# Patient Record
Sex: Female | Born: 1935 | Race: White | Hispanic: No | Smoking: Never smoker
Health system: Southern US, Community
[De-identification: ages and names within clinical notes are randomized; demographics above are authoritative.]

## PROBLEM LIST (undated history)

## (undated) DIAGNOSIS — E7800 Pure hypercholesterolemia, unspecified: Secondary | ICD-10-CM

## (undated) DIAGNOSIS — M1990 Unspecified osteoarthritis, unspecified site: Secondary | ICD-10-CM

## (undated) DIAGNOSIS — I10 Essential (primary) hypertension: Secondary | ICD-10-CM

## (undated) HISTORY — PX: EYE SURGERY: SHX253

## (undated) HISTORY — PX: ABDOMINAL HYSTERECTOMY: SHX81

## (undated) HISTORY — PX: CARDIAC CATHETERIZATION: SHX172

---

## 2000-07-12 ENCOUNTER — Encounter: Payer: Self-pay | Admitting: Emergency Medicine

## 2000-07-12 ENCOUNTER — Ambulatory Visit (HOSPITAL_COMMUNITY): Admission: AD | Admit: 2000-07-12 | Discharge: 2000-07-12 | Payer: Self-pay | Admitting: *Deleted

## 2002-10-05 ENCOUNTER — Other Ambulatory Visit: Admission: RE | Admit: 2002-10-05 | Discharge: 2002-10-05 | Payer: Self-pay | Admitting: Family Medicine

## 2003-10-26 ENCOUNTER — Other Ambulatory Visit: Admission: RE | Admit: 2003-10-26 | Discharge: 2003-10-26 | Payer: Self-pay | Admitting: *Deleted

## 2005-11-19 ENCOUNTER — Other Ambulatory Visit: Admission: RE | Admit: 2005-11-19 | Discharge: 2005-11-19 | Payer: Self-pay | Admitting: *Deleted

## 2011-12-27 ENCOUNTER — Encounter (HOSPITAL_COMMUNITY): Payer: Self-pay | Admitting: *Deleted

## 2011-12-27 ENCOUNTER — Emergency Department (HOSPITAL_COMMUNITY)
Admission: EM | Admit: 2011-12-27 | Discharge: 2011-12-27 | Disposition: A | Discharge status: Home / Self Care | Payer: Medicare Other | Attending: Emergency Medicine | Admitting: Emergency Medicine

## 2011-12-27 DIAGNOSIS — I10 Essential (primary) hypertension: Secondary | ICD-10-CM | POA: Insufficient documentation

## 2011-12-27 DIAGNOSIS — R002 Palpitations: Secondary | ICD-10-CM | POA: Insufficient documentation

## 2011-12-27 DIAGNOSIS — E7800 Pure hypercholesterolemia, unspecified: Secondary | ICD-10-CM | POA: Insufficient documentation

## 2011-12-27 HISTORY — DX: Pure hypercholesterolemia, unspecified: E78.00

## 2011-12-27 HISTORY — DX: Essential (primary) hypertension: I10

## 2011-12-27 LAB — CBC WITH DIFFERENTIAL/PLATELET
Basophils Absolute: 0.1 K/uL (ref 0.0–0.1)
Basophils Relative: 1 % (ref 0–1)
Eosinophils Absolute: 0.1 K/uL (ref 0.0–0.7)
Eosinophils Relative: 1 % (ref 0–5)
HCT: 37.7 % (ref 36.0–46.0)
Hemoglobin: 12.6 g/dL (ref 12.0–15.0)
Lymphocytes Relative: 21 % (ref 12–46)
Lymphs Abs: 1.7 K/uL (ref 0.7–4.0)
MCH: 29.3 pg (ref 26.0–34.0)
MCHC: 33.4 g/dL (ref 30.0–36.0)
MCV: 87.7 fL (ref 78.0–100.0)
Monocytes Absolute: 0.5 K/uL (ref 0.1–1.0)
Monocytes Relative: 6 % (ref 3–12)
Neutro Abs: 5.6 K/uL (ref 1.7–7.7)
Neutrophils Relative %: 71 % (ref 43–77)
Platelets: 333 K/uL (ref 150–400)
RBC: 4.3 MIL/uL (ref 3.87–5.11)
RDW: 13.3 % (ref 11.5–15.5)
WBC: 7.8 K/uL (ref 4.0–10.5)

## 2011-12-27 LAB — URINALYSIS, ROUTINE W REFLEX MICROSCOPIC
Bilirubin Urine: NEGATIVE
Glucose, UA: NEGATIVE mg/dL
Ketones, ur: NEGATIVE mg/dL
Nitrite: NEGATIVE
Protein, ur: NEGATIVE mg/dL
pH: 7 (ref 5.0–8.0)

## 2011-12-27 LAB — BASIC METABOLIC PANEL
Chloride: 105 mEq/L (ref 96–112)
GFR calc Af Amer: 90 mL/min — ABNORMAL LOW (ref >90)
GFR calc non Af Amer: 77 mL/min — ABNORMAL LOW (ref >90)
Potassium: 3.5 mEq/L (ref 3.5–5.1)
Sodium: 141 mEq/L (ref 135–145)

## 2011-12-27 LAB — URINE MICROSCOPIC-ADD ON

## 2011-12-27 LAB — TROPONIN I
Troponin I: <0.3 ng/mL (ref <0.30)
Troponin I: <0.3 ng/mL

## 2011-12-27 LAB — MAGNESIUM: Magnesium: 2.2 mg/dL (ref 1.5–2.5)

## 2011-12-27 MED ORDER — ASPIRIN 81 MG PO CHEW
162.0000 mg | CHEWABLE_TABLET | Freq: Once | ORAL | Status: AC
  Administered 2011-12-27: 162 mg via ORAL
  Filled 2011-12-27: qty 2

## 2011-12-27 MED ORDER — GI COCKTAIL ~~LOC~~
30.0000 mL | Freq: Once | ORAL | Status: AC
  Administered 2011-12-27: 30 mL via ORAL
  Filled 2011-12-27: qty 30

## 2011-12-27 MED ORDER — SODIUM CHLORIDE 0.9 % IV SOLN
Freq: Once | INTRAVENOUS | Status: AC
  Administered 2011-12-27: 12:00:00 via INTRAVENOUS

## 2011-12-27 NOTE — ED Provider Notes (Signed)
History     CSN: [id number]  Arrival date & time 12/27/11  1035   First MD Initiated Contact with Patient 12/27/11 1101      Chief Complaint  Patient presents with  . Palpitations  . Gastrophageal Reflux    (Consider location/radiation/quality/duration/timing/severity/associated sxs/prior treatment) HPI Comments: Pt comes in with cc of palpitations. Pt has no known CAD hx, hx of HTN. PT reports that this am since this am she has been having some occasional palpitations. There is no associated dizziness, sob, n/v/f/c/chest pain/sob. Pt states that in the past when she had simiar sx, she was admitted, had a CATH - which was negative, and then sent home with dx of GERD. She has no heart burn, GERD like sx at this time.   Patient is a 78 y.o. female presenting with palpitations and GERD. The history is provided by the patient.  Palpitations  Pertinent negatives include no chest pain, no abdominal pain, no nausea, no vomiting, no headaches and no shortness of breath.  Gastrophageal Reflux Pertinent negatives include no chest pain, no abdominal pain, no headaches and no shortness of breath.    Past Medical History  Diagnosis Date  . Hypertension   . High cholesterol     Past Surgical History  Procedure Date  . Eye surgery   . Abdominal hysterectomy   . Cardiac catheterization     History reviewed. No pertinent family history.  History  Substance Use Topics  . Smoking status: Never Smoker   . Smokeless tobacco: Never Used  . Alcohol Use: No    OB History    Grav Para Term Preterm Abortions TAB SAB Ect Mult Living                  Review of Systems  Constitutional: Positive for activity change.  HENT: Negative for neck pain.   Respiratory: Negative for shortness of breath.   Cardiovascular: Positive for palpitations. Negative for chest pain.  Gastrointestinal: Negative for nausea, vomiting and abdominal pain.  Genitourinary: Negative for dysuria.    Neurological: Negative for headaches.    Allergies  Review of patient's allergies indicates no known allergies.  Home Medications   Current Outpatient Rx  Name Route Sig Dispense Refill  . ALENDRONATE SODIUM 70 MG PO TABS Oral Take 70 mg by mouth every 7 (seven) days. Take with a full glass of water on an empty stomach. Saturday    . ASPIRIN 81 MG PO TABS Oral Take 81 mg by mouth daily.    Marland Kitchen CALCIUM + D3 PO Oral Take 1 tablet by mouth daily.    Marland Kitchen LISINOPRIL 10 MG PO TABS Oral Take 10 mg by mouth daily.    Marland Kitchen LOVASTATIN 20 MG PO TABS Oral Take 20 mg by mouth at bedtime.    Marland Kitchen METOPROLOL TARTRATE 50 MG PO TABS Oral Take 50 mg by mouth 2 (two) times daily.    . ADULT MULTIVITAMIN W/MINERALS CH Oral Take 1 tablet by mouth daily.    Marland Kitchen VITAMIN C 500 MG PO TABS Oral Take 500 mg by mouth daily.    Marland Kitchen VITAMIN E 400 UNITS PO CAPS Oral Take 400 Units by mouth daily.      BP 192/77  Pulse 70  Temp 98 F (36.7 C) (Oral)  Resp 18  Ht 5\' 3"  (1.6 m)  Wt 150 lb (68.04 kg)  BMI 26.57 kg/m2  SpO2 98%  Physical Exam  Nursing note and vitals reviewed. Constitutional: She is oriented to  person, place, and time. She appears well-developed and well-nourished.  HENT:  Head: Normocephalic and atraumatic.  Eyes: EOM are normal. Pupils are equal, round, and reactive to light.  Neck: Neck supple.  Cardiovascular: Normal rate and normal heart sounds.   No murmur heard.      Occasional dropped beat.  Pulmonary/Chest: Effort normal. No respiratory distress.  Abdominal: Soft. She exhibits no distension. There is no tenderness. There is no rebound and no guarding.  Neurological: She is alert and oriented to person, place, and time.  Skin: Skin is warm and dry.    ED Course  Procedures (including critical care time)  Labs Reviewed  URINALYSIS, ROUTINE W REFLEX MICROSCOPIC - Abnormal; Notable for the following:    Leukocytes, UA SMALL (*)     All other components within normal limits  BASIC  METABOLIC PANEL - Abnormal; Notable for the following:    Glucose, Bld 103 (*)     GFR calc non Af Amer 77 (*)     GFR calc Af Amer 90 (*)     All other components within normal limits  CBC WITH DIFFERENTIAL  TROPONIN I  URINE MICROSCOPIC-ADD ON  MAGNESIUM  TROPONIN I   No results found.   No diagnosis found.    MDM   Date: 12/27/2011  Rate: 71  Rhythm: normal sinus rhythm  QRS Axis: normal  Intervals: normal  ST/T Wave abnormalities: normal  Conduction Disutrbances:none  Narrative Interpretation:   Old EKG Reviewed: unchanged  Pt comes in with cc of palpitations. Asymptomatic. Hx not suggestive of any sympathomimetics on board, no new meds, and she has no associated sx. We will get basic cardiac labs, trops x 2, ekg x 2. Suggested to patient that she needs to see her pcp if labs are normal and she has persistent sx for possible holter monitoring.  While in the patient will be on cardiac monitoring, and we will see if anything abnormal is picked up.             Derwood Kaplan, MD 12/27/11 1552

## 2011-12-27 NOTE — Progress Notes (Signed)
Pt states pcp is candace [employer]

## 2011-12-27 NOTE — ED Notes (Signed)
Okay to draw 2nd Troponin at 1430 per Dr. Ileana Ladd with phlebotomy also aware.

## 2011-12-27 NOTE — ED Notes (Signed)
MD at bedside. 

## 2011-12-27 NOTE — ED Notes (Signed)
1st BMP, Magnesium and Troponin sample clotted, phlebotomy at bedside now to redraw sample, will monitor.

## 2011-12-27 NOTE — ED Notes (Signed)
Pt from home with reports of feeling like heart was fluttering with an indigestion type feeling that started around 0800 this morning that subsided about 30 minutes ago. Pt denies N/V, dizziness or shortness of breath. Pt also reports similar episode in 2002 with a negative heart cath performed at [facility]. Upon placing pt on cardiac monitor, NSR was noted, rate in the 60s and 70s.

## 2013-12-01 ENCOUNTER — Ambulatory Visit (INDEPENDENT_AMBULATORY_CARE_PROVIDER_SITE_OTHER): Payer: Medicare Other | Admitting: Family Medicine

## 2013-12-01 ENCOUNTER — Ambulatory Visit (INDEPENDENT_AMBULATORY_CARE_PROVIDER_SITE_OTHER): Payer: Medicare Other

## 2013-12-01 VITALS — BP 128/80 | HR 74 | Temp 98.4°F | Resp 16 | Ht 64.0 in | Wt 153.6 lb

## 2013-12-01 DIAGNOSIS — M79609 Pain in unspecified limb: Secondary | ICD-10-CM

## 2013-12-01 DIAGNOSIS — B351 Tinea unguium: Secondary | ICD-10-CM

## 2013-12-01 DIAGNOSIS — M79671 Pain in right foot: Secondary | ICD-10-CM

## 2013-12-01 DIAGNOSIS — M659 Synovitis and tenosynovitis, unspecified: Secondary | ICD-10-CM

## 2013-12-01 NOTE — Progress Notes (Signed)
Subjective:    Patient ID: Sylvia Petersen, female    DOB: 11-14-1929, 80 y.o.   MRN: [id number]  HPI Sylvia Petersen is a 80 y.o. female  R foot pain, past few days - top of foot. NKI, no known new activities, but did wear new shoes last week. Hurts to push foot down or lift up.   Had great toenail removed last year, ingrown and thickened.  Nail came back thickened again. Occasional soreness and trouble with keeping nail cut.   Tx: none.    There are no active problems to display for this patient.  Past Medical History  Diagnosis Date  . Hypertension   . High cholesterol    Past Surgical History  Procedure Laterality Date  . Eye surgery    . Abdominal hysterectomy    . Cardiac catheterization     No Known Allergies Prior to Admission medications   Medication Sig Start Date End Date Taking? Authorizing Provider  alendronate (FOSAMAX) 70 MG tablet Take 70 mg by mouth every 7 (seven) days. Take with a full glass of water on an empty stomach. Saturday   Yes Historical Provider, MD  amLODipine (NORVASC) 2.5 MG tablet Take 2.5 mg by mouth daily.   Yes Historical Provider, MD  aspirin 81 MG tablet Take 81 mg by mouth daily.   Yes Historical Provider, MD  Calcium Carb-Cholecalciferol (CALCIUM + D3 PO) Take 1 tablet by mouth daily.   Yes Historical Provider, MD  lisinopril (PRINIVIL,ZESTRIL) 10 MG tablet Take 20 mg by mouth daily.    Yes Historical Provider, MD  lovastatin (MEVACOR) 20 MG tablet Take 20 mg by mouth at bedtime.   Yes Historical Provider, MD  metoprolol (LOPRESSOR) 50 MG tablet Take 50 mg by mouth 2 (two) times daily.   Yes Historical Provider, MD  Multiple Vitamin (MULTIVITAMIN WITH MINERALS) TABS Take 1 tablet by mouth daily.   Yes Historical Provider, MD  vitamin C (ASCORBIC ACID) 500 MG tablet Take 500 mg by mouth daily.   Yes Historical Provider, MD  vitamin E (VITAMIN E) 400 UNIT capsule Take 400 Units by mouth daily.   Yes Historical Provider, MD   History    Social History  . Marital Status: Widowed    Spouse Name: N/A    Number of Children: N/A  . Years of Education: N/A   Occupational History  . Not on file.   Social History Main Topics  . Smoking status: Never Smoker   . Smokeless tobacco: Never Used  . Alcohol Use: No  . Drug Use: No  . Sexual Activity: Not on file   Other Topics Concern  . Not on file   Social History Narrative  . No narrative on file     Review of Systems  Constitutional: Negative for fever and chills.  Respiratory: Negative for chest tightness.   Musculoskeletal: Positive for myalgias (foot only.  no new calf pain, or calf swelling. ).  Skin: Negative for rash and wound.       Objective:   Physical Exam  Vitals reviewed. Constitutional: She is oriented to person, place, and time. She appears well-developed and well-nourished. No distress.  HENT:  Head: Normocephalic and atraumatic.  Pulmonary/Chest: Effort normal.  Musculoskeletal:       Right foot: She exhibits tenderness.       Feet:  Neurological: She is alert and oriented to person, place, and time.  Skin: Skin is warm and dry. No rash noted.  Psychiatric: She has  a normal mood and affect. Her behavior is normal.   Filed Vitals:   12/01/13 1143  BP: 128/80  Pulse: 74  Temp: 98.4 F (36.9 C)  TempSrc: Oral  Resp: 16  Height: 5\' 4"  (1.626 m)  Weight: 153 lb 9.6 oz (69.673 kg)  SpO2: 98%   [facility] reading (PRIMARY) by  Dr. Carlota Raspberry: R foot. Degenerative changes, no acute fracture.     Assessment & Plan:   Sylvia Petersen is a 80 y.o. female Foot pain, right - Plan: DG Foot Complete Right, Tibialis anterior tenosynovitis  -appears to be most ttp over tib anterior tendon, no rash, no apparent superficial thrombophlebitis, but in DDX. New shoes last week may be contributory.  -heat/ice topically as needed, rom/stretches, limit overuse, and tylenol otc prn.  Short term low dose advil for antiinflammatory effect if needed.  -recheck in  next 2 weeks if not improving.   Onychomycosis of right great toe   -keep appt with foot specialist tomorrow - may be removing nail +/- matrix destruction to prevent recurrence.   Meds ordered this encounter  Medications  . amLODipine (NORVASC) 2.5 MG tablet    Sig: Take 2.5 mg by mouth daily.   Patient Instructions  Keep appointment with foot specialist tomorrow for toenail evaluation.   Try heat to top of foot in morning, then ice over washcloth to area at night if needed. Tylenol is safer for pain, but occasional advil over the counter - lowest effective dose - can be used short term for inflammation.  Stretches and range of motion, then recheck in next 2 weeks if not improving.  Sooner if worse.   Tendinitis Tendinitis is swelling and inflammation of the tendons. Tendons are band-like tissues that connect muscle to bone. Tendinitis commonly occurs in the:   Shoulders (rotator cuff).  Heels (Achilles tendon).  Elbows (triceps tendon). CAUSES Tendinitis is usually caused by overusing the tendon, muscles, and joints involved. When the tissue surrounding a tendon (synovium) becomes inflamed, it is called tenosynovitis. Tendinitis commonly develops in people whose jobs require repetitive motions. SYMPTOMS  Pain.  Tenderness.  Mild swelling. DIAGNOSIS Tendinitis is usually diagnosed by physical exam. Your health care provider may also order X-rays or other imaging tests. TREATMENT Your health care provider may recommend certain medicines or exercises for your treatment. HOME CARE INSTRUCTIONS   Use a sling or splint for as long as directed by your health care provider until the pain decreases.  Put ice on the injured area.  Put ice in a plastic bag.  Place a towel between your skin and the bag.  Leave the ice on for 15-20 minutes, 3-4 times a day, or as directed by your health care provider.  Avoid using the limb while the tendon is painful. Perform gentle range of motion  exercises only as directed by your health care provider. Stop exercises if pain or discomfort increase, unless directed otherwise by your health care provider.  Only take over-the-counter or prescription medicines for pain, discomfort, or fever as directed by your health care provider. SEEK MEDICAL CARE IF:   Your pain and swelling increase.  You develop new, unexplained symptoms, especially increased numbness in the hands. MAKE SURE YOU:   Understand these instructions.  Will watch your condition.  Will get help right away if you are not doing well or get worse. Document Released: 02/24/2000 Document Revised: 07/13/2013 Document Reviewed: 05/15/2010 [facility] Patient Information 2015 [place], [place]. This information is not intended to replace advice given  to you by your health care provider. Make sure you discuss any questions you have with your health care provider.

## 2013-12-01 NOTE — Patient Instructions (Addendum)
Keep appointment with foot specialist tomorrow for toenail evaluation.   Try heat to top of foot in morning, then ice over washcloth to area at night if needed. Tylenol is safer for pain, but occasional advil over the counter - lowest effective dose - can be used short term for inflammation.  Stretches and range of motion, then recheck in next 2 weeks if not improving.  Sooner if worse.   Tendinitis Tendinitis is swelling and inflammation of the tendons. Tendons are band-like tissues that connect muscle to bone. Tendinitis commonly occurs in the:   Shoulders (rotator cuff).  Heels (Achilles tendon).  Elbows (triceps tendon). CAUSES Tendinitis is usually caused by overusing the tendon, muscles, and joints involved. When the tissue surrounding a tendon (synovium) becomes inflamed, it is called tenosynovitis. Tendinitis commonly develops in people whose jobs require repetitive motions. SYMPTOMS  Pain.  Tenderness.  Mild swelling. DIAGNOSIS Tendinitis is usually diagnosed by physical exam. Your health care provider may also order X-rays or other imaging tests. TREATMENT Your health care provider may recommend certain medicines or exercises for your treatment. HOME CARE INSTRUCTIONS   Use a sling or splint for as long as directed by your health care provider until the pain decreases.  Put ice on the injured area.  Put ice in a plastic bag.  Place a towel between your skin and the bag.  Leave the ice on for 15-20 minutes, 3-4 times a day, or as directed by your health care provider.  Avoid using the limb while the tendon is painful. Perform gentle range of motion exercises only as directed by your health care provider. Stop exercises if pain or discomfort increase, unless directed otherwise by your health care provider.  Only take over-the-counter or prescription medicines for pain, discomfort, or fever as directed by your health care provider. SEEK MEDICAL CARE IF:   Your pain and  swelling increase.  You develop new, unexplained symptoms, especially increased numbness in the hands. MAKE SURE YOU:   Understand these instructions.  Will watch your condition.  Will get help right away if you are not doing well or get worse. Document Released: 02/24/2000 Document Revised: 07/13/2013 Document Reviewed: 05/15/2010 [facility] Patient Information 2015 [place], [place]. This information is not intended to replace advice given to you by your health care provider. Make sure you discuss any questions you have with your health care provider.

## 2013-12-02 ENCOUNTER — Ambulatory Visit (INDEPENDENT_AMBULATORY_CARE_PROVIDER_SITE_OTHER): Payer: Medicare Other

## 2013-12-02 VITALS — BP 161/78 | HR 73 | Resp 16

## 2013-12-02 DIAGNOSIS — L600 Ingrowing nail: Secondary | ICD-10-CM

## 2013-12-02 DIAGNOSIS — B351 Tinea unguium: Secondary | ICD-10-CM

## 2013-12-02 DIAGNOSIS — L03039 Cellulitis of unspecified toe: Secondary | ICD-10-CM

## 2013-12-02 MED ORDER — CEPHALEXIN 500 MG PO CAPS: 500.0000 mg | ORAL_CAPSULE | Freq: Three times a day (TID) | ORAL | Status: DC

## 2013-12-02 NOTE — Progress Notes (Signed)
   Subjective:    Patient ID: Sylvia Petersen, female    DOB: 12/04/1929, 80 y.o.   MRN: [id number]  HPI Comments: "I have a bad toe"  Patient c/o aching 1st toe right, both borders for about a week. The lateral nail corner is thickened. She states that she has had this ingrown worked on before. She has been trimming at home. No relief.  Toe Pain       Review of Systems  HENT: Positive for sneezing.   Eyes: Positive for itching and visual disturbance.  All other systems reviewed and are negative.      Objective:   Physical Exam 80 year old female presents at this time with a complaint of ingrown nail the lateral border right great toe previously had partial nail procedure done temporarily. Well-developed well-nourished oriented x3 vital signs stable pedal pulses palpable DP postal for PT one over 4 mild varicosities noted neurologically epicritic and proprioceptive sensations intact and symmetric bilateral normal plantar response DTRs not elicited. Neurologically skin color pigment normal no open wounds ulcerations or prescription dose incurvation and erythema medial lateral nail folds right great toe. Left hallux unremarkable nails with thick brittle yellow discolored consistent with onychomycosis nails painful criptotic incurvated and tender remainder of exam unremarkable mild digital contractures HAV deformity noted bilateral       Assessment & Plan:  Assessment this time is ingrowing nail paronychia right hallux medial lateral border with onychomycosis at this time recommendation is permanent nail excision and phenol matricectomy local anesthetic block is administered Betadine prep was performed the medial lateral borders were excised phenol matricectomy followed on-call wash Betadine ointment and dry sterile dressing applied to the right great toe per prescription for cephalexin is given recommended Tylenol as needed for pain recheck in 2 weeks for nail check as instructed.  Next  Harriet Masson DPM

## 2013-12-02 NOTE — Patient Instructions (Signed)

## 2013-12-15 ENCOUNTER — Ambulatory Visit: Payer: Medicare Other

## 2013-12-29 ENCOUNTER — Ambulatory Visit (INDEPENDENT_AMBULATORY_CARE_PROVIDER_SITE_OTHER): Payer: Medicare Other

## 2013-12-29 VITALS — BP 156/76 | HR 76 | Resp 15

## 2013-12-29 DIAGNOSIS — Z09 Encounter for follow-up examination after completed treatment for conditions other than malignant neoplasm: Secondary | ICD-10-CM

## 2013-12-29 DIAGNOSIS — L600 Ingrowing nail: Secondary | ICD-10-CM

## 2013-12-29 NOTE — Patient Instructions (Signed)

## 2013-12-29 NOTE — Progress Notes (Signed)
   Subjective:    Patient ID: Sylvia Petersen, female    DOB: [birth year], 80 y.o.   MRN: [id number]  HPI Comments: Pt presents for follow up of right 1st toenail matrixectomy.  Pt states the toe is still tender.     Review of Systems no new findings or systemic changes no    Objective:   Physical Exam Patient is 3 weeks status post AP nail procedure mediolateral borders right great toe minimal or no pain no discomfort slight serous drainage still present slight edema and erythema consistent with postop course no ascending cellulitis no signs of infection no discharge drainage otherwise noted       Assessment & Plan:  Assessment good postop progress following AP nail procedure medial lateral border of left great of right great toe. Patient will continue with cleansing daily Neosporin Band-Aid where dry at night discharge to an as-needed basis for any future followup contact us visiting changes or exacerbations  Harriet Masson DPM

## 2015-05-26 DIAGNOSIS — L820 Inflamed seborrheic keratosis: Secondary | ICD-10-CM | POA: Diagnosis not present

## 2015-05-26 DIAGNOSIS — D692 Other nonthrombocytopenic purpura: Secondary | ICD-10-CM | POA: Diagnosis not present

## 2015-06-29 DIAGNOSIS — N760 Acute vaginitis: Secondary | ICD-10-CM | POA: Diagnosis not present

## 2015-07-04 DIAGNOSIS — E7800 Pure hypercholesterolemia, unspecified: Secondary | ICD-10-CM | POA: Diagnosis not present

## 2015-07-04 DIAGNOSIS — R634 Abnormal weight loss: Secondary | ICD-10-CM | POA: Diagnosis not present

## 2015-07-04 DIAGNOSIS — I10 Essential (primary) hypertension: Secondary | ICD-10-CM | POA: Diagnosis not present

## 2015-07-26 DIAGNOSIS — H1045 Other chronic allergic conjunctivitis: Secondary | ICD-10-CM | POA: Diagnosis not present

## 2015-08-24 DIAGNOSIS — M85852 Other specified disorders of bone density and structure, left thigh: Secondary | ICD-10-CM | POA: Diagnosis not present

## 2015-08-24 DIAGNOSIS — Z1231 Encounter for screening mammogram for malignant neoplasm of breast: Secondary | ICD-10-CM | POA: Diagnosis not present

## 2015-08-24 DIAGNOSIS — M85851 Other specified disorders of bone density and structure, right thigh: Secondary | ICD-10-CM | POA: Diagnosis not present

## 2015-09-09 DIAGNOSIS — H524 Presbyopia: Secondary | ICD-10-CM | POA: Diagnosis not present

## 2015-12-28 DIAGNOSIS — N819 Female genital prolapse, unspecified: Secondary | ICD-10-CM | POA: Diagnosis not present

## 2016-01-04 DIAGNOSIS — Z1389 Encounter for screening for other disorder: Secondary | ICD-10-CM | POA: Diagnosis not present

## 2016-01-04 DIAGNOSIS — Z0001 Encounter for general adult medical examination with abnormal findings: Secondary | ICD-10-CM | POA: Diagnosis not present

## 2016-01-04 DIAGNOSIS — I10 Essential (primary) hypertension: Secondary | ICD-10-CM | POA: Diagnosis not present

## 2016-01-04 DIAGNOSIS — K4190 Unilateral femoral hernia, without obstruction or gangrene, not specified as recurrent: Secondary | ICD-10-CM | POA: Diagnosis not present

## 2016-01-04 DIAGNOSIS — N8110 Cystocele, unspecified: Secondary | ICD-10-CM | POA: Diagnosis not present

## 2016-01-04 DIAGNOSIS — E7800 Pure hypercholesterolemia, unspecified: Secondary | ICD-10-CM | POA: Diagnosis not present

## 2016-02-08 DIAGNOSIS — K439 Ventral hernia without obstruction or gangrene: Secondary | ICD-10-CM | POA: Diagnosis not present

## 2016-03-21 DIAGNOSIS — R300 Dysuria: Secondary | ICD-10-CM | POA: Diagnosis not present

## 2016-04-02 DIAGNOSIS — R944 Abnormal results of kidney function studies: Secondary | ICD-10-CM | POA: Diagnosis not present

## 2016-04-02 DIAGNOSIS — E7800 Pure hypercholesterolemia, unspecified: Secondary | ICD-10-CM | POA: Diagnosis not present

## 2016-04-02 DIAGNOSIS — Z01810 Encounter for preprocedural cardiovascular examination: Secondary | ICD-10-CM | POA: Diagnosis not present

## 2016-04-02 DIAGNOSIS — I10 Essential (primary) hypertension: Secondary | ICD-10-CM | POA: Diagnosis not present

## 2016-04-13 ENCOUNTER — Other Ambulatory Visit: Payer: Self-pay | Admitting: Obstetrics and Gynecology

## 2016-04-17 NOTE — Patient Instructions (Addendum)
Your procedure is scheduled on:  Wednesday, Feb. 14, 2018  Enter through the [employer] [facility] at:  7:00 AM  Pick up the phone at the desk and dial [phone].  Call this number if you have problems the morning of surgery: [phone].  Remember: Do NOT eat food or drink after:  Midnight Tuesday  Take these medicines the morning of surgery with a SIP OF WATER:  Amlodipine, Metoprolol  Stop ALL herbal medications, Vitamin C and Vitamin E at this time  Do NOT smoke the day of surgery.  Do NOT wear jewelry (body piercing), metal hair clips/bobby pins, make-up, or nail polish. Do NOT wear lotions, powders, or perfumes.  You may wear deodorant. Do NOT shave for 48 hours prior to surgery. Do NOT bring valuables to the hospital. Contacts, dentures, or bridgework may not be worn into surgery.  Leave suitcase in car.  After surgery it may be brought to your room.  For patients admitted to the hospital, checkout time is 11:00 AM the day of discharge.  Have a responsible adult drive you home and stay with you for 24 hours after your procedure  Bring a copy of your healthcare power of attorney and living will documents.  **Effective Friday, Jan. 12, 2018, [facility] will implement no hospital visitations from children age 33 and younger due to a steady increase in flu activity in our community and hospitals. **

## 2016-04-18 ENCOUNTER — Encounter (HOSPITAL_COMMUNITY): Payer: Self-pay

## 2016-04-18 ENCOUNTER — Encounter (HOSPITAL_COMMUNITY)
Admission: RE | Admit: 2016-04-18 | Discharge: 2016-04-18 | Disposition: A | Discharge status: Home / Self Care | Payer: PPO | Source: Ambulatory Visit | Attending: Obstetrics and Gynecology | Admitting: Obstetrics and Gynecology

## 2016-04-18 DIAGNOSIS — Z9889 Other specified postprocedural states: Secondary | ICD-10-CM | POA: Diagnosis not present

## 2016-04-18 DIAGNOSIS — Z01812 Encounter for preprocedural laboratory examination: Secondary | ICD-10-CM | POA: Insufficient documentation

## 2016-04-18 HISTORY — DX: Unspecified osteoarthritis, unspecified site: M19.90

## 2016-04-18 LAB — CBC
HCT: 33.6 % — ABNORMAL LOW (ref 36.0–46.0)
Hemoglobin: 11 g/dL — ABNORMAL LOW (ref 12.0–15.0)
MCH: 28.7 pg (ref 26.0–34.0)
MCHC: 32.7 g/dL (ref 30.0–36.0)
MCV: 87.7 fL (ref 78.0–100.0)
PLATELETS: 265 10*3/uL (ref 150–400)
RBC: 3.83 MIL/uL — ABNORMAL LOW (ref 3.87–5.11)
RDW: 13.4 % (ref 11.5–15.5)
WBC: 6.5 10*3/uL (ref 4.0–10.5)

## 2016-04-24 NOTE — Anesthesia Preprocedure Evaluation (Addendum)
Anesthesia Evaluation  Patient identified by MRN, date of birth, ID band Patient awake    Reviewed: Allergy & Precautions, H&P , Patient's Chart, lab work & pertinent test results, reviewed documented beta blocker date and time   Airway Mallampati: II  TM Distance: >3 FB Neck ROM: full    Dental no notable dental hx.    Pulmonary neg pulmonary ROS,    Pulmonary exam normal breath sounds clear to auscultation       Cardiovascular hypertension, Pt. on medications and Pt. on home beta blockers negative cardio ROS   Rhythm:regular Rate:Normal     Neuro/Psych negative neurological ROS     GI/Hepatic negative GI ROS, Neg liver ROS,   Endo/Other  negative endocrine ROS  Renal/GU negative Renal ROS     Musculoskeletal negative musculoskeletal ROS (+)   Abdominal   Peds  Hematology negative hematology ROS (+)   Anesthesia Other Findings Day of surgery medications reviewed with the patient. Limited neck motion and decreased ROM of neck Plan SAB  Reproductive/Obstetrics                            Anesthesia Physical Anesthesia Plan  ASA: III  Anesthesia Plan: Spinal   Post-op Pain Management:    Induction:   Airway Management Planned: Simple Face Mask and Natural Airway  Additional Equipment:   Intra-op Plan:   Post-operative Plan: Extubation in OR  Informed Consent: I have reviewed the patients History and Physical, chart, labs and discussed the procedure including the risks, benefits and alternatives for the proposed anesthesia with the patient or authorized representative who has indicated his/her understanding and acceptance.   Dental Advisory Given  Plan Discussed with: CRNA  Anesthesia Plan Comments: (Lab work and procedure  confirmed with CRNA in room; platelets okay. Discussed spinal anesthetic, and patient consents to the procedure:  included risk of possible  headache,backache, failed block, allergic reaction, and nerve injury. This patient was asked if she had any questions or concerns before the procedure started.  )       Anesthesia Quick Evaluation

## 2016-04-24 NOTE — H&P (Signed)
83 y.o. yo complains of debilitating cystocele and vaginal prolapse.  For 10 years pt has worn pessary but in last 2 years was having significant problems with cleaning process- the donut pessary was very painful to remove and reinsert.  We then tried multiple other ones and unfortunately the did not do well.  Finally we had used the inflatable ball for the last 6 months.  Although it was holding well, the ball was unable to be changed but once a month and began to cause vaginal excoriation, foul d/c and bleeding.  She is very uncomfortable without the pessary but has left it out for about 4 weeks to allow the vagina to heal.  She has used premarin cream during this time to help strengthen the tissues.  She desires definitive treatment.  Past Medical History:  Diagnosis Date  . Arthritis   . High cholesterol   . Hypertension    Past Surgical History:  Procedure Laterality Date  . ABDOMINAL HYSTERECTOMY    . CARDIAC CATHETERIZATION    . EYE SURGERY      Social History   Social History  . Marital status: Widowed    Spouse name: N/A  . Number of children: N/A  . Years of education: N/A   Occupational History  . Not on file.   Social History Main Topics  . Smoking status: Never Smoker  . Smokeless tobacco: Never Used  . Alcohol use No  . Drug use: No  . Sexual activity: Not on file   Other Topics Concern  . Not on file   Social History Narrative  . No narrative on file    No current facility-administered medications on file prior to encounter.    Current Outpatient Prescriptions on File Prior to Encounter  Medication Sig Dispense Refill  . amLODipine (NORVASC) 2.5 MG tablet Take 2.5 mg by mouth daily.    . Calcium Carb-Cholecalciferol (CALCIUM + D3 PO) Take 1 tablet by mouth daily.    Marland Kitchen lovastatin (MEVACOR) 20 MG tablet Take 20 mg by mouth daily at 6 PM.     . metoprolol (LOPRESSOR) 50 MG tablet Take 50 mg by mouth 2 (two) times daily.    . Multiple Vitamin (MULTIVITAMIN  WITH MINERALS) TABS Take 1 tablet by mouth daily.    . vitamin C (ASCORBIC ACID) 500 MG tablet Take 500 mg by mouth daily.    . vitamin E (VITAMIN E) 400 UNIT capsule Take 400 Units by mouth daily.    Marland Kitchen alendronate (FOSAMAX) 70 MG tablet Take 70 mg by mouth every 7 (seven) days. Take with a full glass of water on an empty stomach. Saturday    . aspirin 81 MG tablet Take 81 mg by mouth daily. Has stopped prior to procedure    . cephALEXin (KEFLEX) 500 MG capsule Take 1 capsule (500 mg total) by mouth 3 (three) times daily. (Patient not taking: Reported on 04/13/2016) 30 capsule 0    No Known Allergies  @VITALS2 @  Lungs: clear to ascultation Cor:  RRR Abdomen:  soft, nontender, nondistended. Ex:  no cords, erythema Pelvic:  Absent uterus.  Cuff intact and some decensus. Cystocele comes out of body with standing.  Urethra hybpermobile.  No masses.  A:  Long standing and severe cystocele which causes her to have to splint to urinate when pessary is out.  Unable to find a pessary which can be removed and cleaned without pain or excoriation of vagina.  Will do A repair after TVT for  expected incontinence when A repair is done.    P: All risks, benefits and alternatives d/w patient and she desires to proceed.  Patient will receive SCDs during the operation.     Clarece Drzewiecki A

## 2016-04-25 ENCOUNTER — Ambulatory Visit (HOSPITAL_COMMUNITY)
Admission: RE | Admit: 2016-04-25 | Discharge: 2016-04-26 | Disposition: A | Discharge status: Home / Self Care | Payer: PPO | Source: Ambulatory Visit | Attending: Obstetrics and Gynecology | Admitting: Obstetrics and Gynecology

## 2016-04-25 ENCOUNTER — Ambulatory Visit (HOSPITAL_COMMUNITY): Payer: PPO | Admitting: Anesthesiology

## 2016-04-25 ENCOUNTER — Encounter (HOSPITAL_COMMUNITY)
Admission: RE | Disposition: A | Discharge status: Home / Self Care | Payer: Self-pay | Source: Ambulatory Visit | Attending: Obstetrics and Gynecology

## 2016-04-25 ENCOUNTER — Encounter (HOSPITAL_COMMUNITY): Payer: Self-pay | Admitting: Certified Registered Nurse Anesthetist

## 2016-04-25 DIAGNOSIS — Z90710 Acquired absence of both cervix and uterus: Secondary | ICD-10-CM | POA: Diagnosis not present

## 2016-04-25 DIAGNOSIS — Z9889 Other specified postprocedural states: Secondary | ICD-10-CM

## 2016-04-25 DIAGNOSIS — Z7982 Long term (current) use of aspirin: Secondary | ICD-10-CM | POA: Insufficient documentation

## 2016-04-25 DIAGNOSIS — I10 Essential (primary) hypertension: Secondary | ICD-10-CM | POA: Diagnosis not present

## 2016-04-25 DIAGNOSIS — N819 Female genital prolapse, unspecified: Secondary | ICD-10-CM | POA: Diagnosis not present

## 2016-04-25 DIAGNOSIS — N8111 Cystocele, midline: Secondary | ICD-10-CM | POA: Insufficient documentation

## 2016-04-25 DIAGNOSIS — M1990 Unspecified osteoarthritis, unspecified site: Secondary | ICD-10-CM | POA: Insufficient documentation

## 2016-04-25 DIAGNOSIS — E7800 Pure hypercholesterolemia, unspecified: Secondary | ICD-10-CM | POA: Insufficient documentation

## 2016-04-25 DIAGNOSIS — N8110 Cystocele, unspecified: Secondary | ICD-10-CM | POA: Diagnosis not present

## 2016-04-25 DIAGNOSIS — Z79899 Other long term (current) drug therapy: Secondary | ICD-10-CM | POA: Diagnosis not present

## 2016-04-25 DIAGNOSIS — R32 Unspecified urinary incontinence: Secondary | ICD-10-CM | POA: Insufficient documentation

## 2016-04-25 HISTORY — PX: BLADDER SUSPENSION: SHX72

## 2016-04-25 HISTORY — PX: CYSTOSCOPY: SHX5120

## 2016-04-25 HISTORY — PX: CYSTOCELE REPAIR: SHX163

## 2016-04-25 SURGERY — URETHROPEXY, USING TRANSVAGINAL TAPE
Anesthesia: General | Site: Vagina

## 2016-04-25 MED ORDER — OXYCODONE-ACETAMINOPHEN 5-325 MG PO TABS: 1.0000 | ORAL_TABLET | ORAL | Status: DC | PRN

## 2016-04-25 MED ORDER — METOPROLOL TARTRATE 50 MG PO TABS
50.0000 mg | ORAL_TABLET | Freq: Two times a day (BID) | ORAL | Status: DC
  Filled 2016-04-25 (×2): qty 1

## 2016-04-25 MED ORDER — KETOROLAC TROMETHAMINE 30 MG/ML IJ SOLN
30.0000 mg | Freq: Four times a day (QID) | INTRAMUSCULAR | Status: DC
  Administered 2016-04-25: 30 mg via INTRAVENOUS

## 2016-04-25 MED ORDER — ONDANSETRON HCL 4 MG PO TABS: 4.0000 mg | ORAL_TABLET | Freq: Four times a day (QID) | ORAL | Status: DC | PRN

## 2016-04-25 MED ORDER — DEXAMETHASONE SODIUM PHOSPHATE 4 MG/ML IJ SOLN
INTRAMUSCULAR | Status: AC
  Filled 2016-04-25: qty 1

## 2016-04-25 MED ORDER — HYDROMORPHONE HCL 1 MG/ML IJ SOLN
INTRAMUSCULAR | Status: AC
  Administered 2016-04-25: 0.25 mg via INTRAVENOUS
  Filled 2016-04-25: qty 1

## 2016-04-25 MED ORDER — HYDROMORPHONE HCL 1 MG/ML IJ SOLN
0.2500 mg | INTRAMUSCULAR | Status: DC | PRN
  Administered 2016-04-25: 0.25 mg via INTRAVENOUS

## 2016-04-25 MED ORDER — AMLODIPINE BESYLATE 5 MG PO TABS
2.5000 mg | ORAL_TABLET | Freq: Every day | ORAL | Status: DC
  Administered 2016-04-26: 2.5 mg via ORAL

## 2016-04-25 MED ORDER — STERILE WATER FOR IRRIGATION IR SOLN
Status: DC | PRN
  Administered 2016-04-25: 500 mL

## 2016-04-25 MED ORDER — ONDANSETRON HCL 4 MG/2ML IJ SOLN: 4.0000 mg | Freq: Once | INTRAMUSCULAR | Status: DC | PRN

## 2016-04-25 MED ORDER — MIDAZOLAM HCL 2 MG/2ML IJ SOLN
INTRAMUSCULAR | Status: DC | PRN
  Administered 2016-04-25 (×2): 0.5 mg via INTRAVENOUS

## 2016-04-25 MED ORDER — LIDOCAINE HCL (CARDIAC) 20 MG/ML IV SOLN
INTRAVENOUS | Status: DC | PRN
  Administered 2016-04-25: 30 mg via INTRAVENOUS

## 2016-04-25 MED ORDER — LIDOCAINE-EPINEPHRINE 0.5 %-1:200000 IJ SOLN
INTRAMUSCULAR | Status: DC | PRN
  Administered 2016-04-25: 7 mL

## 2016-04-25 MED ORDER — FENTANYL CITRATE (PF) 100 MCG/2ML IJ SOLN
INTRAMUSCULAR | Status: AC
  Filled 2016-04-25: qty 2

## 2016-04-25 MED ORDER — MENTHOL 3 MG MT LOZG: 1.0000 | LOZENGE | OROMUCOSAL | Status: DC | PRN

## 2016-04-25 MED ORDER — ONDANSETRON HCL 4 MG/2ML IJ SOLN
INTRAMUSCULAR | Status: DC | PRN
Start: 2016-04-25 — End: 2016-04-25
  Administered 2016-04-25: 4 mg via INTRAVENOUS

## 2016-04-25 MED ORDER — ESTRADIOL 0.1 MG/GM VA CREA
TOPICAL_CREAM | VAGINAL | Status: AC
  Filled 2016-04-25: qty 42.5

## 2016-04-25 MED ORDER — IBUPROFEN 800 MG PO TABS
800.0000 mg | ORAL_TABLET | Freq: Three times a day (TID) | ORAL | Status: DC | PRN
  Administered 2016-04-26: 800 mg via ORAL
  Filled 2016-04-25: qty 1

## 2016-04-25 MED ORDER — LIDOCAINE-EPINEPHRINE 0.5 %-1:200000 IJ SOLN
INTRAMUSCULAR | Status: AC
  Filled 2016-04-25: qty 1

## 2016-04-25 MED ORDER — ONDANSETRON HCL 4 MG/2ML IJ SOLN
INTRAMUSCULAR | Status: AC
  Filled 2016-04-25: qty 2

## 2016-04-25 MED ORDER — METOPROLOL TARTRATE 50 MG PO TABS
50.0000 mg | ORAL_TABLET | Freq: Two times a day (BID) | ORAL | Status: DC
  Administered 2016-04-25 – 2016-04-26 (×2): 50 mg via ORAL
  Filled 2016-04-25 (×4): qty 1

## 2016-04-25 MED ORDER — PROMETHAZINE HCL 25 MG/ML IJ SOLN: 6.2500 mg | INTRAMUSCULAR | Status: DC | PRN

## 2016-04-25 MED ORDER — ASPIRIN EC 81 MG PO TBEC
81.0000 mg | DELAYED_RELEASE_TABLET | Freq: Every day | ORAL | Status: DC
  Administered 2016-04-26: 81 mg via ORAL
  Filled 2016-04-25 (×2): qty 1

## 2016-04-25 MED ORDER — LACTATED RINGERS IV SOLN
INTRAVENOUS | Status: DC
  Administered 2016-04-25: 125 mL/h via INTRAVENOUS

## 2016-04-25 MED ORDER — MEPERIDINE HCL 25 MG/ML IJ SOLN: 6.2500 mg | INTRAMUSCULAR | Status: DC | PRN

## 2016-04-25 MED ORDER — MIDAZOLAM HCL 2 MG/2ML IJ SOLN
INTRAMUSCULAR | Status: AC
  Filled 2016-04-25: qty 2

## 2016-04-25 MED ORDER — FENTANYL CITRATE (PF) 100 MCG/2ML IJ SOLN: 25.0000 ug | INTRAMUSCULAR | Status: DC | PRN

## 2016-04-25 MED ORDER — ONDANSETRON HCL 4 MG/2ML IJ SOLN: 4.0000 mg | Freq: Four times a day (QID) | INTRAMUSCULAR | Status: DC | PRN

## 2016-04-25 MED ORDER — LISINOPRIL 20 MG PO TABS
20.0000 mg | ORAL_TABLET | Freq: Every day | ORAL | Status: DC
  Administered 2016-04-25 – 2016-04-26 (×2): 20 mg via ORAL
  Filled 2016-04-25 (×3): qty 1

## 2016-04-25 MED ORDER — ESTRADIOL 0.1 MG/GM VA CREA
TOPICAL_CREAM | VAGINAL | Status: DC | PRN
  Administered 2016-04-25: 1 via VAGINAL

## 2016-04-25 MED ORDER — KETOROLAC TROMETHAMINE 30 MG/ML IJ SOLN
INTRAMUSCULAR | Status: AC
  Administered 2016-04-25: 30 mg via INTRAVENOUS
  Filled 2016-04-25: qty 1

## 2016-04-25 MED ORDER — PROPOFOL 10 MG/ML IV BOLUS
INTRAVENOUS | Status: AC
  Filled 2016-04-25: qty 20

## 2016-04-25 MED ORDER — PROPOFOL 500 MG/50ML IV EMUL
INTRAVENOUS | Status: DC | PRN
  Administered 2016-04-25: 50 ug/kg/min via INTRAVENOUS

## 2016-04-25 MED ORDER — METHYLENE BLUE 0.5 % INJ SOLN
INTRAVENOUS | Status: AC
  Filled 2016-04-25: qty 10

## 2016-04-25 SURGICAL SUPPLY — 33 items
ADH SKN CLS APL DERMABOND .7 (GAUZE/BANDAGES/DRESSINGS) ×2
BLADE SURG 15 STRL LF C SS BP (BLADE) ×2 IMPLANT
BLADE SURG 15 STRL SS (BLADE) ×4
CANISTER SUCT 3000ML (MISCELLANEOUS) ×4 IMPLANT
CLOTH BEACON ORANGE TIMEOUT ST (SAFETY) ×4 IMPLANT
CONT PATH 16OZ SNAP LID 3702 (MISCELLANEOUS) IMPLANT
DECANTER SPIKE VIAL GLASS SM (MISCELLANEOUS) ×4 IMPLANT
DERMABOND ADVANCED (GAUZE/BANDAGES/DRESSINGS) ×2
DERMABOND ADVANCED .7 DNX12 (GAUZE/BANDAGES/DRESSINGS) ×2 IMPLANT
GAUZE PACKING 1 X5 YD ST (GAUZE/BANDAGES/DRESSINGS) ×2 IMPLANT
GAUZE PACKING 2X5 YD STRL (GAUZE/BANDAGES/DRESSINGS) ×4 IMPLANT
GLOVE BIO SURGEON STRL SZ7 (GLOVE) ×4 IMPLANT
GLOVE BIOGEL PI IND STRL 7.0 (GLOVE) ×2 IMPLANT
GLOVE BIOGEL PI INDICATOR 7.0 (GLOVE) ×2
GOWN STRL REUS W/TWL LRG LVL3 (GOWN DISPOSABLE) ×8 IMPLANT
LEGGING LITHOTOMY PAIR STRL (DRAPES) ×2 IMPLANT
MARKER SKIN DUAL TIP RULER LAB (MISCELLANEOUS) IMPLANT
NEEDLE HYPO 22GX1.5 SAFETY (NEEDLE) ×4 IMPLANT
NS IRRIG 1000ML POUR BTL (IV SOLUTION) ×4 IMPLANT
PACK VAGINAL WOMENS (CUSTOM PROCEDURE TRAY) ×4 IMPLANT
SET CYSTO W/LG BORE CLAMP LF (SET/KITS/TRAYS/PACK) ×4 IMPLANT
SLING TRANS VAGINAL TAPE (Sling) ×2 IMPLANT
SLING UTERINE/ABD GYNECARE TVT (Sling) ×2 IMPLANT
SURGIFLO W/THROMBIN 8M KIT (HEMOSTASIS) IMPLANT
SUT VIC AB 0 CT1 18XCR BRD8 (SUTURE) ×2 IMPLANT
SUT VIC AB 0 CT1 8-18 (SUTURE) ×4
SUT VIC AB 2-0 CT1 27 (SUTURE) ×4
SUT VIC AB 2-0 CT1 TAPERPNT 27 (SUTURE) ×2 IMPLANT
SUT VIC AB 2-0 UR6 27 (SUTURE) IMPLANT
SYR 50ML LL SCALE MARK (SYRINGE) IMPLANT
TOWEL OR 17X24 6PK STRL BLUE (TOWEL DISPOSABLE) ×8 IMPLANT
TRAY FOLEY CATH SILVER 14FR (SET/KITS/TRAYS/PACK) ×4 IMPLANT
WATER STERILE IRR 1000ML POUR (IV SOLUTION) ×4 IMPLANT

## 2016-04-25 NOTE — Progress Notes (Signed)
There has been no change in the patients history, status or exam since the history and physical.  Vitals:   04/25/16 0715 04/25/16 0753  BP: (!) 182/74 (!) 165/71  Pulse: (!) 59   Resp: (!) 6   Temp: 97.5 F (36.4 C)   TempSrc: Oral   SpO2: 100%     Lab Results  Component Value Date   WBC 6.5 04/18/2016   HGB 11.0 (L) 04/18/2016   HCT 33.6 (L) 04/18/2016   MCV 87.7 04/18/2016   PLT 265 04/18/2016    Grey Rakestraw A

## 2016-04-25 NOTE — Brief Op Note (Signed)
04/25/2016  9:47 AM  PATIENT:  Sylvia Petersen  83 y.o. female  PRE-OPERATIVE DIAGNOSIS:  PROLAPSE, severe cystocele  POST-OPERATIVE DIAGNOSIS:  PROLAPSE, severe cystocele  PROCEDURE:  Procedure(s): TRANSVAGINAL TAPE (TVT) PROCEDURE (N/A) CYSTOSCOPY (N/A) ANTERIOR REPAIR (CYSTOCELE) (N/A)  SURGEON:  Surgeon(s) and Role:    * Bobbye Charleston, MD - Primary  ASSISTANTS: Jerelyn Charles, MD   ANESTHESIA:   spinal  EBL:  Total I/O In: 600 [I.V.:600] Out: 250 [Urine:200; Blood:50]   LOCAL MEDICATIONS USED:  LIDOCAINE, surgiflo, estrace cream  SPECIMEN:  No Specimen  DISPOSITION OF SPECIMEN:  N/A  COUNTS:  YES  DICTATION: .Note written in EPIC  PLAN OF CARE: Admit for overnight observation  PATIENT DISPOSITION:  PACU - hemodynamically stable.   Delay start of Pharmacological VTE agent (>24hrs) due to surgical blood loss or risk of bleeding: not applicable  After spinal anesthesia was administered, the patient was prepped and draped in the usual sterile fashion.   A foley was placed in the urethra and the apex of the cystocele grasped with two allises.  Allises were used to grasp the vaginal mucosa in the midline superior to inferior just below the urethral opening.  The mucosa was injected with 1% lidocaine with epi in the midline and a scalpel used to incise the vaginal mucosa vertically.  Allises were used to retract the vaginal mucosa as the vesico-vaginal fascia was removed from the mucosa with blunt and sharp dissection and adequate room midurethral to pelvic floor bilaterally was developed.  Two small puncture incisions were then made two cm from midline just above the pubic symphysis.  The abdominal needles from the Gynecare TVT set were introduced behind the pubic bone, through the pelvic floor and out the vagina carefully on each respective side, being careful to hug the posterior of the pubic bone.  The foley was removed and indigo carmine given.  Cystoscopy revealed  excellent bilateral spill from each ureteral orifice and NO NEEDLES IN THE BLADDER.   The urethra was clear as well.  The foley was replaced and the vaginal needles attached to the abdominal needles.  The tape was pulled into place through the abdominal incisions, the sheaths removed while a kelly was in place under the mesh sling and the tape cut at the level of just below the skin.  The tape was checked and found to be tight enough and laying flat.   Surgiflo  was placed in the corners up by the pelvic floor to achieve hemostasis of some small bleeders out of reach and too close to the sling for stitches.  Once hemostasis was achieved, five mattress stitches of 0-vicryl were placed to close the vesico-vaginal fascia under the bladder in a double layer closure.  The vaginal mucosa was trimmed and then closed with a running lock stitch of 2-0 vicryl.  A vaginal packing with estrace was placed and the patient returned to the recovery room in stable condition.

## 2016-04-25 NOTE — Op Note (Signed)
04/25/2016  9:47 AM  PATIENT:  Sylvia Petersen  83 y.o. female  PRE-OPERATIVE DIAGNOSIS:  PROLAPSE, severe cystocele  POST-OPERATIVE DIAGNOSIS:  PROLAPSE, severe cystocele  PROCEDURE:  Procedure(s): TRANSVAGINAL TAPE (TVT) PROCEDURE (N/A) CYSTOSCOPY (N/A) ANTERIOR REPAIR (CYSTOCELE) (N/A)  SURGEON:  Surgeon(s) and Role:    * Bobbye Charleston, MD - Primary  ASSISTANTS: Jerelyn Charles, MD   ANESTHESIA:   spinal  EBL:  Total I/O In: 600 [I.V.:600] Out: 250 [Urine:200; Blood:50]   LOCAL MEDICATIONS USED:  LIDOCAINE, surgiflo, estrace cream  SPECIMEN:  No Specimen  DISPOSITION OF SPECIMEN:  N/A  COUNTS:  YES  DICTATION: .Note written in EPIC  PLAN OF CARE: Admit for overnight observation  PATIENT DISPOSITION:  PACU - hemodynamically stable.  Complications: none.  Findings: large midline cystocele, healing vaginal mucosa at apex of vagina, well suspended vaginal cuff, no rectocele.   Delay start of Pharmacological VTE agent (>24hrs) due to surgical blood loss or risk of bleeding: not applicable  After spinal anesthesia was administered, the patient was prepped and draped in the usual sterile fashion.   A foley was placed in the urethra and the apex of the cystocele grasped with two allises.  Allises were used to grasp the vaginal mucosa in the midline superior to inferior just below the urethral opening.  The mucosa was injected with 1% lidocaine with epi in the midline and a scalpel used to incise the vaginal mucosa vertically.  Allises were used to retract the vaginal mucosa as the vesico-vaginal fascia was removed from the mucosa with blunt and sharp dissection and adequate room midurethral to pelvic floor bilaterally was developed.  Two small puncture incisions were then made two cm from midline just above the pubic symphysis.  The abdominal needles from the Gynecare TVT set were introduced behind the pubic bone, through the pelvic floor and out the vagina carefully on each  respective side, being careful to hug the posterior of the pubic bone.  The foley was removed and indigo carmine given.  Cystoscopy revealed excellent bilateral spill from each ureteral orifice and NO NEEDLES IN THE BLADDER.   The urethra was clear as well.  The foley was replaced and the vaginal needles attached to the abdominal needles.  The tape was pulled into place through the abdominal incisions, the sheaths removed while a kelly was in place under the mesh sling and the tape cut at the level of just below the skin.  The tape was checked and found to be tight enough and laying flat.   Surgiflo  was placed in the corners up by the pelvic floor to achieve hemostasis of some small bleeders out of reach and too close to the sling for stitches.  Once hemostasis was achieved, five mattress stitches of 0-vicryl were placed to close the vesico-vaginal fascia under the bladder in a double layer closure.  The vaginal mucosa was trimmed and then closed with a running lock stitch of 2-0 vicryl.  A vaginal packing with estrace was placed and the patient returned to the recovery room in stable condition.

## 2016-04-25 NOTE — Anesthesia Procedure Notes (Signed)
Spinal  Patient location during procedure: OR Staffing Anesthesiologist: Lyndle Herrlich Preanesthetic Checklist Completed: patient identified, site marked, surgical consent, pre-op evaluation, timeout performed, IV checked, risks and benefits discussed and monitors and equipment checked Spinal Block Patient position: sitting Prep: DuraPrep Patient monitoring: heart rate, cardiac monitor, continuous pulse ox and blood pressure Approach: midline Location: L3-4 Injection technique: single-shot Needle Needle type: Sprotte  Needle gauge: 24 G Needle length: 9 cm Assessment Sensory level: T6 Additional Notes Spinal Dosage in OR  Bupivicaine ml       1.2

## 2016-04-25 NOTE — Transfer of Care (Signed)
Immediate Anesthesia Transfer of Care Note  Patient: Sylvia Petersen  Procedure(s) Performed: Procedure(s): TRANSVAGINAL TAPE (TVT) PROCEDURE (N/A) CYSTOSCOPY (N/A) ANTERIOR REPAIR (CYSTOCELE) (N/A)  Patient Location: PACU  Anesthesia Type:Spinal  Level of Consciousness: awake, alert  and oriented  Airway & Oxygen Therapy: Patient Spontanous Breathing and Patient connected to nasal cannula oxygen  Post-op Assessment: Report given to RN and Post -op Vital signs reviewed and stable  Post vital signs: Reviewed and stable  Last Vitals:  Vitals:   04/25/16 0715 04/25/16 0753  BP: (!) 182/74 (!) 165/71  Pulse: (!) 59   Resp: (!) 6   Temp: 36.4 C     Last Pain:  Vitals:   04/25/16 0715  TempSrc: Oral      Patients Stated Pain Goal: 3 ([id number])  Complications: No apparent anesthesia complications

## 2016-04-25 NOTE — Anesthesia Postprocedure Evaluation (Signed)
Anesthesia Post Note  Patient: Sylvia Petersen  Procedure(s) Performed: Procedure(s) (LRB): TRANSVAGINAL TAPE (TVT) PROCEDURE (N/A) CYSTOSCOPY (N/A) ANTERIOR REPAIR (CYSTOCELE) (N/A)  Patient location during evaluation: [facility] Anesthesia Type: Spinal Level of consciousness: awake, awake and alert, oriented and patient cooperative Pain management: pain level controlled Vital Signs Assessment: post-procedure vital signs reviewed and stable Respiratory status: spontaneous breathing, nonlabored ventilation and respiratory function stable Cardiovascular status: stable Postop Assessment: no signs of nausea or vomiting, patient able to bend at knees, no headache and no backache Anesthetic complications: no        Last Vitals:  Vitals:   04/25/16 1137 04/25/16 1230  BP: (!) 160/71 (!) 153/58  Pulse: 76 69  Resp: 14 16  Temp: 36.9 C 36.4 C    Last Pain:  Vitals:   04/25/16 1230  TempSrc: Oral  PainSc: 2    Pain Goal: Patients Stated Pain Goal: 3 (04/25/16 1130)               Braven Wolk L

## 2016-04-26 ENCOUNTER — Encounter (HOSPITAL_COMMUNITY): Payer: Self-pay | Admitting: Obstetrics and Gynecology

## 2016-04-26 DIAGNOSIS — N8111 Cystocele, midline: Secondary | ICD-10-CM | POA: Diagnosis not present

## 2016-04-26 MED ORDER — TRAMADOL HCL 50 MG PO TABS: 50.0000 mg | ORAL_TABLET | Freq: Two times a day (BID) | ORAL | 0 refills | Status: DC | PRN

## 2016-04-26 NOTE — Progress Notes (Signed)
Sanitary pad saturated with urine, pad and bed pad changed.  350cc clear straw colored urine empty from foley bag.

## 2016-04-26 NOTE — Progress Notes (Signed)
Patient is eating, ambulating, voided once since foley out at 6 am- void 50 cc with PVR 22.  Pt had two episodes of urine leakage onto sanitary pad last night.  At this time pad is once again saturated with urine, no blood at all.  Scant blood on vag pack.  No leaking of urine seen with cough.  Pain control is good.  BP (!) 154/66 (BP Location: Right Arm)   Pulse 82   Temp 98.9 F (37.2 C) (Oral)   Resp 16   Ht 5\' 2"  (1.575 m)   Wt 64.6 kg (142 lb 6 oz)   SpO2 93%   BMI 26.04 kg/m   lungs:   clear to auscultation cor:    RRR Abdomen:  soft, appropriate tenderness, incisions intact and without erythema or exudate. ex:    no cords  Vagina; digital exam shows intact, no bleeding and no urine seen glove or leaking during exam  Lab Results  Component Value Date   WBC 6.5 04/18/2016   HGB 11.0 (L) 04/18/2016   HCT 33.6 (L) 04/18/2016   MCV 87.7 04/18/2016   PLT 265 04/18/2016    A/P  Vaginal pack removed.  Routine care.  Expect d/c per plan but will watch for continued leaking urine and will see if pt passes voiding trial to be able to go home without cath.

## 2016-04-26 NOTE — Progress Notes (Signed)
Pt is discharged in the care of Son with R.N,. Escort. Denies any pain or discomfort.Spirit are good. Understands all instructions well. Questions asked and answered. Voiding without difficulty. Stable. Instructions also given to Son. Denies pain

## 2016-04-26 NOTE — Discharge Summary (Signed)
Physician Discharge Summary  Patient ID: Sylvia Petersen MRN: [id number] DOB/AGE: 1929/09/09 83 y.o.  Admit date: 04/25/2016 Discharge date: 04/26/2016  Admission Diagnoses:  Discharge Diagnoses:  Active Problems:   Postoperative state   Discharged Condition: good  Hospital Course: Uncomplicated TVT, A repair and cystoscopy under spinal.  Pt doing well although has has a few episodes of incontinence.  D/ced without foley since passed voiding trial.   Consults: None  Significant Diagnostic Studies: none  Treatments: surgery: TVT. A repair, cystoscopy  Discharge Exam: Blood pressure (!) 154/66, pulse 82, temperature 98.9 F (37.2 C), temperature source Oral, resp. rate 16, height 5\' 2"  (1.575 m), weight 64.6 kg (142 lb 6 oz), SpO2 93 %.   Disposition: 01-Home or Self Care  Discharge Instructions    Call MD for:  temperature >100.4    Complete by:  As directed    Diet - low sodium heart healthy    Complete by:  As directed    Discharge instructions    Complete by:  As directed    No driving on narcotics, no sexual activity for 2 weeks.   Increase activity slowly    Complete by:  As directed    May shower / Bathe    Complete by:  As directed    Shower, no bath for 2 weeks.   Remove dressing in 24 hours    Complete by:  As directed    Sexual Activity Restrictions    Complete by:  As directed    No sexual activity for 2 weeks.     Allergies as of 04/26/2016   No Known Allergies     Medication List    TAKE these medications   alendronate 70 MG tablet Commonly known as:  FOSAMAX Take 70 mg by mouth every 7 (seven) days. Take with a full glass of water on an empty stomach. Saturday   amLODipine 2.5 MG tablet Commonly known as:  NORVASC Take 2.5 mg by mouth daily.   aspirin 81 MG tablet Take 81 mg by mouth daily. Has stopped prior to procedure   CALCIUM + D3 PO Take 1 tablet by mouth daily.   cephALEXin 500 MG capsule Commonly known as:  KEFLEX Take 1  capsule (500 mg total) by mouth 3 (three) times daily.   ibuprofen 200 MG tablet Commonly known as:  ADVIL,MOTRIN Take 400 mg by mouth every 6 (six) hours as needed for mild pain.   lisinopril 20 MG tablet Commonly known as:  PRINIVIL,ZESTRIL Take 20 mg by mouth daily.   lovastatin 20 MG tablet Commonly known as:  MEVACOR Take 20 mg by mouth daily at 6 PM.   metoprolol 50 MG tablet Commonly known as:  LOPRESSOR Take 50 mg by mouth 2 (two) times daily.   multivitamin with minerals Tabs tablet Take 1 tablet by mouth daily.   traMADol 50 MG tablet Commonly known as:  ULTRAM Take 1 tablet (50 mg total) by mouth every 12 (twelve) hours as needed.   vitamin C 500 MG tablet Commonly known as:  ASCORBIC ACID Take 500 mg by mouth daily.   vitamin E 400 UNIT capsule Generic drug:  vitamin E Take 400 Units by mouth daily.      Follow-up Information    Bryceton Hantz A, MD. Call in 2 week(s).   Specialty:  Obstetrics and Gynecology Contact information: [place] [address] [phone]           Signed: Bobbye Charleston A  04/26/2016, 8:07 AM

## 2016-04-26 NOTE — Progress Notes (Signed)
Foley cath discontinued as ordered at 0610, patient tolerated well.  Vaginal packing remains in place.  Sanitary pad saturated with urine again

## 2016-07-04 DIAGNOSIS — E7800 Pure hypercholesterolemia, unspecified: Secondary | ICD-10-CM | POA: Diagnosis not present

## 2016-07-04 DIAGNOSIS — I10 Essential (primary) hypertension: Secondary | ICD-10-CM | POA: Diagnosis not present

## 2016-07-10 DIAGNOSIS — Z87440 Personal history of urinary (tract) infections: Secondary | ICD-10-CM | POA: Diagnosis not present

## 2016-08-24 DIAGNOSIS — Z1231 Encounter for screening mammogram for malignant neoplasm of breast: Secondary | ICD-10-CM | POA: Diagnosis not present

## 2016-09-21 NOTE — Anesthesia Postprocedure Evaluation (Signed)
Anesthesia Post Note  Patient: Sylvia Petersen  Procedure(s) Performed: Procedure(s) (LRB): TRANSVAGINAL TAPE (TVT) PROCEDURE (N/A) CYSTOSCOPY (N/A) ANTERIOR REPAIR (CYSTOCELE) (N/A)     Anesthesia Post Evaluation  Last Vitals:  Vitals:   04/26/16 0351 04/26/16 0742  BP: (!) 154/66 (!) 162/64  Pulse: 82 80  Resp: 16   Temp: 37.2 C 37.2 C    Last Pain:  Vitals:   04/26/16 1106  TempSrc:   PainSc: 3                  [place] EDWARD

## 2016-09-21 NOTE — Addendum Note (Signed)
Addendum  created 09/21/16 1525 by Lyndle Herrlich, MD   Sign clinical note

## 2016-09-28 DIAGNOSIS — H524 Presbyopia: Secondary | ICD-10-CM | POA: Diagnosis not present

## 2016-10-08 ENCOUNTER — Encounter: Payer: Self-pay | Admitting: Podiatry

## 2016-10-08 ENCOUNTER — Ambulatory Visit (INDEPENDENT_AMBULATORY_CARE_PROVIDER_SITE_OTHER): Payer: PPO | Admitting: Podiatry

## 2016-10-08 VITALS — Temp 98.6°F

## 2016-10-08 DIAGNOSIS — L600 Ingrowing nail: Secondary | ICD-10-CM

## 2016-10-08 NOTE — Progress Notes (Signed)
   Subjective:    Patient ID: Sylvia Petersen, female    DOB: September 08, 1929, 83 y.o.   MRN: [id number]  HPI  Chief Complaint  Patient presents with  . Nail Problem    Rt foot great toenail is thick Jeoffrey Massed for about 6 months   Review of Systems  Eyes: Positive for redness.  Gastrointestinal: Positive for constipation.  Neurological: Positive for dizziness.       Objective:   Physical Exam        Assessment & Plan:

## 2016-10-08 NOTE — Patient Instructions (Addendum)

## 2016-10-09 NOTE — Progress Notes (Signed)
Subjective:    Patient ID: Sylvia Petersen, female   DOB: 83 y.o.   MRN: [id number]   HPI patient presents stating that her right big toenails really bothering her and she like to have it removed    ROS      Objective:  Physical Exam neurovascular status intact negative Homans sign was noted with patient's right first nail showing thickness and moderate movement upon palpation. It is sore when pressed and it's making it difficult for her to be ambulatory due to the pain     Assessment:     Chronic nail disease with pain of the right hallux nail that is thickened and dystrophic    Plan:   H&P condition reviewed and recommended nail removal. Explained procedure and risk and today I went ahead and I infiltrated the right hallux 60 Milligan's I can Marcaine mixture remove the hallux nail exposed matrix and applied phenol for applications 30 seconds followed by alcohol lavaged sterile dressing. Gave instructions on soaks and reappoint and is encouraged to call with any questions

## 2017-01-08 DIAGNOSIS — I10 Essential (primary) hypertension: Secondary | ICD-10-CM | POA: Diagnosis not present

## 2017-01-08 DIAGNOSIS — M8580 Other specified disorders of bone density and structure, unspecified site: Secondary | ICD-10-CM | POA: Diagnosis not present

## 2017-01-08 DIAGNOSIS — E7800 Pure hypercholesterolemia, unspecified: Secondary | ICD-10-CM | POA: Diagnosis not present

## 2017-01-08 DIAGNOSIS — Z1389 Encounter for screening for other disorder: Secondary | ICD-10-CM | POA: Diagnosis not present

## 2017-01-08 DIAGNOSIS — Z0000 Encounter for general adult medical examination without abnormal findings: Secondary | ICD-10-CM | POA: Diagnosis not present

## 2017-03-01 DIAGNOSIS — C4339 Malignant melanoma of other parts of face: Secondary | ICD-10-CM | POA: Diagnosis not present

## 2017-03-01 DIAGNOSIS — L570 Actinic keratosis: Secondary | ICD-10-CM | POA: Diagnosis not present

## 2017-03-01 DIAGNOSIS — L821 Other seborrheic keratosis: Secondary | ICD-10-CM | POA: Diagnosis not present

## 2017-03-18 DIAGNOSIS — C4339 Malignant melanoma of other parts of face: Secondary | ICD-10-CM | POA: Diagnosis not present

## 2017-03-18 DIAGNOSIS — D0339 Melanoma in situ of other parts of face: Secondary | ICD-10-CM | POA: Diagnosis not present

## 2017-03-18 DIAGNOSIS — Z85820 Personal history of malignant melanoma of skin: Secondary | ICD-10-CM | POA: Diagnosis not present

## 2017-03-19 DIAGNOSIS — D0339 Melanoma in situ of other parts of face: Secondary | ICD-10-CM | POA: Diagnosis not present

## 2017-03-20 DIAGNOSIS — L988 Other specified disorders of the skin and subcutaneous tissue: Secondary | ICD-10-CM | POA: Diagnosis not present

## 2017-03-20 DIAGNOSIS — C4339 Malignant melanoma of other parts of face: Secondary | ICD-10-CM | POA: Diagnosis not present

## 2017-05-02 DIAGNOSIS — L570 Actinic keratosis: Secondary | ICD-10-CM | POA: Diagnosis not present

## 2017-05-02 DIAGNOSIS — L578 Other skin changes due to chronic exposure to nonionizing radiation: Secondary | ICD-10-CM | POA: Diagnosis not present

## 2017-05-02 DIAGNOSIS — Z85820 Personal history of malignant melanoma of skin: Secondary | ICD-10-CM | POA: Diagnosis not present

## 2017-07-09 DIAGNOSIS — E7800 Pure hypercholesterolemia, unspecified: Secondary | ICD-10-CM | POA: Diagnosis not present

## 2017-07-09 DIAGNOSIS — I10 Essential (primary) hypertension: Secondary | ICD-10-CM | POA: Diagnosis not present

## 2017-07-31 DIAGNOSIS — L821 Other seborrheic keratosis: Secondary | ICD-10-CM | POA: Diagnosis not present

## 2017-07-31 DIAGNOSIS — L570 Actinic keratosis: Secondary | ICD-10-CM | POA: Diagnosis not present

## 2017-07-31 DIAGNOSIS — Z85820 Personal history of malignant melanoma of skin: Secondary | ICD-10-CM | POA: Diagnosis not present

## 2017-07-31 DIAGNOSIS — D1801 Hemangioma of skin and subcutaneous tissue: Secondary | ICD-10-CM | POA: Diagnosis not present

## 2017-07-31 DIAGNOSIS — C44719 Basal cell carcinoma of skin of left lower limb, including hip: Secondary | ICD-10-CM | POA: Diagnosis not present

## 2017-08-27 DIAGNOSIS — M8589 Other specified disorders of bone density and structure, multiple sites: Secondary | ICD-10-CM | POA: Diagnosis not present

## 2017-08-27 DIAGNOSIS — Z1231 Encounter for screening mammogram for malignant neoplasm of breast: Secondary | ICD-10-CM | POA: Diagnosis not present

## 2017-10-02 DIAGNOSIS — H353131 Nonexudative age-related macular degeneration, bilateral, early dry stage: Secondary | ICD-10-CM | POA: Diagnosis not present

## 2017-10-02 DIAGNOSIS — H04129 Dry eye syndrome of unspecified lacrimal gland: Secondary | ICD-10-CM | POA: Diagnosis not present

## 2017-10-02 DIAGNOSIS — H5213 Myopia, bilateral: Secondary | ICD-10-CM | POA: Diagnosis not present

## 2017-10-02 DIAGNOSIS — H47091 Other disorders of optic nerve, not elsewhere classified, right eye: Secondary | ICD-10-CM | POA: Diagnosis not present

## 2017-10-02 DIAGNOSIS — H47099 Other disorders of optic nerve, not elsewhere classified, unspecified eye: Secondary | ICD-10-CM | POA: Diagnosis not present

## 2017-10-02 DIAGNOSIS — H524 Presbyopia: Secondary | ICD-10-CM | POA: Diagnosis not present

## 2017-10-02 DIAGNOSIS — H52223 Regular astigmatism, bilateral: Secondary | ICD-10-CM | POA: Diagnosis not present

## 2017-10-02 DIAGNOSIS — Z961 Presence of intraocular lens: Secondary | ICD-10-CM | POA: Diagnosis not present

## 2017-10-15 ENCOUNTER — Encounter (INDEPENDENT_AMBULATORY_CARE_PROVIDER_SITE_OTHER): Payer: PPO | Admitting: Ophthalmology

## 2017-10-15 DIAGNOSIS — H353122 Nonexudative age-related macular degeneration, left eye, intermediate dry stage: Secondary | ICD-10-CM | POA: Diagnosis not present

## 2017-10-15 DIAGNOSIS — D3132 Benign neoplasm of left choroid: Secondary | ICD-10-CM | POA: Diagnosis not present

## 2017-10-15 DIAGNOSIS — H35033 Hypertensive retinopathy, bilateral: Secondary | ICD-10-CM | POA: Diagnosis not present

## 2017-10-15 DIAGNOSIS — H318 Other specified disorders of choroid: Secondary | ICD-10-CM

## 2017-10-15 DIAGNOSIS — H4421 Degenerative myopia, right eye: Secondary | ICD-10-CM

## 2017-10-15 DIAGNOSIS — H26492 Other secondary cataract, left eye: Secondary | ICD-10-CM

## 2017-10-15 DIAGNOSIS — I10 Essential (primary) hypertension: Secondary | ICD-10-CM | POA: Diagnosis not present

## 2017-10-15 DIAGNOSIS — H43813 Vitreous degeneration, bilateral: Secondary | ICD-10-CM

## 2017-10-17 ENCOUNTER — Encounter (INDEPENDENT_AMBULATORY_CARE_PROVIDER_SITE_OTHER): Payer: PPO | Admitting: Ophthalmology

## 2017-10-17 DIAGNOSIS — H2702 Aphakia, left eye: Secondary | ICD-10-CM

## 2017-10-17 DIAGNOSIS — H442A1 Degenerative myopia with choroidal neovascularization, right eye: Secondary | ICD-10-CM

## 2017-10-31 DIAGNOSIS — L570 Actinic keratosis: Secondary | ICD-10-CM | POA: Diagnosis not present

## 2017-10-31 DIAGNOSIS — Z85828 Personal history of other malignant neoplasm of skin: Secondary | ICD-10-CM | POA: Diagnosis not present

## 2017-10-31 DIAGNOSIS — Z85820 Personal history of malignant melanoma of skin: Secondary | ICD-10-CM | POA: Diagnosis not present

## 2017-10-31 DIAGNOSIS — L820 Inflamed seborrheic keratosis: Secondary | ICD-10-CM | POA: Diagnosis not present

## 2017-10-31 DIAGNOSIS — L814 Other melanin hyperpigmentation: Secondary | ICD-10-CM | POA: Diagnosis not present

## 2017-10-31 DIAGNOSIS — D225 Melanocytic nevi of trunk: Secondary | ICD-10-CM | POA: Diagnosis not present

## 2017-11-14 ENCOUNTER — Encounter (INDEPENDENT_AMBULATORY_CARE_PROVIDER_SITE_OTHER): Payer: PPO | Admitting: Ophthalmology

## 2017-11-14 DIAGNOSIS — H43813 Vitreous degeneration, bilateral: Secondary | ICD-10-CM

## 2017-11-14 DIAGNOSIS — H442A1 Degenerative myopia with choroidal neovascularization, right eye: Secondary | ICD-10-CM | POA: Diagnosis not present

## 2017-11-14 DIAGNOSIS — H35033 Hypertensive retinopathy, bilateral: Secondary | ICD-10-CM | POA: Diagnosis not present

## 2017-11-14 DIAGNOSIS — H353122 Nonexudative age-related macular degeneration, left eye, intermediate dry stage: Secondary | ICD-10-CM | POA: Diagnosis not present

## 2017-11-14 DIAGNOSIS — D3132 Benign neoplasm of left choroid: Secondary | ICD-10-CM | POA: Diagnosis not present

## 2017-11-14 DIAGNOSIS — I10 Essential (primary) hypertension: Secondary | ICD-10-CM

## 2017-12-12 ENCOUNTER — Encounter (INDEPENDENT_AMBULATORY_CARE_PROVIDER_SITE_OTHER): Payer: PPO | Admitting: Ophthalmology

## 2017-12-12 DIAGNOSIS — H318 Other specified disorders of choroid: Secondary | ICD-10-CM

## 2017-12-12 DIAGNOSIS — H43813 Vitreous degeneration, bilateral: Secondary | ICD-10-CM

## 2017-12-12 DIAGNOSIS — H35033 Hypertensive retinopathy, bilateral: Secondary | ICD-10-CM | POA: Diagnosis not present

## 2017-12-12 DIAGNOSIS — H4421 Degenerative myopia, right eye: Secondary | ICD-10-CM

## 2017-12-12 DIAGNOSIS — I10 Essential (primary) hypertension: Secondary | ICD-10-CM

## 2017-12-12 DIAGNOSIS — D3132 Benign neoplasm of left choroid: Secondary | ICD-10-CM

## 2017-12-12 DIAGNOSIS — H353122 Nonexudative age-related macular degeneration, left eye, intermediate dry stage: Secondary | ICD-10-CM

## 2018-01-09 ENCOUNTER — Encounter (INDEPENDENT_AMBULATORY_CARE_PROVIDER_SITE_OTHER): Payer: PPO | Admitting: Ophthalmology

## 2018-01-09 DIAGNOSIS — I10 Essential (primary) hypertension: Secondary | ICD-10-CM | POA: Diagnosis not present

## 2018-01-09 DIAGNOSIS — H43813 Vitreous degeneration, bilateral: Secondary | ICD-10-CM

## 2018-01-09 DIAGNOSIS — H353122 Nonexudative age-related macular degeneration, left eye, intermediate dry stage: Secondary | ICD-10-CM | POA: Diagnosis not present

## 2018-01-09 DIAGNOSIS — H353211 Exudative age-related macular degeneration, right eye, with active choroidal neovascularization: Secondary | ICD-10-CM

## 2018-01-09 DIAGNOSIS — D3132 Benign neoplasm of left choroid: Secondary | ICD-10-CM

## 2018-01-09 DIAGNOSIS — H35033 Hypertensive retinopathy, bilateral: Secondary | ICD-10-CM

## 2018-01-16 DIAGNOSIS — I10 Essential (primary) hypertension: Secondary | ICD-10-CM | POA: Diagnosis not present

## 2018-01-16 DIAGNOSIS — Z1389 Encounter for screening for other disorder: Secondary | ICD-10-CM | POA: Diagnosis not present

## 2018-01-16 DIAGNOSIS — Z0000 Encounter for general adult medical examination without abnormal findings: Secondary | ICD-10-CM | POA: Diagnosis not present

## 2018-01-16 DIAGNOSIS — E7800 Pure hypercholesterolemia, unspecified: Secondary | ICD-10-CM | POA: Diagnosis not present

## 2018-01-31 DIAGNOSIS — D1801 Hemangioma of skin and subcutaneous tissue: Secondary | ICD-10-CM | POA: Diagnosis not present

## 2018-01-31 DIAGNOSIS — L821 Other seborrheic keratosis: Secondary | ICD-10-CM | POA: Diagnosis not present

## 2018-01-31 DIAGNOSIS — L570 Actinic keratosis: Secondary | ICD-10-CM | POA: Diagnosis not present

## 2018-01-31 DIAGNOSIS — Z85820 Personal history of malignant melanoma of skin: Secondary | ICD-10-CM | POA: Diagnosis not present

## 2018-02-04 ENCOUNTER — Encounter (INDEPENDENT_AMBULATORY_CARE_PROVIDER_SITE_OTHER): Payer: PPO | Admitting: Ophthalmology

## 2018-02-04 DIAGNOSIS — H43813 Vitreous degeneration, bilateral: Secondary | ICD-10-CM

## 2018-02-04 DIAGNOSIS — D3132 Benign neoplasm of left choroid: Secondary | ICD-10-CM

## 2018-02-04 DIAGNOSIS — I10 Essential (primary) hypertension: Secondary | ICD-10-CM

## 2018-02-04 DIAGNOSIS — H353211 Exudative age-related macular degeneration, right eye, with active choroidal neovascularization: Secondary | ICD-10-CM | POA: Diagnosis not present

## 2018-02-04 DIAGNOSIS — H4423 Degenerative myopia, bilateral: Secondary | ICD-10-CM | POA: Diagnosis not present

## 2018-02-04 DIAGNOSIS — H353122 Nonexudative age-related macular degeneration, left eye, intermediate dry stage: Secondary | ICD-10-CM

## 2018-02-04 DIAGNOSIS — H35033 Hypertensive retinopathy, bilateral: Secondary | ICD-10-CM

## 2018-02-18 ENCOUNTER — Emergency Department (HOSPITAL_COMMUNITY)
Admission: EM | Admit: 2018-02-18 | Discharge: 2018-02-18 | Disposition: A | Discharge status: Home / Self Care | Payer: PPO | Attending: Emergency Medicine | Admitting: Emergency Medicine

## 2018-02-18 ENCOUNTER — Emergency Department (HOSPITAL_COMMUNITY): Payer: PPO

## 2018-02-18 ENCOUNTER — Encounter (HOSPITAL_COMMUNITY): Payer: Self-pay | Admitting: *Deleted

## 2018-02-18 DIAGNOSIS — R0781 Pleurodynia: Secondary | ICD-10-CM | POA: Diagnosis not present

## 2018-02-18 DIAGNOSIS — I10 Essential (primary) hypertension: Secondary | ICD-10-CM | POA: Insufficient documentation

## 2018-02-18 DIAGNOSIS — Z7982 Long term (current) use of aspirin: Secondary | ICD-10-CM | POA: Insufficient documentation

## 2018-02-18 DIAGNOSIS — W01198A Fall on same level from slipping, tripping and stumbling with subsequent striking against other object, initial encounter: Secondary | ICD-10-CM | POA: Diagnosis not present

## 2018-02-18 DIAGNOSIS — S20211A Contusion of right front wall of thorax, initial encounter: Secondary | ICD-10-CM | POA: Diagnosis not present

## 2018-02-18 DIAGNOSIS — Z79899 Other long term (current) drug therapy: Secondary | ICD-10-CM | POA: Diagnosis not present

## 2018-02-18 DIAGNOSIS — Y999 Unspecified external cause status: Secondary | ICD-10-CM | POA: Diagnosis not present

## 2018-02-18 DIAGNOSIS — S299XXA Unspecified injury of thorax, initial encounter: Secondary | ICD-10-CM | POA: Diagnosis not present

## 2018-02-18 DIAGNOSIS — Y93E8 Activity, other personal hygiene: Secondary | ICD-10-CM | POA: Diagnosis not present

## 2018-02-18 DIAGNOSIS — R0789 Other chest pain: Secondary | ICD-10-CM | POA: Diagnosis not present

## 2018-02-18 DIAGNOSIS — Y92002 Bathroom of unspecified non-institutional (private) residence single-family (private) house as the place of occurrence of the external cause: Secondary | ICD-10-CM | POA: Insufficient documentation

## 2018-02-18 MED ORDER — TRAMADOL HCL 50 MG PO TABS: 50.0000 mg | ORAL_TABLET | Freq: Two times a day (BID) | ORAL | 0 refills | Status: DC | PRN

## 2018-02-18 NOTE — ED Provider Notes (Signed)
[facility] DEPT Provider Note   CSN: [id number] Arrival date & time: 02/18/18  0810     History   Chief Complaint Chief Complaint  Patient presents with  . Fall  . right rib cage pain    HPI Sylvia Petersen is a 84 y.o. female.  Pt presents to the ED today with right sided chest wall pain.  The pt slipped and fell around 0400 while using the restroom.  She denies hitting her head or having a loc.  She is not on blood thinners.  She denies n/v.  It hurts with movement and with deep breaths.  No abdominal pain.     Past Medical History:  Diagnosis Date  . Arthritis   . High cholesterol   . Hypertension     Patient Active Problem List   Diagnosis Date Noted  . Postoperative state 04/25/2016    Past Surgical History:  Procedure Laterality Date  . ABDOMINAL HYSTERECTOMY    . BLADDER SUSPENSION N/A 04/25/2016   Procedure: TRANSVAGINAL TAPE (TVT) PROCEDURE;  Surgeon: Bobbye Charleston, MD;  Location: [facility];  Service: Gynecology;  Laterality: N/A;  . CARDIAC CATHETERIZATION    . CYSTOCELE REPAIR N/A 04/25/2016   Procedure: ANTERIOR REPAIR (CYSTOCELE);  Surgeon: Bobbye Charleston, MD;  Location: [facility];  Service: Gynecology;  Laterality: N/A;  . CYSTOSCOPY N/A 04/25/2016   Procedure: CYSTOSCOPY;  Surgeon: Bobbye Charleston, MD;  Location: [facility];  Service: Gynecology;  Laterality: N/A;  . EYE SURGERY       OB History   None      Home Medications    Prior to Admission medications   Medication Sig Start Date End Date Taking? Authorizing Provider  amLODipine (NORVASC) 2.5 MG tablet Take 2.5 mg by mouth daily.   Yes [provider]  aspirin 81 MG tablet Take 81 mg by mouth daily.    Yes [provider]  Calcium Carb-Cholecalciferol (CALCIUM + D3 PO) Take 1 tablet by mouth daily.   Yes [provider]  ibuprofen (ADVIL,MOTRIN) 200 MG tablet Take 400 mg by mouth every 6 (six) hours as needed for mild pain.   Yes  [provider]  lisinopril (PRINIVIL,ZESTRIL) 20 MG tablet Take 20 mg by mouth at bedtime.    Yes [provider]  lovastatin (MEVACOR) 20 MG tablet Take 20 mg by mouth daily at 6 PM.    Yes [provider]  metoprolol (LOPRESSOR) 50 MG tablet Take 50 mg by mouth 2 (two) times daily.   Yes [provider]  Multiple Vitamin (MULTIVITAMIN WITH MINERALS) TABS Take 1 tablet by mouth daily.   Yes [provider]  vitamin C (ASCORBIC ACID) 500 MG tablet Take 500 mg by mouth daily.   Yes [provider]  vitamin E (VITAMIN E) 400 UNIT capsule Take 400 Units by mouth daily.   Yes [provider]  cephALEXin (KEFLEX) 500 MG capsule Take 1 capsule (500 mg total) by mouth 3 (three) times daily. Patient not taking: Reported on 02/18/2018 12/02/13   Harriet Masson, DPM  traMADol (ULTRAM) 50 MG tablet Take 1 tablet (50 mg total) by mouth every 12 (twelve) hours as needed. 02/18/18   Isla Pence, MD    Family History Family History  Problem Relation Age of Onset  . Cancer Sister     Social History Social History   Tobacco Use  . Smoking status: Never Smoker  . Smokeless tobacco: Never Used  Substance Use Topics  .  Alcohol use: No  . Drug use: No     Allergies   Patient has no known allergies.   Review of Systems Review of Systems  Musculoskeletal:       Right chest wall pain  All other systems reviewed and are negative.    Physical Exam Updated Vital Signs BP (!) 155/77 (BP Location: Left Arm)   Pulse 85   Temp 97.9 F (36.6 C) (Oral)   Resp 18   SpO2 100%   Physical Exam  Constitutional: She is oriented to person, place, and time. She appears well-developed and well-nourished.  HENT:  Head: Normocephalic and atraumatic.  Right Ear: External ear normal.  Left Ear: External ear normal.  Nose: Nose normal.  Mouth/Throat: Oropharynx is clear and moist.  Eyes: Pupils are equal, round, and reactive to light.  Conjunctivae and EOM are normal.  Neck: Normal range of motion. Neck supple.  Cardiovascular: Normal rate, regular rhythm, normal heart sounds and intact distal pulses.  Pulmonary/Chest: Effort normal and breath sounds normal.    Abdominal: Soft. Bowel sounds are normal.  Neurological: She is alert and oriented to person, place, and time.  Skin: Skin is warm and dry. Capillary refill takes less than 2 seconds.  Psychiatric: She has a normal mood and affect. Her behavior is normal. Judgment and thought content normal.  Nursing note and vitals reviewed.    ED Treatments / Results  Labs (all labs ordered are listed, but only abnormal results are displayed) Labs Reviewed - No data to display  EKG None  Radiology Ct Chest Wo Contrast  Result Date: 02/18/2018 CLINICAL DATA:  [place] this morning at 0400 hours, lost balance and fell while going to the bathroom, fell between commode and tub, RIGHT-side rib pain, suspected fractures EXAM: CT CHEST WITHOUT CONTRAST TECHNIQUE: Multidetector CT imaging of the chest was performed following the standard protocol without IV contrast. Sagittal and coronal MPR images reconstructed from axial data set. COMPARISON:  None FINDINGS: Cardiovascular: Minimal pericardial effusion. Scattered atherosclerotic calcifications aorta, proximal great vessels and coronary arteries. Aorta normal caliber. Mediastinum/Nodes: Small hiatal hernia. Esophagus unremarkable. Base of cervical region normal appearance. No thoracic adenopathy. Lungs/Pleura: Minimal biapical scarring. Calcified granulomata RIGHT upper lobe. No acute infiltrate, pleural effusion, or pneumothorax. Upper Abdomen: Visualized upper abdomen unremarkable. Bones demineralized. Musculoskeletal: Small bone island at tip of RIGHT scapula. No fractures or bone destruction. IMPRESSION: Old granulomatous disease. Small hiatal hernia. No acute intrathoracic injuries. Minimal pericardial effusion. Scattered  atherosclerotic disease changes including coronary arteries. Aortic Atherosclerosis (ICD10-I70.0). Electronically Signed   By: Lavonia Dana M.D.   On: 02/18/2018 09:14    Procedures Procedures (including critical care time)  Medications Ordered in ED Medications - No data to display   Initial Impression / Assessment and Plan / ED Course  I have reviewed the triage vital signs and the nursing notes.  Pertinent labs & imaging results that were available during my care of the patient were reviewed by me and considered in my medical decision making (see chart for details).    Thankfully, pt does not have a rib fractures.  She will be given an incentive spirometer prior to d/c.  She did not want anything for pain.  She will be d/c home with tramadol in case she changes her mind.  She knows to return if worse.  Final Clinical Impressions(s) / ED Diagnoses   Final diagnoses:  Contusion of rib on right side, initial encounter    ED Discharge Orders  Ordered    traMADol (ULTRAM) 50 MG tablet  Every 12 hours PRN     02/18/18 9326           Isla Pence, MD 02/18/18 [phone]

## 2018-02-18 NOTE — ED Triage Notes (Signed)
Pt complains of right sided rib pain since falling at 4AM this morning. Pt lost balance and fell while going to the bathroom, falling between commode and tub. Pt is not on blood thinners, denies head or neck pain/injury, denies loss of consciousness.

## 2018-02-20 DIAGNOSIS — S20211A Contusion of right front wall of thorax, initial encounter: Secondary | ICD-10-CM | POA: Diagnosis not present

## 2018-02-20 DIAGNOSIS — Z9181 History of falling: Secondary | ICD-10-CM | POA: Diagnosis not present

## 2018-02-21 DIAGNOSIS — R710 Precipitous drop in hematocrit: Secondary | ICD-10-CM | POA: Diagnosis not present

## 2018-03-03 ENCOUNTER — Encounter (INDEPENDENT_AMBULATORY_CARE_PROVIDER_SITE_OTHER): Payer: PPO | Admitting: Ophthalmology

## 2018-03-03 DIAGNOSIS — D3132 Benign neoplasm of left choroid: Secondary | ICD-10-CM | POA: Diagnosis not present

## 2018-03-03 DIAGNOSIS — H353211 Exudative age-related macular degeneration, right eye, with active choroidal neovascularization: Secondary | ICD-10-CM

## 2018-03-03 DIAGNOSIS — H4423 Degenerative myopia, bilateral: Secondary | ICD-10-CM | POA: Diagnosis not present

## 2018-03-03 DIAGNOSIS — H43813 Vitreous degeneration, bilateral: Secondary | ICD-10-CM | POA: Diagnosis not present

## 2018-03-03 DIAGNOSIS — I10 Essential (primary) hypertension: Secondary | ICD-10-CM | POA: Diagnosis not present

## 2018-03-03 DIAGNOSIS — H35033 Hypertensive retinopathy, bilateral: Secondary | ICD-10-CM | POA: Diagnosis not present

## 2018-03-03 DIAGNOSIS — H353122 Nonexudative age-related macular degeneration, left eye, intermediate dry stage: Secondary | ICD-10-CM | POA: Diagnosis not present

## 2018-03-26 DIAGNOSIS — R710 Precipitous drop in hematocrit: Secondary | ICD-10-CM | POA: Diagnosis not present

## 2018-04-07 ENCOUNTER — Encounter (INDEPENDENT_AMBULATORY_CARE_PROVIDER_SITE_OTHER): Payer: PPO | Admitting: Ophthalmology

## 2018-04-07 DIAGNOSIS — I10 Essential (primary) hypertension: Secondary | ICD-10-CM | POA: Diagnosis not present

## 2018-04-07 DIAGNOSIS — H35033 Hypertensive retinopathy, bilateral: Secondary | ICD-10-CM | POA: Diagnosis not present

## 2018-04-07 DIAGNOSIS — H353211 Exudative age-related macular degeneration, right eye, with active choroidal neovascularization: Secondary | ICD-10-CM

## 2018-04-07 DIAGNOSIS — H43813 Vitreous degeneration, bilateral: Secondary | ICD-10-CM

## 2018-04-07 DIAGNOSIS — H353112 Nonexudative age-related macular degeneration, right eye, intermediate dry stage: Secondary | ICD-10-CM

## 2018-04-07 DIAGNOSIS — D3132 Benign neoplasm of left choroid: Secondary | ICD-10-CM | POA: Diagnosis not present

## 2018-05-12 ENCOUNTER — Encounter (INDEPENDENT_AMBULATORY_CARE_PROVIDER_SITE_OTHER): Payer: PPO | Admitting: Ophthalmology

## 2018-05-12 DIAGNOSIS — H43813 Vitreous degeneration, bilateral: Secondary | ICD-10-CM | POA: Diagnosis not present

## 2018-05-12 DIAGNOSIS — I10 Essential (primary) hypertension: Secondary | ICD-10-CM | POA: Diagnosis not present

## 2018-05-12 DIAGNOSIS — H353122 Nonexudative age-related macular degeneration, left eye, intermediate dry stage: Secondary | ICD-10-CM

## 2018-05-12 DIAGNOSIS — H35033 Hypertensive retinopathy, bilateral: Secondary | ICD-10-CM | POA: Diagnosis not present

## 2018-05-12 DIAGNOSIS — H353211 Exudative age-related macular degeneration, right eye, with active choroidal neovascularization: Secondary | ICD-10-CM | POA: Diagnosis not present

## 2018-05-12 DIAGNOSIS — H318 Other specified disorders of choroid: Secondary | ICD-10-CM

## 2018-05-12 DIAGNOSIS — D3132 Benign neoplasm of left choroid: Secondary | ICD-10-CM | POA: Diagnosis not present

## 2018-06-30 ENCOUNTER — Encounter (INDEPENDENT_AMBULATORY_CARE_PROVIDER_SITE_OTHER): Payer: PPO | Admitting: Ophthalmology

## 2018-07-17 DIAGNOSIS — E7800 Pure hypercholesterolemia, unspecified: Secondary | ICD-10-CM | POA: Diagnosis not present

## 2018-07-17 DIAGNOSIS — R634 Abnormal weight loss: Secondary | ICD-10-CM | POA: Diagnosis not present

## 2018-07-17 DIAGNOSIS — R710 Precipitous drop in hematocrit: Secondary | ICD-10-CM | POA: Diagnosis not present

## 2018-07-17 DIAGNOSIS — N3281 Overactive bladder: Secondary | ICD-10-CM | POA: Diagnosis not present

## 2018-07-17 DIAGNOSIS — I10 Essential (primary) hypertension: Secondary | ICD-10-CM | POA: Diagnosis not present

## 2018-08-19 DIAGNOSIS — R710 Precipitous drop in hematocrit: Secondary | ICD-10-CM | POA: Diagnosis not present

## 2018-08-19 DIAGNOSIS — I10 Essential (primary) hypertension: Secondary | ICD-10-CM | POA: Diagnosis not present

## 2018-08-28 ENCOUNTER — Encounter (INDEPENDENT_AMBULATORY_CARE_PROVIDER_SITE_OTHER): Payer: PPO | Admitting: Ophthalmology

## 2018-08-28 ENCOUNTER — Other Ambulatory Visit: Payer: Self-pay

## 2018-08-28 DIAGNOSIS — H35033 Hypertensive retinopathy, bilateral: Secondary | ICD-10-CM

## 2018-08-28 DIAGNOSIS — H353211 Exudative age-related macular degeneration, right eye, with active choroidal neovascularization: Secondary | ICD-10-CM | POA: Diagnosis not present

## 2018-08-28 DIAGNOSIS — I10 Essential (primary) hypertension: Secondary | ICD-10-CM | POA: Diagnosis not present

## 2018-08-28 DIAGNOSIS — H43813 Vitreous degeneration, bilateral: Secondary | ICD-10-CM

## 2018-08-28 DIAGNOSIS — H353122 Nonexudative age-related macular degeneration, left eye, intermediate dry stage: Secondary | ICD-10-CM | POA: Diagnosis not present

## 2018-08-28 DIAGNOSIS — D3132 Benign neoplasm of left choroid: Secondary | ICD-10-CM

## 2018-09-25 ENCOUNTER — Other Ambulatory Visit: Payer: Self-pay

## 2018-09-25 ENCOUNTER — Encounter (INDEPENDENT_AMBULATORY_CARE_PROVIDER_SITE_OTHER): Payer: PPO | Admitting: Ophthalmology

## 2018-09-25 DIAGNOSIS — H353211 Exudative age-related macular degeneration, right eye, with active choroidal neovascularization: Secondary | ICD-10-CM | POA: Diagnosis not present

## 2018-09-25 DIAGNOSIS — H35033 Hypertensive retinopathy, bilateral: Secondary | ICD-10-CM

## 2018-09-25 DIAGNOSIS — D3132 Benign neoplasm of left choroid: Secondary | ICD-10-CM

## 2018-09-25 DIAGNOSIS — H353122 Nonexudative age-related macular degeneration, left eye, intermediate dry stage: Secondary | ICD-10-CM

## 2018-09-25 DIAGNOSIS — H43813 Vitreous degeneration, bilateral: Secondary | ICD-10-CM

## 2018-09-25 DIAGNOSIS — I10 Essential (primary) hypertension: Secondary | ICD-10-CM

## 2018-10-20 DIAGNOSIS — H353131 Nonexudative age-related macular degeneration, bilateral, early dry stage: Secondary | ICD-10-CM | POA: Diagnosis not present

## 2018-10-20 DIAGNOSIS — H524 Presbyopia: Secondary | ICD-10-CM | POA: Diagnosis not present

## 2018-10-20 DIAGNOSIS — H47091 Other disorders of optic nerve, not elsewhere classified, right eye: Secondary | ICD-10-CM | POA: Diagnosis not present

## 2018-10-20 DIAGNOSIS — H52223 Regular astigmatism, bilateral: Secondary | ICD-10-CM | POA: Diagnosis not present

## 2018-10-20 DIAGNOSIS — Z961 Presence of intraocular lens: Secondary | ICD-10-CM | POA: Diagnosis not present

## 2018-10-20 DIAGNOSIS — H04129 Dry eye syndrome of unspecified lacrimal gland: Secondary | ICD-10-CM | POA: Diagnosis not present

## 2018-10-20 DIAGNOSIS — H47099 Other disorders of optic nerve, not elsewhere classified, unspecified eye: Secondary | ICD-10-CM | POA: Diagnosis not present

## 2018-10-20 DIAGNOSIS — H5213 Myopia, bilateral: Secondary | ICD-10-CM | POA: Diagnosis not present

## 2018-10-23 ENCOUNTER — Other Ambulatory Visit: Payer: Self-pay

## 2018-10-23 ENCOUNTER — Encounter (INDEPENDENT_AMBULATORY_CARE_PROVIDER_SITE_OTHER): Payer: PPO | Admitting: Ophthalmology

## 2018-10-23 DIAGNOSIS — H353122 Nonexudative age-related macular degeneration, left eye, intermediate dry stage: Secondary | ICD-10-CM | POA: Diagnosis not present

## 2018-10-23 DIAGNOSIS — D3132 Benign neoplasm of left choroid: Secondary | ICD-10-CM

## 2018-10-23 DIAGNOSIS — H353211 Exudative age-related macular degeneration, right eye, with active choroidal neovascularization: Secondary | ICD-10-CM | POA: Diagnosis not present

## 2018-10-23 DIAGNOSIS — H35033 Hypertensive retinopathy, bilateral: Secondary | ICD-10-CM

## 2018-10-23 DIAGNOSIS — H43813 Vitreous degeneration, bilateral: Secondary | ICD-10-CM

## 2018-10-23 DIAGNOSIS — I10 Essential (primary) hypertension: Secondary | ICD-10-CM

## 2018-11-27 ENCOUNTER — Encounter (INDEPENDENT_AMBULATORY_CARE_PROVIDER_SITE_OTHER): Payer: PPO | Admitting: Ophthalmology

## 2018-11-27 ENCOUNTER — Other Ambulatory Visit: Payer: Self-pay

## 2018-11-27 DIAGNOSIS — H353122 Nonexudative age-related macular degeneration, left eye, intermediate dry stage: Secondary | ICD-10-CM | POA: Diagnosis not present

## 2018-11-27 DIAGNOSIS — H43813 Vitreous degeneration, bilateral: Secondary | ICD-10-CM | POA: Diagnosis not present

## 2018-11-27 DIAGNOSIS — D3132 Benign neoplasm of left choroid: Secondary | ICD-10-CM | POA: Diagnosis not present

## 2018-11-27 DIAGNOSIS — H35033 Hypertensive retinopathy, bilateral: Secondary | ICD-10-CM

## 2018-11-27 DIAGNOSIS — H353211 Exudative age-related macular degeneration, right eye, with active choroidal neovascularization: Secondary | ICD-10-CM | POA: Diagnosis not present

## 2018-11-27 DIAGNOSIS — I10 Essential (primary) hypertension: Secondary | ICD-10-CM

## 2018-12-25 ENCOUNTER — Encounter (INDEPENDENT_AMBULATORY_CARE_PROVIDER_SITE_OTHER): Payer: PPO | Admitting: Ophthalmology

## 2018-12-25 DIAGNOSIS — H43813 Vitreous degeneration, bilateral: Secondary | ICD-10-CM

## 2018-12-25 DIAGNOSIS — H35033 Hypertensive retinopathy, bilateral: Secondary | ICD-10-CM | POA: Diagnosis not present

## 2018-12-25 DIAGNOSIS — H353231 Exudative age-related macular degeneration, bilateral, with active choroidal neovascularization: Secondary | ICD-10-CM

## 2018-12-25 DIAGNOSIS — I10 Essential (primary) hypertension: Secondary | ICD-10-CM

## 2018-12-25 DIAGNOSIS — D3132 Benign neoplasm of left choroid: Secondary | ICD-10-CM

## 2019-01-20 DIAGNOSIS — N3281 Overactive bladder: Secondary | ICD-10-CM | POA: Diagnosis not present

## 2019-01-20 DIAGNOSIS — R634 Abnormal weight loss: Secondary | ICD-10-CM | POA: Diagnosis not present

## 2019-01-20 DIAGNOSIS — Z1389 Encounter for screening for other disorder: Secondary | ICD-10-CM | POA: Diagnosis not present

## 2019-01-20 DIAGNOSIS — I10 Essential (primary) hypertension: Secondary | ICD-10-CM | POA: Diagnosis not present

## 2019-01-20 DIAGNOSIS — E7800 Pure hypercholesterolemia, unspecified: Secondary | ICD-10-CM | POA: Diagnosis not present

## 2019-01-20 DIAGNOSIS — Z0000 Encounter for general adult medical examination without abnormal findings: Secondary | ICD-10-CM | POA: Diagnosis not present

## 2019-01-22 ENCOUNTER — Encounter (INDEPENDENT_AMBULATORY_CARE_PROVIDER_SITE_OTHER): Payer: PPO | Admitting: Ophthalmology

## 2019-01-22 ENCOUNTER — Other Ambulatory Visit: Payer: Self-pay

## 2019-01-22 DIAGNOSIS — D3132 Benign neoplasm of left choroid: Secondary | ICD-10-CM | POA: Diagnosis not present

## 2019-01-22 DIAGNOSIS — H35033 Hypertensive retinopathy, bilateral: Secondary | ICD-10-CM | POA: Diagnosis not present

## 2019-01-22 DIAGNOSIS — H43813 Vitreous degeneration, bilateral: Secondary | ICD-10-CM

## 2019-01-22 DIAGNOSIS — H353232 Exudative age-related macular degeneration, bilateral, with inactive choroidal neovascularization: Secondary | ICD-10-CM | POA: Diagnosis not present

## 2019-01-22 DIAGNOSIS — I10 Essential (primary) hypertension: Secondary | ICD-10-CM | POA: Diagnosis not present

## 2019-02-13 DIAGNOSIS — L570 Actinic keratosis: Secondary | ICD-10-CM | POA: Diagnosis not present

## 2019-02-13 DIAGNOSIS — C44629 Squamous cell carcinoma of skin of left upper limb, including shoulder: Secondary | ICD-10-CM | POA: Diagnosis not present

## 2019-02-13 DIAGNOSIS — L821 Other seborrheic keratosis: Secondary | ICD-10-CM | POA: Diagnosis not present

## 2019-02-13 DIAGNOSIS — D044 Carcinoma in situ of skin of scalp and neck: Secondary | ICD-10-CM | POA: Diagnosis not present

## 2019-02-13 DIAGNOSIS — Z85828 Personal history of other malignant neoplasm of skin: Secondary | ICD-10-CM | POA: Diagnosis not present

## 2019-02-13 DIAGNOSIS — D0439 Carcinoma in situ of skin of other parts of face: Secondary | ICD-10-CM | POA: Diagnosis not present

## 2019-02-13 DIAGNOSIS — Z85820 Personal history of malignant melanoma of skin: Secondary | ICD-10-CM | POA: Diagnosis not present

## 2019-02-19 ENCOUNTER — Encounter (INDEPENDENT_AMBULATORY_CARE_PROVIDER_SITE_OTHER): Payer: PPO | Admitting: Ophthalmology

## 2019-02-19 ENCOUNTER — Other Ambulatory Visit: Payer: Self-pay

## 2019-02-19 DIAGNOSIS — H353231 Exudative age-related macular degeneration, bilateral, with active choroidal neovascularization: Secondary | ICD-10-CM

## 2019-02-19 DIAGNOSIS — H35033 Hypertensive retinopathy, bilateral: Secondary | ICD-10-CM | POA: Diagnosis not present

## 2019-02-19 DIAGNOSIS — H43813 Vitreous degeneration, bilateral: Secondary | ICD-10-CM | POA: Diagnosis not present

## 2019-02-19 DIAGNOSIS — I10 Essential (primary) hypertension: Secondary | ICD-10-CM

## 2019-02-19 DIAGNOSIS — D3132 Benign neoplasm of left choroid: Secondary | ICD-10-CM

## 2019-03-19 ENCOUNTER — Encounter (INDEPENDENT_AMBULATORY_CARE_PROVIDER_SITE_OTHER): Payer: PPO | Admitting: Ophthalmology

## 2019-03-19 ENCOUNTER — Other Ambulatory Visit: Payer: Self-pay

## 2019-03-19 DIAGNOSIS — H35033 Hypertensive retinopathy, bilateral: Secondary | ICD-10-CM

## 2019-03-19 DIAGNOSIS — I10 Essential (primary) hypertension: Secondary | ICD-10-CM

## 2019-03-19 DIAGNOSIS — H353231 Exudative age-related macular degeneration, bilateral, with active choroidal neovascularization: Secondary | ICD-10-CM

## 2019-03-19 DIAGNOSIS — H43813 Vitreous degeneration, bilateral: Secondary | ICD-10-CM

## 2019-03-19 DIAGNOSIS — D3132 Benign neoplasm of left choroid: Secondary | ICD-10-CM | POA: Diagnosis not present

## 2019-04-23 ENCOUNTER — Encounter (INDEPENDENT_AMBULATORY_CARE_PROVIDER_SITE_OTHER): Payer: PPO | Admitting: Ophthalmology

## 2019-04-23 DIAGNOSIS — H43813 Vitreous degeneration, bilateral: Secondary | ICD-10-CM

## 2019-04-23 DIAGNOSIS — H353231 Exudative age-related macular degeneration, bilateral, with active choroidal neovascularization: Secondary | ICD-10-CM | POA: Diagnosis not present

## 2019-04-23 DIAGNOSIS — I10 Essential (primary) hypertension: Secondary | ICD-10-CM | POA: Diagnosis not present

## 2019-04-23 DIAGNOSIS — H35033 Hypertensive retinopathy, bilateral: Secondary | ICD-10-CM | POA: Diagnosis not present

## 2019-05-28 ENCOUNTER — Encounter (INDEPENDENT_AMBULATORY_CARE_PROVIDER_SITE_OTHER): Payer: PPO | Admitting: Ophthalmology

## 2019-05-28 DIAGNOSIS — H35033 Hypertensive retinopathy, bilateral: Secondary | ICD-10-CM

## 2019-05-28 DIAGNOSIS — H353231 Exudative age-related macular degeneration, bilateral, with active choroidal neovascularization: Secondary | ICD-10-CM | POA: Diagnosis not present

## 2019-05-28 DIAGNOSIS — D3132 Benign neoplasm of left choroid: Secondary | ICD-10-CM | POA: Diagnosis not present

## 2019-05-28 DIAGNOSIS — I10 Essential (primary) hypertension: Secondary | ICD-10-CM

## 2019-05-28 DIAGNOSIS — H43813 Vitreous degeneration, bilateral: Secondary | ICD-10-CM

## 2019-06-04 DIAGNOSIS — E7800 Pure hypercholesterolemia, unspecified: Secondary | ICD-10-CM | POA: Diagnosis not present

## 2019-06-04 DIAGNOSIS — M8580 Other specified disorders of bone density and structure, unspecified site: Secondary | ICD-10-CM | POA: Diagnosis not present

## 2019-06-04 DIAGNOSIS — I10 Essential (primary) hypertension: Secondary | ICD-10-CM | POA: Diagnosis not present

## 2019-07-16 ENCOUNTER — Other Ambulatory Visit: Payer: Self-pay

## 2019-07-16 ENCOUNTER — Encounter (INDEPENDENT_AMBULATORY_CARE_PROVIDER_SITE_OTHER): Payer: PPO | Admitting: Ophthalmology

## 2019-07-16 DIAGNOSIS — H43813 Vitreous degeneration, bilateral: Secondary | ICD-10-CM

## 2019-07-16 DIAGNOSIS — H35033 Hypertensive retinopathy, bilateral: Secondary | ICD-10-CM | POA: Diagnosis not present

## 2019-07-16 DIAGNOSIS — H353231 Exudative age-related macular degeneration, bilateral, with active choroidal neovascularization: Secondary | ICD-10-CM

## 2019-07-16 DIAGNOSIS — D3132 Benign neoplasm of left choroid: Secondary | ICD-10-CM

## 2019-07-16 DIAGNOSIS — I10 Essential (primary) hypertension: Secondary | ICD-10-CM | POA: Diagnosis not present

## 2019-08-14 DIAGNOSIS — Z85828 Personal history of other malignant neoplasm of skin: Secondary | ICD-10-CM | POA: Diagnosis not present

## 2019-08-14 DIAGNOSIS — L820 Inflamed seborrheic keratosis: Secondary | ICD-10-CM | POA: Diagnosis not present

## 2019-08-14 DIAGNOSIS — L570 Actinic keratosis: Secondary | ICD-10-CM | POA: Diagnosis not present

## 2019-08-14 DIAGNOSIS — Z85820 Personal history of malignant melanoma of skin: Secondary | ICD-10-CM | POA: Diagnosis not present

## 2019-08-14 DIAGNOSIS — D692 Other nonthrombocytopenic purpura: Secondary | ICD-10-CM | POA: Diagnosis not present

## 2019-08-14 DIAGNOSIS — L821 Other seborrheic keratosis: Secondary | ICD-10-CM | POA: Diagnosis not present

## 2019-08-17 DIAGNOSIS — E7800 Pure hypercholesterolemia, unspecified: Secondary | ICD-10-CM | POA: Diagnosis not present

## 2019-08-17 DIAGNOSIS — M8580 Other specified disorders of bone density and structure, unspecified site: Secondary | ICD-10-CM | POA: Diagnosis not present

## 2019-08-17 DIAGNOSIS — I10 Essential (primary) hypertension: Secondary | ICD-10-CM | POA: Diagnosis not present

## 2019-09-02 ENCOUNTER — Emergency Department (HOSPITAL_COMMUNITY)
Admission: EM | Admit: 2019-09-02 | Discharge: 2019-09-03 | Disposition: A | Discharge status: Home / Self Care | Payer: PPO | Attending: Emergency Medicine | Admitting: Emergency Medicine

## 2019-09-02 ENCOUNTER — Other Ambulatory Visit: Payer: Self-pay

## 2019-09-02 DIAGNOSIS — I10 Essential (primary) hypertension: Secondary | ICD-10-CM | POA: Diagnosis not present

## 2019-09-02 DIAGNOSIS — I6782 Cerebral ischemia: Secondary | ICD-10-CM | POA: Diagnosis not present

## 2019-09-02 DIAGNOSIS — S0990XA Unspecified injury of head, initial encounter: Secondary | ICD-10-CM | POA: Diagnosis not present

## 2019-09-02 DIAGNOSIS — I6789 Other cerebrovascular disease: Secondary | ICD-10-CM | POA: Diagnosis not present

## 2019-09-02 DIAGNOSIS — R531 Weakness: Secondary | ICD-10-CM | POA: Diagnosis not present

## 2019-09-02 DIAGNOSIS — W19XXXA Unspecified fall, initial encounter: Secondary | ICD-10-CM | POA: Insufficient documentation

## 2019-09-02 DIAGNOSIS — R42 Dizziness and giddiness: Secondary | ICD-10-CM | POA: Diagnosis not present

## 2019-09-02 DIAGNOSIS — S50311A Abrasion of right elbow, initial encounter: Secondary | ICD-10-CM | POA: Diagnosis not present

## 2019-09-02 DIAGNOSIS — Z79899 Other long term (current) drug therapy: Secondary | ICD-10-CM | POA: Insufficient documentation

## 2019-09-02 DIAGNOSIS — R0902 Hypoxemia: Secondary | ICD-10-CM | POA: Diagnosis not present

## 2019-09-02 DIAGNOSIS — S79911A Unspecified injury of right hip, initial encounter: Secondary | ICD-10-CM | POA: Diagnosis not present

## 2019-09-02 DIAGNOSIS — M25561 Pain in right knee: Secondary | ICD-10-CM | POA: Diagnosis not present

## 2019-09-02 DIAGNOSIS — Z20822 Contact with and (suspected) exposure to covid-19: Secondary | ICD-10-CM | POA: Insufficient documentation

## 2019-09-02 DIAGNOSIS — G9389 Other specified disorders of brain: Secondary | ICD-10-CM | POA: Diagnosis not present

## 2019-09-02 DIAGNOSIS — S8991XA Unspecified injury of right lower leg, initial encounter: Secondary | ICD-10-CM | POA: Diagnosis not present

## 2019-09-02 DIAGNOSIS — M25461 Effusion, right knee: Secondary | ICD-10-CM | POA: Diagnosis not present

## 2019-09-02 DIAGNOSIS — R52 Pain, unspecified: Secondary | ICD-10-CM | POA: Diagnosis not present

## 2019-09-02 DIAGNOSIS — M25551 Pain in right hip: Secondary | ICD-10-CM | POA: Diagnosis not present

## 2019-09-02 DIAGNOSIS — R9082 White matter disease, unspecified: Secondary | ICD-10-CM | POA: Diagnosis not present

## 2019-09-02 DIAGNOSIS — J3489 Other specified disorders of nose and nasal sinuses: Secondary | ICD-10-CM | POA: Diagnosis not present

## 2019-09-02 NOTE — ED Triage Notes (Signed)
EMS was called to pt residence where she lives alone for an unwitnessed fall resulting in pain to the right lateral hip with palpation. Also noted from family that pt has been leaning over to the right and was having some weakness with her RUE made evident at dinner when the pt repeated missed putting her food into her mouth and instead was sticky her cheek with her fork. Pt apparently gets dizzy when she takes her BP meds and has become increasingly weak from a medication that she has been taking for her macular degeneration for the past year.

## 2019-09-03 ENCOUNTER — Emergency Department (HOSPITAL_COMMUNITY): Payer: PPO

## 2019-09-03 ENCOUNTER — Emergency Department (HOSPITAL_COMMUNITY)
Admission: EM | Admit: 2019-09-03 | Discharge: 2019-09-05 | Disposition: A | Discharge status: Skilled Nursing Facility | Payer: PPO | Source: Home / Self Care | Attending: Emergency Medicine | Admitting: Emergency Medicine

## 2019-09-03 ENCOUNTER — Encounter (INDEPENDENT_AMBULATORY_CARE_PROVIDER_SITE_OTHER): Payer: PPO | Admitting: Ophthalmology

## 2019-09-03 ENCOUNTER — Encounter (HOSPITAL_COMMUNITY): Payer: Self-pay | Admitting: *Deleted

## 2019-09-03 DIAGNOSIS — M25461 Effusion, right knee: Secondary | ICD-10-CM | POA: Diagnosis not present

## 2019-09-03 DIAGNOSIS — R9082 White matter disease, unspecified: Secondary | ICD-10-CM | POA: Diagnosis not present

## 2019-09-03 DIAGNOSIS — Z7401 Bed confinement status: Secondary | ICD-10-CM | POA: Diagnosis not present

## 2019-09-03 DIAGNOSIS — W19XXXA Unspecified fall, initial encounter: Secondary | ICD-10-CM | POA: Diagnosis not present

## 2019-09-03 DIAGNOSIS — R531 Weakness: Secondary | ICD-10-CM

## 2019-09-03 DIAGNOSIS — M2550 Pain in unspecified joint: Secondary | ICD-10-CM | POA: Diagnosis not present

## 2019-09-03 DIAGNOSIS — I6782 Cerebral ischemia: Secondary | ICD-10-CM | POA: Diagnosis not present

## 2019-09-03 DIAGNOSIS — J3489 Other specified disorders of nose and nasal sinuses: Secondary | ICD-10-CM | POA: Diagnosis not present

## 2019-09-03 DIAGNOSIS — S8991XA Unspecified injury of right lower leg, initial encounter: Secondary | ICD-10-CM | POA: Diagnosis not present

## 2019-09-03 DIAGNOSIS — G9389 Other specified disorders of brain: Secondary | ICD-10-CM | POA: Diagnosis not present

## 2019-09-03 DIAGNOSIS — I10 Essential (primary) hypertension: Secondary | ICD-10-CM | POA: Diagnosis not present

## 2019-09-03 DIAGNOSIS — Z20822 Contact with and (suspected) exposure to covid-19: Secondary | ICD-10-CM | POA: Diagnosis not present

## 2019-09-03 DIAGNOSIS — S79911A Unspecified injury of right hip, initial encounter: Secondary | ICD-10-CM | POA: Diagnosis not present

## 2019-09-03 DIAGNOSIS — S0990XA Unspecified injury of head, initial encounter: Secondary | ICD-10-CM | POA: Diagnosis not present

## 2019-09-03 DIAGNOSIS — S50311A Abrasion of right elbow, initial encounter: Secondary | ICD-10-CM | POA: Diagnosis not present

## 2019-09-03 DIAGNOSIS — I959 Hypotension, unspecified: Secondary | ICD-10-CM | POA: Diagnosis not present

## 2019-09-03 LAB — URINALYSIS, ROUTINE W REFLEX MICROSCOPIC
Bilirubin Urine: NEGATIVE
Bilirubin Urine: NEGATIVE
Glucose, UA: NEGATIVE mg/dL
Glucose, UA: NEGATIVE mg/dL
Hgb urine dipstick: NEGATIVE
Hgb urine dipstick: NEGATIVE
Ketones, ur: NEGATIVE mg/dL
Ketones, ur: NEGATIVE mg/dL
Leukocytes,Ua: NEGATIVE
Leukocytes,Ua: NEGATIVE
Nitrite: NEGATIVE
Nitrite: NEGATIVE
Protein, ur: NEGATIVE mg/dL
Protein, ur: NEGATIVE mg/dL
Specific Gravity, Urine: 1.01 (ref 1.005–1.030)
Specific Gravity, Urine: 1.008 (ref 1.005–1.030)
pH: 7 (ref 5.0–8.0)
pH: 7 (ref 5.0–8.0)

## 2019-09-03 LAB — RAPID URINE DRUG SCREEN, HOSP PERFORMED
Amphetamines: NOT DETECTED
Amphetamines: NOT DETECTED
Barbiturates: NOT DETECTED
Barbiturates: NOT DETECTED
Benzodiazepines: NOT DETECTED
Benzodiazepines: NOT DETECTED
Cocaine: NOT DETECTED
Cocaine: NOT DETECTED
Opiates: NOT DETECTED
Opiates: NOT DETECTED
Tetrahydrocannabinol: NOT DETECTED
Tetrahydrocannabinol: NOT DETECTED

## 2019-09-03 LAB — CBC
HCT: 35.2 % — ABNORMAL LOW (ref 36.0–46.0)
HCT: 32.8 % — ABNORMAL LOW (ref 36.0–46.0)
Hemoglobin: 11.2 g/dL — ABNORMAL LOW (ref 12.0–15.0)
Hemoglobin: 10.3 g/dL — ABNORMAL LOW (ref 12.0–15.0)
MCH: 29.2 pg (ref 26.0–34.0)
MCH: 29.3 pg (ref 26.0–34.0)
MCHC: 31.8 g/dL (ref 30.0–36.0)
MCHC: 31.4 g/dL (ref 30.0–36.0)
MCV: 91.7 fL (ref 80.0–100.0)
MCV: 93.2 fL (ref 80.0–100.0)
Platelets: 289 10*3/uL (ref 150–400)
Platelets: 285 10*3/uL (ref 150–400)
RBC: 3.84 MIL/uL — ABNORMAL LOW (ref 3.87–5.11)
RBC: 3.52 MIL/uL — ABNORMAL LOW (ref 3.87–5.11)
RDW: 12.9 % (ref 11.5–15.5)
RDW: 12.8 % (ref 11.5–15.5)
WBC: 8.6 10*3/uL (ref 4.0–10.5)
WBC: 7.5 10*3/uL (ref 4.0–10.5)
nRBC: 0 % (ref 0.0–0.2)
nRBC: 0 % (ref 0.0–0.2)

## 2019-09-03 LAB — COMPREHENSIVE METABOLIC PANEL
ALT: 28 U/L (ref 0–44)
ALT: 23 U/L (ref 0–44)
AST: 31 U/L (ref 15–41)
AST: 29 U/L (ref 15–41)
Albumin: 3.8 g/dL (ref 3.5–5.0)
Albumin: 3.1 g/dL — ABNORMAL LOW (ref 3.5–5.0)
Alkaline Phosphatase: 54 U/L (ref 38–126)
Alkaline Phosphatase: 47 U/L (ref 38–126)
Anion gap: 6 (ref 5–15)
Anion gap: 8 (ref 5–15)
BUN: 34 mg/dL — ABNORMAL HIGH (ref 8–23)
BUN: 23 mg/dL (ref 8–23)
CO2: 27 mmol/L (ref 22–32)
CO2: 25 mmol/L (ref 22–32)
Calcium: 9.7 mg/dL (ref 8.9–10.3)
Calcium: 9.1 mg/dL (ref 8.9–10.3)
Chloride: 105 mmol/L (ref 98–111)
Chloride: 108 mmol/L (ref 98–111)
Creatinine, Ser: 0.8 mg/dL (ref 0.44–1.00)
Creatinine, Ser: 0.84 mg/dL (ref 0.44–1.00)
GFR calc Af Amer: >60 mL/min (ref >60)
GFR calc Af Amer: >60 mL/min (ref >60)
GFR calc non Af Amer: >60 mL/min (ref >60)
GFR calc non Af Amer: >60 mL/min (ref >60)
Glucose, Bld: 106 mg/dL — ABNORMAL HIGH (ref 70–99)
Glucose, Bld: 90 mg/dL (ref 70–99)
Potassium: 3.7 mmol/L (ref 3.5–5.1)
Potassium: 3.9 mmol/L (ref 3.5–5.1)
Sodium: 138 mmol/L (ref 135–145)
Sodium: 141 mmol/L (ref 135–145)
Total Bilirubin: 0.7 mg/dL (ref 0.3–1.2)
Total Bilirubin: 0.4 mg/dL (ref 0.3–1.2)
Total Protein: 6.5 g/dL (ref 6.5–8.1)
Total Protein: 5.4 g/dL — ABNORMAL LOW (ref 6.5–8.1)

## 2019-09-03 LAB — I-STAT CHEM 8, ED
BUN: 25 mg/dL — ABNORMAL HIGH (ref 8–23)
Calcium, Ion: 1.29 mmol/L (ref 1.15–1.40)
Chloride: 105 mmol/L (ref 98–111)
Creatinine, Ser: 0.8 mg/dL (ref 0.44–1.00)
Glucose, Bld: 86 mg/dL (ref 70–99)
HCT: 29 % — ABNORMAL LOW (ref 36.0–46.0)
Hemoglobin: 9.9 g/dL — ABNORMAL LOW (ref 12.0–15.0)
Potassium: 3.9 mmol/L (ref 3.5–5.1)
Sodium: 143 mmol/L (ref 135–145)
TCO2: 25 mmol/L (ref 22–32)

## 2019-09-03 LAB — DIFFERENTIAL
Abs Immature Granulocytes: 0.01 10*3/uL (ref 0.00–0.07)
Abs Immature Granulocytes: 0.02 10*3/uL (ref 0.00–0.07)
Basophils Absolute: 0.1 10*3/uL (ref 0.0–0.1)
Basophils Absolute: 0.1 10*3/uL (ref 0.0–0.1)
Basophils Relative: 1 %
Basophils Relative: 1 %
Eosinophils Absolute: 0.1 10*3/uL (ref 0.0–0.5)
Eosinophils Absolute: 0 10*3/uL (ref 0.0–0.5)
Eosinophils Relative: 1 %
Eosinophils Relative: 0 %
Immature Granulocytes: 0 %
Immature Granulocytes: 0 %
Lymphocytes Relative: 15 %
Lymphocytes Relative: 13 %
Lymphs Abs: 1.3 10*3/uL (ref 0.7–4.0)
Lymphs Abs: 1 10*3/uL (ref 0.7–4.0)
Monocytes Absolute: 0.6 10*3/uL (ref 0.1–1.0)
Monocytes Absolute: 0.7 10*3/uL (ref 0.1–1.0)
Monocytes Relative: 7 %
Monocytes Relative: 10 %
Neutro Abs: 6.6 10*3/uL (ref 1.7–7.7)
Neutro Abs: 5.6 10*3/uL (ref 1.7–7.7)
Neutrophils Relative %: 76 %
Neutrophils Relative %: 76 %

## 2019-09-03 LAB — CBG MONITORING, ED: Glucose-Capillary: 75 mg/dL (ref 70–99)

## 2019-09-03 LAB — PROTIME-INR
INR: 1 (ref 0.8–1.2)
INR: 1.1 (ref 0.8–1.2)
Prothrombin Time: 12.4 seconds (ref 11.4–15.2)
Prothrombin Time: 14 seconds (ref 11.4–15.2)

## 2019-09-03 LAB — APTT
aPTT: 28 seconds (ref 24–36)
aPTT: 26 seconds (ref 24–36)

## 2019-09-03 LAB — ETHANOL: Alcohol, Ethyl (B): <10 mg/dL (ref <10)

## 2019-09-03 MED ORDER — SODIUM CHLORIDE 0.9 % IV BOLUS
1000.0000 mL | Freq: Once | INTRAVENOUS | Status: AC
  Administered 2019-09-03: 1000 mL via INTRAVENOUS

## 2019-09-03 MED ORDER — SODIUM CHLORIDE 0.9 % IV SOLN
100.0000 mL/h | INTRAVENOUS | Status: DC
  Administered 2019-09-03: 100 mL/h via INTRAVENOUS

## 2019-09-03 MED ORDER — SODIUM CHLORIDE 0.9 % IV BOLUS
500.0000 mL | Freq: Once | INTRAVENOUS | Status: AC
  Administered 2019-09-03: 500 mL via INTRAVENOUS

## 2019-09-03 NOTE — ED Notes (Signed)
Lanice Folden son [phone] would like to speak with a nurse

## 2019-09-03 NOTE — ED Notes (Signed)
Sylvia Petersen, pt son, to arrange transportation of pt. Son stated that [facility] had brought her some this morning and he would prefer the same arrangements be made for safety. Son requests he be called when [facility] arrives so he can be at the house waiting for her.

## 2019-09-03 NOTE — ED Notes (Signed)
Called ptar 

## 2019-09-03 NOTE — ED Provider Notes (Signed)
Emergency Department Provider Note  I have reviewed the triage vital signs and the nursing notes.  HISTORY  Chief Complaint Fall   HPI Sylvia Petersen is a 86 y.o. female with medical problems as documented below who presents to the emergency department today secondary to a fall.  Soundly patient has had progressively worsening weakness recently had today she got lightheaded which is relatively normal for her she felt hit a wall but landed pretty hard on her right hip.  She hit her head, right elbow and right knee on the wall before falling though.  EMS was called brought here for further evaluation.  Did not lose consciousness.  No recent illnesses.  No focal weakness. No current headache   No other associated or modifying symptoms.    Past Medical History:  Diagnosis Date  . Arthritis   . High cholesterol   . Hypertension     Patient Active Problem List   Diagnosis Date Noted  . Postoperative state 04/25/2016    Past Surgical History:  Procedure Laterality Date  . ABDOMINAL HYSTERECTOMY    . BLADDER SUSPENSION N/A 04/25/2016   Procedure: TRANSVAGINAL TAPE (TVT) PROCEDURE;  Surgeon: Bobbye Charleston, MD;  Location: [facility];  Service: Gynecology;  Laterality: N/A;  . CARDIAC CATHETERIZATION    . CYSTOCELE REPAIR N/A 04/25/2016   Procedure: ANTERIOR REPAIR (CYSTOCELE);  Surgeon: Bobbye Charleston, MD;  Location: [facility];  Service: Gynecology;  Laterality: N/A;  . CYSTOSCOPY N/A 04/25/2016   Procedure: CYSTOSCOPY;  Surgeon: Bobbye Charleston, MD;  Location: [facility];  Service: Gynecology;  Laterality: N/A;  . EYE SURGERY      Current Outpatient Rx  . Order #: [id number] Class: Historical Med  . Order #: [id number] Class: Historical Med  . Order #: [id number] Class: Historical Med  . Order #: [id number] Class: Historical Med  . Order #: [id number] Class: Historical Med  . Order #: [id number] Class: Historical Med  . Order #: [id number] Class: Historical Med    Allergies Patient has no  known allergies.  Family History  Problem Relation Age of Onset  . Cancer Sister     Social History Social History   Tobacco Use  . Smoking status: Never Smoker  . Smokeless tobacco: Never Used  Substance Use Topics  . Alcohol use: No  . Drug use: No    Review of Systems  All other systems negative except as documented in the HPI. All pertinent positives and negatives as reviewed in the HPI. ____________________________________________  PHYSICAL EXAM:  VITAL SIGNS: ED Triage Vitals  Enc Vitals Group     BP 09/02/19 2359 (!) 162/71     Pulse Rate 09/02/19 2359 83     Resp 09/02/19 2359 18     Temp 09/02/19 2359 97.7 F (36.5 C)     Temp src --      SpO2 09/02/19 2358 98 %   Constitutional: Alert and oriented. Well appearing and in no acute distress. Eyes: Conjunctivae are normal. PERRL. EOMI. Head: Atraumatic. Nose: No congestion/rhinnorhea. Mouth/Throat: Mucous membranes are moist.  Oropharynx non-erythematous. Neck: No stridor.  No meningeal signs.   Cardiovascular: Normal rate, regular rhythm. Good peripheral circulation. Grossly normal heart sounds.   Respiratory: Normal respiratory effort.  No retractions. Lungs CTAB. Gastrointestinal: Soft and nontender. No distention.  Musculoskeletal: ttp of right elbow, right fibular area just distal to knee, lateral hip. no rom pain. No leg shortening or rotation.  Neurologic:  Normal speech and language. No gross focal neurologic deficits are appreciated.  Skin:  Skin is warm, dry and intact. No rash noted. ____________________________________________   LABS (all labs ordered are listed, but only abnormal results are displayed)  Labs Reviewed  CBC - Abnormal; Notable for the following components:      Result Value   RBC 3.84 (*)    Hemoglobin 11.2 (*)    HCT 35.2 (*)    All other components within normal limits  COMPREHENSIVE METABOLIC PANEL - Abnormal; Notable for the following components:   Glucose, Bld 106 (*)     BUN 34 (*)    All other components within normal limits  URINALYSIS, ROUTINE W REFLEX MICROSCOPIC - Abnormal; Notable for the following components:   Color, Urine STRAW (*)    All other components within normal limits  ETHANOL  PROTIME-INR  APTT  DIFFERENTIAL  RAPID URINE DRUG SCREEN, HOSP PERFORMED   ____________________________________________  EKG   EKG Interpretation  Date/Time:  Thursday September 03 2019 01:04:48 EDT Ventricular Rate:  81 PR Interval:    QRS Duration: 93 QT Interval:  390 QTC Calculation: 453 R Axis:   35 Text Interpretation: Sinus rhythm Probable left atrial enlargement Abnormal inferior Q waves Confirmed by Merrily Pew [phone]) on 09/03/2019 1:07:56 AM       ____________________________________________  RADIOLOGY  DG Elbow Complete Right  Result Date: 09/03/2019 CLINICAL DATA:  Fall at home this evening landing on right side. Elbow abrasion. EXAM: RIGHT ELBOW - COMPLETE 3+ VIEW COMPARISON:  None. FINDINGS: There is no evidence of fracture, dislocation, or joint effusion. Tiny olecranon spur. There is no other evidence of arthropathy or other focal bone abnormality. Soft tissues are unremarkable. IMPRESSION: No fracture, dislocation, or joint effusion. Electronically Signed   By: Keith Rake M.D.   On: 09/03/2019 01:00   DG Knee 2 Views Right  Result Date: 09/03/2019 CLINICAL DATA:  Fall at home this evening landing on right side. Right knee pain. EXAM: RIGHT KNEE - 1-2 VIEW COMPARISON:  None. FINDINGS: No acute fracture or dislocation. Minor osteoarthritis for age. Trace joint effusion. There is a small quadriceps and patellar tendon enthesophyte. Vascular calcifications. IMPRESSION: No acute fracture or dislocation. Electronically Signed   By: Keith Rake M.D.   On: 09/03/2019 01:04   CT HEAD WO CONTRAST  Result Date: 09/03/2019 CLINICAL DATA:  Fall EXAM: CT HEAD WITHOUT CONTRAST TECHNIQUE: Contiguous axial images were obtained from the  base of the skull through the vertex without intravenous contrast. COMPARISON:  None. FINDINGS: Brain: There is no mass, hemorrhage or extra-axial collection. The size and configuration of the ventricles and extra-axial CSF spaces are normal. There is hypoattenuation of the white matter, most commonly indicating chronic small vessel disease. Vascular: No abnormal hyperdensity of the major intracranial arteries or dural venous sinuses. No intracranial atherosclerosis. Skull: The visualized skull base, calvarium and extracranial soft tissues are normal. Sinuses/Orbits: No fluid levels or advanced mucosal thickening of the visualized paranasal sinuses. No mastoid or middle ear effusion. The orbits are normal. IMPRESSION: Chronic small vessel disease without acute intracranial abnormality. Electronically Signed   By: Ulyses Jarred M.D.   On: 09/03/2019 00:46   DG Hip Unilat W or Wo Pelvis 2-3 Views Right  Result Date: 09/03/2019 CLINICAL DATA:  Fall at home this evening landing on right side. EXAM: DG HIP (WITH OR WITHOUT PELVIS) 2-3V RIGHT COMPARISON:  None. FINDINGS: The cortical margins of the bony pelvis and right hip are intact. No fracture. Pubic rami are intact. Pubic symphysis and sacroiliac joints are congruent. Both femoral  heads are well-seated in the respective acetabula. IMPRESSION: No evidence of pelvic or right hip fracture. Electronically Signed   By: Keith Rake M.D.   On: 09/03/2019 01:02   ____________________________________________  PROCEDURES  Procedure(s) performed:   Procedures ____________________________________________  INITIAL IMPRESSION / ASSESSMENT AND PLAN / ED COURSE   This patient presents to the ED for concern of fall with msk complaints, this involves an extensive number of treatment options, and is a complaint that carries with it a high risk of complications and morbidity.  The differential diagnosis includes fractures, muscular pain, contusion.     Lab  Tests:   I Ordered, reviewed, and interpreted labs, which included cbc/ethanol, cmp, UDS, UA and all were reassuring   Medicines ordered:   None needed  Imaging Studies ordered:   I independently visualized and interpreted imaging XR's of affected body parts which showed no obvious fractures  Reevaluation:  After the interventions stated above, I reevaluated the patient and found able to ambulate at what seems to be baseline without intervention. No treatable conditions found. Stable for discharge.   A medical screening exam was performed and I feel the patient has had an appropriate workup for their chief complaint at this time and likelihood of emergent condition existing is low. They have been counseled on decision, discharge, follow up and which symptoms necessitate immediate return to the emergency department. They or their family verbally stated understanding and agreement with plan and discharged in stable condition.   ____________________________________________  FINAL CLINICAL IMPRESSION(S) / ED DIAGNOSES  Final diagnoses:  Fall, initial encounter    MEDICATIONS GIVEN DURING THIS VISIT:  Medications  sodium chloride 0.9 % bolus 1,000 mL (0 mLs Intravenous Stopped 09/03/19 0510)    NEW OUTPATIENT MEDICATIONS STARTED DURING THIS VISIT:  Discharge Medication List as of 09/03/2019  5:17 AM      Note:  This note was prepared with assistance of Dragon voice recognition software. Occasional wrong-word or sound-a-like substitutions may have occurred due to the inherent limitations of voice recognition software.   Tanasia Budzinski, Corene Cornea, MD 09/03/19 0730

## 2019-09-03 NOTE — ED Notes (Signed)
Pt. Ambulated down the hall and back to her room with one assist because she uses a walker at home. Pt. Gait steady on her feet.

## 2019-09-03 NOTE — ED Notes (Signed)
Dinner ordered 

## 2019-09-03 NOTE — ED Notes (Signed)
Pt transported to MRI 

## 2019-09-03 NOTE — ED Notes (Signed)
Spoke with Gicela, [place], [phone]. Son feels that mother should not be discharged and that she is not able to take care of herself at home. He is concerned that she will fall and get hurt again. States that she can hardly sit up by herself. Pt states that she is fine at home and that she can manage herself at home. States that she has a cane and a walker at home to help her. Also states that her brother and his wife come over to help her. Awaiting [facility] to transport pt home.

## 2019-09-03 NOTE — Discharge Instructions (Addendum)
As discussed, your evaluation today has been largely reassuring.  But, it is important that you monitor your condition carefully, and do not hesitate to return to the ED if you develop new, or concerning changes in your condition.  Otherwise, please follow-up with your physician for appropriate ongoing care.  Please discuss additional therapeutic options including physical therapy, rehabilitation with her.

## 2019-09-03 NOTE — ED Provider Notes (Signed)
[facility] EMERGENCY DEPARTMENT Provider Note   CSN: [id number] Arrival date & time: 09/03/19  1226     History Chief Complaint  Patient presents with  . Weakness    Sylvia Petersen is a 86 y.o. female.  HPI    Patient presents with cough patient was seen and evaluated overnight within the past 24 hours after a fall.  Patient lives in a facility, and reportedly upon returning home, with concern for persistent weakness that began about 4 days ago, she was sent back for evaluation. The patient self notes that she has newly had difficulty walking, using her right arm, and feels unstable. She denies new speech difficulty, new confusion, new facial asymmetry. Seemingly no new medication, diet, activity. History is obtained by the patient, EMS providers note that the patient has had weakness, as above, but otherwise no new complaints. History also obtained via chart review, with notation from today's evaluation with unremarkable head CT, labs. Past Medical History:  Diagnosis Date  . Arthritis   . High cholesterol   . Hypertension     Patient Active Problem List   Diagnosis Date Noted  . Postoperative state 04/25/2016    Past Surgical History:  Procedure Laterality Date  . ABDOMINAL HYSTERECTOMY    . BLADDER SUSPENSION N/A 04/25/2016   Procedure: TRANSVAGINAL TAPE (TVT) PROCEDURE;  Surgeon: Bobbye Charleston, MD;  Location: [facility];  Service: Gynecology;  Laterality: N/A;  . CARDIAC CATHETERIZATION    . CYSTOCELE REPAIR N/A 04/25/2016   Procedure: ANTERIOR REPAIR (CYSTOCELE);  Surgeon: Bobbye Charleston, MD;  Location: [facility];  Service: Gynecology;  Laterality: N/A;  . CYSTOSCOPY N/A 04/25/2016   Procedure: CYSTOSCOPY;  Surgeon: Bobbye Charleston, MD;  Location: [facility];  Service: Gynecology;  Laterality: N/A;  . EYE SURGERY       OB History   No obstetric history on file.     Family History  Problem Relation Age of Onset  . Cancer Sister     Social  History   Tobacco Use  . Smoking status: Never Smoker  . Smokeless tobacco: Never Used  Substance Use Topics  . Alcohol use: No  . Drug use: No    Home Medications Prior to Admission medications   Medication Sig Start Date End Date Taking? Authorizing Provider  acetaminophen (TYLENOL) 500 MG tablet Take 1,000 mg by mouth every 6 (six) hours as needed for mild pain.    [provider]  amLODipine (NORVASC) 2.5 MG tablet Take 2.5 mg by mouth daily.    [provider]  ibuprofen (ADVIL,MOTRIN) 200 MG tablet Take 400 mg by mouth every 6 (six) hours as needed for mild pain.    [provider]  lisinopril (PRINIVIL,ZESTRIL) 20 MG tablet Take 20 mg by mouth at bedtime.     [provider]  lovastatin (MEVACOR) 20 MG tablet Take 20 mg by mouth daily at 6 PM.     [provider]  metoprolol (LOPRESSOR) 50 MG tablet Take 50 mg by mouth 2 (two) times daily.    [provider]  Multiple Vitamin (MULTIVITAMIN WITH MINERALS) TABS Take 1 tablet by mouth daily.    [provider]    Allergies    Patient has no known allergies.  Review of Systems   Review of Systems  Constitutional:       Per HPI, otherwise negative  HENT:       Per HPI, otherwise negative  Respiratory:  Per HPI, otherwise negative  Cardiovascular:       Per HPI, otherwise negative  Gastrointestinal: Negative for vomiting.  Endocrine:       Negative aside from HPI  Genitourinary:       Neg aside from HPI   Musculoskeletal:       Per HPI, otherwise negative  Skin: Negative.   Neurological: Positive for weakness. Negative for syncope.    Physical Exam Updated Vital Signs BP 133/87 (BP Location: Right Arm)   Pulse 75   Temp 98 F (36.7 C) (Oral)   Resp 16   SpO2 100%   Physical Exam Vitals and nursing note reviewed.  Constitutional:      Appearance: She is well-developed. She is ill-appearing.     Comments: Chronically ill in appearance elderly  female awake and alert  HENT:     Head: Normocephalic and atraumatic.  Eyes:     Conjunctiva/sclera: Conjunctivae normal.  Cardiovascular:     Rate and Rhythm: Normal rate and regular rhythm.  Pulmonary:     Effort: Pulmonary effort is normal. No respiratory distress.     Breath sounds: Normal breath sounds. No stridor.  Abdominal:     General: There is no distension.  Skin:    General: Skin is warm and dry.  Neurological:     Mental Status: She is alert and oriented to person, place, and time.     Cranial Nerves: Cranial nerves are intact. No cranial nerve deficit or dysarthria.     Motor: Weakness and atrophy present. No tremor.     Gait: Gait abnormal.     Comments: Both UE / LE 4/5 grossly w equal grip strength     ED Results / Procedures / Treatments   Labs (all labs ordered are listed, but only abnormal results are displayed) Labs Reviewed  CBC - Abnormal; Notable for the following components:      Result Value   RBC 3.52 (*)    Hemoglobin 10.3 (*)    HCT 32.8 (*)    All other components within normal limits  COMPREHENSIVE METABOLIC PANEL - Abnormal; Notable for the following components:   Total Protein 5.4 (*)    Albumin 3.1 (*)    All other components within normal limits  URINALYSIS, ROUTINE W REFLEX MICROSCOPIC - Abnormal; Notable for the following components:   Color, Urine STRAW (*)    All other components within normal limits  I-STAT CHEM 8, ED - Abnormal; Notable for the following components:   BUN 25 (*)    Hemoglobin 9.9 (*)    HCT 29.0 (*)    All other components within normal limits  PROTIME-INR  APTT  DIFFERENTIAL  RAPID URINE DRUG SCREEN, HOSP PERFORMED  CBG MONITORING, ED    EKG EKG Interpretation  Date/Time:  Thursday September 03 2019 12:42:01 EDT Ventricular Rate:  71 PR Interval:    QRS Duration: 87 QT Interval:  406 QTC Calculation: 442 R Axis:   34 Text Interpretation: Sinus rhythm Consider left atrial enlargement Artifact Abnormal  ECG Confirmed by Carmin Muskrat [phone]) on 09/03/2019 2:16:52 PM   Radiology DG Elbow Complete Right  Result Date: 09/03/2019 CLINICAL DATA:  Fall at home this evening landing on right side. Elbow abrasion. EXAM: RIGHT ELBOW - COMPLETE 3+ VIEW COMPARISON:  None. FINDINGS: There is no evidence of fracture, dislocation, or joint effusion. Tiny olecranon spur. There is no other evidence of arthropathy or other focal bone abnormality. Soft tissues are unremarkable. IMPRESSION: No fracture,  dislocation, or joint effusion. Electronically Signed   By: Keith Rake M.D.   On: 09/03/2019 01:00   DG Knee 2 Views Right  Result Date: 09/03/2019 CLINICAL DATA:  Fall at home this evening landing on right side. Right knee pain. EXAM: RIGHT KNEE - 1-2 VIEW COMPARISON:  None. FINDINGS: No acute fracture or dislocation. Minor osteoarthritis for age. Trace joint effusion. There is a small quadriceps and patellar tendon enthesophyte. Vascular calcifications. IMPRESSION: No acute fracture or dislocation. Electronically Signed   By: Keith Rake M.D.   On: 09/03/2019 01:04   CT HEAD WO CONTRAST  Result Date: 09/03/2019 CLINICAL DATA:  Fall EXAM: CT HEAD WITHOUT CONTRAST TECHNIQUE: Contiguous axial images were obtained from the base of the skull through the vertex without intravenous contrast. COMPARISON:  None. FINDINGS: Brain: There is no mass, hemorrhage or extra-axial collection. The size and configuration of the ventricles and extra-axial CSF spaces are normal. There is hypoattenuation of the white matter, most commonly indicating chronic small vessel disease. Vascular: No abnormal hyperdensity of the major intracranial arteries or dural venous sinuses. No intracranial atherosclerosis. Skull: The visualized skull base, calvarium and extracranial soft tissues are normal. Sinuses/Orbits: No fluid levels or advanced mucosal thickening of the visualized paranasal sinuses. No mastoid or middle ear effusion. The  orbits are normal. IMPRESSION: Chronic small vessel disease without acute intracranial abnormality. Electronically Signed   By: Ulyses Jarred M.D.   On: 09/03/2019 00:46   DG Hip Unilat W or Wo Pelvis 2-3 Views Right  Result Date: 09/03/2019 CLINICAL DATA:  Fall at home this evening landing on right side. EXAM: DG HIP (WITH OR WITHOUT PELVIS) 2-3V RIGHT COMPARISON:  None. FINDINGS: The cortical margins of the bony pelvis and right hip are intact. No fracture. Pubic rami are intact. Pubic symphysis and sacroiliac joints are congruent. Both femoral heads are well-seated in the respective acetabula. IMPRESSION: No evidence of pelvic or right hip fracture. Electronically Signed   By: Keith Rake M.D.   On: 09/03/2019 01:02   MRI: FINDINGS: Brain: There is no acute infarction or intracranial hemorrhage. There is no intracranial mass, mass effect, or edema. There is no hydrocephalus or extra-axial fluid collection. Prominence of the ventricles and sulci reflects generalized parenchymal volume loss. Patchy and confluent areas of T2 hyperintensity in the supratentorial white are nonspecific but may reflect mild to moderate microvascular ischemic changes.  Vascular: Major vessel flow voids at the skull base are preserved.  Skull and upper cervical spine: Normal marrow signal is preserved.  Sinuses/Orbits: Mild mucosal thickening. Bilateral lens replacements.  Other: Sella is unremarkable. Mastoid air cells are clear.  IMPRESSION: No evidence of recent infarction, intracranial hemorrhage, or mass.  Mild to moderate microvascular ischemic changes.  Procedures Procedures (including critical care time)  Medications Ordered in ED Medications  sodium chloride 0.9 % bolus 500 mL (0 mLs Intravenous Stopped 09/03/19 1509)    Followed by  0.9 %  sodium chloride infusion (100 mL/hr Intravenous New Bag/Given 09/03/19 1514)    ED Course  I have reviewed the triage vital signs and the nursing  notes.  Pertinent labs & imaging results that were available during my care of the patient were reviewed by me and considered in my medical decision making (see chart for details).    3:22 PM Patient awake, alert.  Using both upper extremities with equal strength, both distally with grip, and proximally.  Though there is atrophy, there is no asymmetry.  Face is symmetric as well.  We discussed today's findings including reassuring labs, urinalysis, and MRI with no evidence for acute new stroke. Given this reassuring radiographic study, we discussed options, including admission, versus rehabilitation placement versus discharge to be with her family. Patient states that she does not want to go to rehabilitation, is comfortable going home.  She states that she can follow-up with her primary care physician, and given the aforementioned reassuring findings, this is not unreasonable. Patient discharged in stable condition. MDM Rules/Calculators/A&P MDM Number of Diagnoses or Management Options Weakness: new, needed workup   Amount and/or Complexity of Data Reviewed Clinical lab tests: reviewed Tests in the radiology section of CPT: reviewed Tests in the medicine section of CPT: reviewed Decide to obtain previous medical records or to obtain history from someone other than the patient: yes Obtain history from someone other than the patient: yes Review and summarize past medical records: yes Discuss the patient with other providers: yes Independent visualization of images, tracings, or specimens: yes  Risk of Complications, Morbidity, and/or Mortality Presenting problems: high Diagnostic procedures: high Management options: high  Critical Care Total time providing critical care: < 30 minutes  Patient Progress Patient progress: stable  Final Clinical Impression(s) / ED Diagnoses Final diagnoses:  Weakness     Carmin Muskrat, MD 09/03/19 1529

## 2019-09-03 NOTE — ED Triage Notes (Signed)
Pt arrives via EMS from home with complaints generalized weakness and favoring right side. Pt seen at [facility] yesterday for fall. Pt primary dr suggested she come to [facility] to be evaluated for stroke like symptoms. LSN 08/30/2019 Per EMS family reports decreased oral intake and increased urine output.  Pt alert and oriented X4 at baseline.

## 2019-09-04 NOTE — ED Notes (Signed)
[facility] here to transport pt home. Due to pt unable to take care of herself at home, and family unwilling to sign to care for her, [facility] refusing to transport patient. This RN speaking with [name], pts son, [phone]. [name] states that mother is not able to take care of herself and has been falling a lot lately and is unsure why the hospital is sending her home tonight. Explained to him that pt is of sound mind and is still able to make decisions for herself and wants to go home. Son states even though that is so, she is unable to take care of herself and that they cannot keep taking care of her all the time. EMT's from [facility] spoke with [name] also, asking if he was willing to sign a form from them saying they would take care of her if they bring her home. [name] refused to do so and stated that she needed to remain at the hospital for safety reasons. This RN was also in contact with Ronalee Belts, her other son, and he also agreed as well. Callie, charge RN made aware. Will put in a social work order to be seen in the morning for evaluation. Will monitor pt throughout the night. Pt stable at this time. Pt transferred onto a hospital bed for comfort.

## 2019-09-04 NOTE — Social Work (Addendum)
CSW spoke with Tammy at [facility] in reference to SNF placement at [facility].  Auth #'s are as follow:  [place], [id number] [facility].  CSW contacted [place] at [facility] to provide authorization numbers.  ED RN updated.  Waiting on room number, and where to call report.  Spoke with Irine Seal pt can come 09/05/19 early in the morning.  TOC CSW will need to call [facility] for room number.  She will need the AVS sent over early.  Abhishek Levesque Tarpley-Carter,MSW, LCSW-A, [facility] ED TOC CSW  [phone]

## 2019-09-04 NOTE — Evaluation (Signed)
Physical Therapy Evaluation Patient Details Name: Sylvia Petersen MRN: [id number] DOB: [birth year] Today's Date: 09/04/2019   History of Present Illness  Pt is an 86 y/o female presenting secondary to falls and generalized weakness. PMH includes HTN.   Clinical Impression  Pt presenting with problem above and deficits below. Pt requiring mod A to take a few steps to and from EOB. Pt with posterior lean and complaining of increased R hip pain and unsteadiness. Pt currently lives alone and has had falls at home. Feel she would benefit from SNF level therapies prior to return home. Will continue to follow acutely to maximize functional mobility independence and safety.     Follow Up Recommendations SNF;Supervision/Assistance - 24 hour    Equipment Recommendations  Wheelchair cushion (measurements PT);Wheelchair (measurements PT)    Recommendations for Other Services       Precautions / Restrictions Precautions Precautions: Fall Restrictions Weight Bearing Restrictions: No      Mobility  Bed Mobility               General bed mobility comments: Sitting EOB upon entry   Transfers Overall transfer level: Needs assistance Equipment used: Rolling walker (2 wheeled) Transfers: Sit to/from Stand Sit to Stand: Min assist;From elevated surface         General transfer comment: Min A from elevated surface. Pt with posterior lean and using bed to brace BLEs  Ambulation/Gait Ambulation/Gait assistance: Mod assist Gait Distance (Feet): 1 Feet Assistive device: Rolling walker (2 wheeled) Gait Pattern/deviations: Step-through pattern;Decreased stride length Gait velocity: Decreased   General Gait Details: Posterior lean while ambulating. Mod A for steadying. Decreased weightshift to RLE. Took a few steps to a from [employer]    Modified Rankin (Stroke Patients Only)       Balance Overall balance assessment: Needs  assistance Sitting-balance support: No upper extremity supported;Feet supported Sitting balance-Leahy Scale: Fair     Standing balance support: Bilateral upper extremity supported;During functional activity Standing balance-Leahy Scale: Poor Standing balance comment: Reliant on UE and external support secondary to posterior lean                             Pertinent Vitals/Pain Pain Assessment: Faces Faces Pain Scale: Hurts little more Pain Location: R hip  Pain Descriptors / Indicators: Grimacing;Guarding Pain Intervention(s): Limited activity within patient's tolerance;Monitored during session;Repositioned    Home Living Family/patient expects to be discharged to:: Private residence Living Arrangements: Alone Available Help at Discharge: Friend(s);Available PRN/intermittently Type of Home: House Home Access: Stairs to enter Entrance Stairs-Rails: Right;Left;Can reach both Entrance Stairs-Number of Steps: 3 Home Layout: Other (Comment) (has step down into kitchen) Home Equipment: Walker - 2 wheels      Prior Function Level of Independence: Independent with assistive device(s)         Comments: Prior to two weeks ago, was independent. Has been getting weaker and required use of RW.      Hand Dominance        Extremity/Trunk Assessment   Upper Extremity Assessment Upper Extremity Assessment: Generalized weakness    Lower Extremity Assessment Lower Extremity Assessment: Generalized weakness;RLE deficits/detail RLE Deficits / Details: Reports R hip pain     Cervical / Trunk Assessment Cervical / Trunk Assessment: Kyphotic  Communication   Communication: No difficulties  Cognition Arousal/Alertness: Awake/alert Behavior During Therapy: WFL for tasks assessed/performed  Overall Cognitive Status: Within Functional Limits for tasks assessed                                        General Comments General comments (skin integrity,  edema, etc.): Pt's friend present during session. Educated about recommendations for SNF.     Exercises     Assessment/Plan    PT Assessment Patient needs continued PT services  PT Problem List Decreased strength;Decreased balance;Decreased activity tolerance;Decreased mobility;Decreased knowledge of use of DME;Decreased knowledge of precautions;Pain       PT Treatment Interventions Gait training;DME instruction;Functional mobility training;Therapeutic activities;Therapeutic exercise;Balance training;Patient/family education    PT Goals (Current goals can be found in the Care Plan section)  Acute Rehab PT Goals Patient Stated Goal: to go home PT Goal Formulation: With patient Time For Goal Achievement: 09/18/19 Potential to Achieve Goals: Good    Frequency Min 2X/week   Barriers to discharge Decreased caregiver support      Co-evaluation               AM-PAC PT "6 Clicks" Mobility  Outcome Measure Help needed turning from your back to your side while in a flat bed without using bedrails?: A Little Help needed moving from lying on your back to sitting on the side of a flat bed without using bedrails?: A Lot Help needed moving to and from a bed to a chair (including a wheelchair)?: A Lot Help needed standing up from a chair using your arms (e.g., wheelchair or bedside chair)?: A Little Help needed to walk in hospital room?: A Lot Help needed climbing 3-5 steps with a railing? : Total 6 Click Score: 13    End of Session Equipment Utilized During Treatment: Gait belt Activity Tolerance: Patient limited by pain Patient left: in bed;with call bell/phone within reach;with family/visitor present (sitting EOB) Nurse Communication: Mobility status PT Visit Diagnosis: Unsteadiness on feet (R26.81);Muscle weakness (generalized) (M62.81);History of falling (Z91.81)    Time: 9937-1696 PT Time Calculation (min) (ACUTE ONLY): 20 min   Charges:   PT Evaluation $PT Eval  Moderate Complexity: 1 Mod          Reuel Derby, PT, DPT  Acute Rehabilitation Services  Pager: [phone] Office: [phone]   Rudean Hitt 09/04/2019, 11:19 AM

## 2019-09-04 NOTE — NC FL2 (Addendum)
°  [place] MEDICAID FL2 LEVEL OF CARE SCREENING TOOL     IDENTIFICATION  Patient Name: Sylvia Petersen Birthdate: 04/14/1929 Sex: female Admission Date (Current Location): 09/03/2019  [facility] and [place] Number:  Herbalist and Address:  [facility], [place] [address] [place], [place] [address]      Provider Number: [id number]  Attending Physician Name and Address:  Default, Provider, MD  Relative Name and Phone Number:       Current Level of Care: Hospital Recommended Level of Care: [place] Prior Approval Number:    Date Approved/Denied:   PASRR Number: [id number]  Discharge Plan: SNF    Current Diagnoses: Patient Active Problem List   Diagnosis Date Noted   Postoperative state 04/25/2016    Orientation RESPIRATION BLADDER Height & Weight     Self, Time, Situation, Place  Normal Continent   **Currently too weak to walk** Weight: 141 lb 1.5 oz (64 kg) Height:  5\' 3"  (160 cm)  BEHAVIORAL SYMPTOMS/MOOD NEUROLOGICAL BOWEL NUTRITION STATUS      Continent Diet  AMBULATORY STATUS COMMUNICATION OF NEEDS Skin   Extensive Assist Verbally Normal                       Personal Care Assistance Level of Assistance  Bathing, Dressing, Feeding Bathing Assistance: Limited assistance Feeding assistance: Limited assistance Dressing Assistance: Limited assistance     Functional Limitations Info             SPECIAL CARE FACTORS FREQUENCY  OT (By licensed OT), PT (By licensed PT)     PT Frequency: 5x weekly OT Frequency: 5x weekly            Contractures Contractures Info: Not present    Additional Factors Info                  Current Medications (09/04/2019):  This is the current hospital active medication list No current facility-administered medications for this encounter.   Current Outpatient Medications  Medication Sig Dispense Refill   acetaminophen (TYLENOL) 500 MG tablet Take 1,000 mg  by mouth every 6 (six) hours as needed for mild pain.     amLODipine (NORVASC) 2.5 MG tablet Take 2.5 mg by mouth daily.     ibuprofen (ADVIL,MOTRIN) 200 MG tablet Take 400 mg by mouth every 6 (six) hours as needed for mild pain.     lisinopril (PRINIVIL,ZESTRIL) 20 MG tablet Take 20 mg by mouth at bedtime.      lovastatin (MEVACOR) 20 MG tablet Take 20 mg by mouth daily at 6 PM.      metoprolol (LOPRESSOR) 50 MG tablet Take 50 mg by mouth 2 (two) times daily.     Multiple Vitamin (MULTIVITAMIN WITH MINERALS) TABS Take 1 tablet by mouth daily.       Discharge Medications: Please see discharge summary for a list of discharge medications.  Relevant Imaging Results:  Relevant Lab Results:   Additional Information SSN: [id number]  Archie Endo, LCSW

## 2019-09-04 NOTE — ED Notes (Signed)
Juanda Crumble, brother, [phone] would like an update when available

## 2019-09-04 NOTE — ED Notes (Signed)
Spoke with Dr. Zenia Resides about Son's Ronalee Belts) concerns. Dr. Zenia Resides states that he understands but as long as pt is alert and can make her own decisions, we cannot hold her here against her will and have to let her go home. Pt states herself that she wants to go home and that she can take care of herself.

## 2019-09-04 NOTE — ED Notes (Signed)
Patient denies pain and is resting comfortably.  

## 2019-09-04 NOTE — ED Provider Notes (Signed)
[facility] EMERGENCY DEPARTMENT Provider Note   CSN: [id number] Arrival date & time: 09/03/19  1226     History Chief Complaint  Patient presents with  . Weakness    Sylvia Petersen is a 86 y.o. female.  HPI  Patient is an 86 year old female who was seen in the emergency department after a fall.  Extensive work-up including MRI of brain, blood work, urine.  Patient was medically cleared for discharge yesterday however she was found on PT eval to have shuffling gait and significant enough weakness-thought to be chronic progressive-that she will require SNF placement.     Past Medical History:  Diagnosis Date  . Arthritis   . High cholesterol   . Hypertension     Patient Active Problem List   Diagnosis Date Noted  . Postoperative state 04/25/2016    Past Surgical History:  Procedure Laterality Date  . ABDOMINAL HYSTERECTOMY    . BLADDER SUSPENSION N/A 04/25/2016   Procedure: TRANSVAGINAL TAPE (TVT) PROCEDURE;  Surgeon: Bobbye Charleston, MD;  Location: [facility];  Service: Gynecology;  Laterality: N/A;  . CARDIAC CATHETERIZATION    . CYSTOCELE REPAIR N/A 04/25/2016   Procedure: ANTERIOR REPAIR (CYSTOCELE);  Surgeon: Bobbye Charleston, MD;  Location: [facility];  Service: Gynecology;  Laterality: N/A;  . CYSTOSCOPY N/A 04/25/2016   Procedure: CYSTOSCOPY;  Surgeon: Bobbye Charleston, MD;  Location: [facility];  Service: Gynecology;  Laterality: N/A;  . EYE SURGERY       OB History   No obstetric history on file.     Family History  Problem Relation Age of Onset  . Cancer Sister     Social History   Tobacco Use  . Smoking status: Never Smoker  . Smokeless tobacco: Never Used  Substance Use Topics  . Alcohol use: No  . Drug use: No    Home Medications Prior to Admission medications   Medication Sig Start Date End Date Taking? Authorizing Provider  acetaminophen (TYLENOL) 500 MG tablet Take 1,000 mg by mouth every 6 (six) hours as needed for mild pain.    Yes [provider]  amLODipine (NORVASC) 2.5 MG tablet Take 2.5 mg by mouth daily.   Yes [provider]  ibuprofen (ADVIL,MOTRIN) 200 MG tablet Take 400 mg by mouth every 6 (six) hours as needed for mild pain.   Yes [provider]  lisinopril (PRINIVIL,ZESTRIL) 20 MG tablet Take 20 mg by mouth at bedtime.    Yes [provider]  lovastatin (MEVACOR) 20 MG tablet Take 20 mg by mouth daily at 6 PM.    Yes [provider]  metoprolol (LOPRESSOR) 50 MG tablet Take 50 mg by mouth 2 (two) times daily.   Yes [provider]  Multiple Vitamin (MULTIVITAMIN WITH MINERALS) TABS Take 1 tablet by mouth daily.   Yes [provider]    Allergies    Patient has no known allergies.  Review of Systems   Review of Systems  Constitutional: Negative for chills and fever.  HENT: Negative for congestion.   Eyes: Negative for pain.  Respiratory: Negative for cough and shortness of breath.   Cardiovascular: Negative for chest pain and leg swelling.  Gastrointestinal: Negative for abdominal pain and vomiting.  Genitourinary: Negative for dysuria.  Musculoskeletal: Negative for myalgias.  Skin: Negative for rash.  Neurological: Positive for weakness. Negative for dizziness and headaches.    Physical Exam Updated Vital Signs BP (!) 120/57   Pulse 94   Temp 98.2 F (  36.8 C) (Oral)   Resp 17   Ht 5\' 3"  (1.6 m)   Wt 64 kg   SpO2 97%   BMI 24.99 kg/m   Physical Exam Vitals and nursing note reviewed.  Constitutional:      General: She is not in acute distress.    Appearance: Normal appearance. She is not ill-appearing.     Comments: Pleasant 86 year old female.  No acute distress.  She is sitting comfortably in bed with friend at bedside.  Very thin and frail appearing.  HENT:     Head: Normocephalic and atraumatic.     Mouth/Throat:     Mouth: Mucous membranes are moist.  Eyes:     General: No scleral icterus.       Right eye: No  discharge.        Left eye: No discharge.     Conjunctiva/sclera: Conjunctivae normal.  Cardiovascular:     Rate and Rhythm: Normal rate.  Pulmonary:     Effort: Pulmonary effort is normal.     Breath sounds: Normal breath sounds. No stridor.  Abdominal:     General: Abdomen is flat.     Tenderness: There is no abdominal tenderness.  Musculoskeletal:     Comments: Bilateral lower extremities with 5-/5 strength diffusely.   Skin:    General: Skin is warm and dry.     Capillary Refill: Capillary refill takes less than 2 seconds.  Neurological:     Mental Status: She is alert and oriented to person, place, and time. Mental status is at baseline.     ED Results / Procedures / Treatments   Labs (all labs ordered are listed, but only abnormal results are displayed) Labs Reviewed  CBC - Abnormal; Notable for the following components:      Result Value   RBC 3.52 (*)    Hemoglobin 10.3 (*)    HCT 32.8 (*)    All other components within normal limits  COMPREHENSIVE METABOLIC PANEL - Abnormal; Notable for the following components:   Total Protein 5.4 (*)    Albumin 3.1 (*)    All other components within normal limits  URINALYSIS, ROUTINE W REFLEX MICROSCOPIC - Abnormal; Notable for the following components:   Color, Urine STRAW (*)    All other components within normal limits  I-STAT CHEM 8, ED - Abnormal; Notable for the following components:   BUN 25 (*)    Hemoglobin 9.9 (*)    HCT 29.0 (*)    All other components within normal limits  PROTIME-INR  APTT  DIFFERENTIAL  RAPID URINE DRUG SCREEN, HOSP PERFORMED  CBG MONITORING, ED    EKG EKG Interpretation  Date/Time:  Thursday September 03 2019 12:42:01 EDT Ventricular Rate:  71 PR Interval:    QRS Duration: 87 QT Interval:  406 QTC Calculation: 442 R Axis:   34 Text Interpretation: Sinus rhythm Consider left atrial enlargement Artifact Abnormal ECG Confirmed by Carmin Muskrat [phone]) on 09/03/2019 2:16:52  PM   Radiology DG Elbow Complete Right  Result Date: 09/03/2019 CLINICAL DATA:  Fall at home this evening landing on right side. Elbow abrasion. EXAM: RIGHT ELBOW - COMPLETE 3+ VIEW COMPARISON:  None. FINDINGS: There is no evidence of fracture, dislocation, or joint effusion. Tiny olecranon spur. There is no other evidence of arthropathy or other focal bone abnormality. Soft tissues are unremarkable. IMPRESSION: No fracture, dislocation, or joint effusion. Electronically Signed   By: Keith Rake M.D.   On: 09/03/2019 01:00   DG  Knee 2 Views Right  Result Date: 09/03/2019 CLINICAL DATA:  Fall at home this evening landing on right side. Right knee pain. EXAM: RIGHT KNEE - 1-2 VIEW COMPARISON:  None. FINDINGS: No acute fracture or dislocation. Minor osteoarthritis for age. Trace joint effusion. There is a small quadriceps and patellar tendon enthesophyte. Vascular calcifications. IMPRESSION: No acute fracture or dislocation. Electronically Signed   By: Keith Rake M.D.   On: 09/03/2019 01:04   CT HEAD WO CONTRAST  Result Date: 09/03/2019 CLINICAL DATA:  Fall EXAM: CT HEAD WITHOUT CONTRAST TECHNIQUE: Contiguous axial images were obtained from the base of the skull through the vertex without intravenous contrast. COMPARISON:  None. FINDINGS: Brain: There is no mass, hemorrhage or extra-axial collection. The size and configuration of the ventricles and extra-axial CSF spaces are normal. There is hypoattenuation of the white matter, most commonly indicating chronic small vessel disease. Vascular: No abnormal hyperdensity of the major intracranial arteries or dural venous sinuses. No intracranial atherosclerosis. Skull: The visualized skull base, calvarium and extracranial soft tissues are normal. Sinuses/Orbits: No fluid levels or advanced mucosal thickening of the visualized paranasal sinuses. No mastoid or middle ear effusion. The orbits are normal. IMPRESSION: Chronic small vessel disease without  acute intracranial abnormality. Electronically Signed   By: Ulyses Jarred M.D.   On: 09/03/2019 00:46   MR BRAIN WO CONTRAST  Result Date: 09/03/2019 CLINICAL DATA:  Generalized weakness EXAM: MRI HEAD WITHOUT CONTRAST TECHNIQUE: Multiplanar, multiecho pulse sequences of the brain and surrounding structures were obtained without intravenous contrast. COMPARISON:  None. FINDINGS: Brain: There is no acute infarction or intracranial hemorrhage. There is no intracranial mass, mass effect, or edema. There is no hydrocephalus or extra-axial fluid collection. Prominence of the ventricles and sulci reflects generalized parenchymal volume loss. Patchy and confluent areas of T2 hyperintensity in the supratentorial white are nonspecific but may reflect mild to moderate microvascular ischemic changes. Vascular: Major vessel flow voids at the skull base are preserved. Skull and upper cervical spine: Normal marrow signal is preserved. Sinuses/Orbits: Mild mucosal thickening. Bilateral lens replacements. Other: Sella is unremarkable.  Mastoid air cells are clear. IMPRESSION: No evidence of recent infarction, intracranial hemorrhage, or mass. Mild to moderate microvascular ischemic changes. Electronically Signed   By: Macy Mis M.D.   On: 09/03/2019 14:36   DG Hip Unilat W or Wo Pelvis 2-3 Views Right  Result Date: 09/03/2019 CLINICAL DATA:  Fall at home this evening landing on right side. EXAM: DG HIP (WITH OR WITHOUT PELVIS) 2-3V RIGHT COMPARISON:  None. FINDINGS: The cortical margins of the bony pelvis and right hip are intact. No fracture. Pubic rami are intact. Pubic symphysis and sacroiliac joints are congruent. Both femoral heads are well-seated in the respective acetabula. IMPRESSION: No evidence of pelvic or right hip fracture. Electronically Signed   By: Keith Rake M.D.   On: 09/03/2019 01:02    Procedures Procedures (including critical care time)  Medications Ordered in ED Medications  sodium  chloride 0.9 % bolus 500 mL (0 mLs Intravenous Stopped 09/03/19 1509)    ED Course  I have reviewed the triage vital signs and the nursing notes.  Pertinent labs & imaging results that were available during my care of the patient were reviewed by me and considered in my medical decision making (see chart for details).    MDM Rules/Calculators/A&P  Patient is awaiting SNF placement at this time.  She appears well no changes overnight.  PT eval significant for severe neck weakness requiring that she is not discharged straight home at this time.  Discussed this with patient she is agreeable to SNF placement.  Friend at bedside also aware of plan.  Final Clinical Impression(s) / ED Diagnoses Final diagnoses:  Weakness    Rx / DC Orders ED Discharge Orders    None       Tedd Sias, [place] 09/04/19 [place], DO 09/04/19 1232

## 2019-09-04 NOTE — Progress Notes (Addendum)
CSW notified admissions staff at [facility] that the patient has accepted the bed offer. CSW [employer] authorization from [employer].  Madilyn Fireman, MSW, LCSW-A Transitions of Care   Clinical Social Worker  [facility] Emergency Departments   Medical ICU [phone]

## 2019-09-04 NOTE — ED Notes (Signed)
Lunch Tray Ordered @ 1048. °

## 2019-09-04 NOTE — ED Notes (Signed)
Dinner Tray Ordered @ 1817. 

## 2019-09-04 NOTE — TOC Initial Note (Signed)
Transition of Care [facility]) - Initial/Assessment Note    Patient Details  Name: Sylvia Petersen MRN: [id number] Date of Birth: [birth year]  Transition of Care [facility]) CM/SW Contact:    Archie Endo, LCSW Phone Number: 09/04/2019, 12:15 PM  Clinical Narrative:                 CSW met with patient and her neighbor Sylvia Petersen at bedside to complete assessment. Patient is agreeable to short term rehab. Patient and Sylvia Petersen state their first preference is [facility] but will consider other bed offers from SNF's in [place].  CSW will complete FL2 and fax the patient's clinical information out for review. After bed offers are obtained and a facility is selected, insurance auth from [employer] will be required prior to admission into a facility.  Expected Discharge Plan: Skilled Nursing Facility Barriers to Discharge: SNF Pending bed offer, ED SNF auth   Patient Goals and CMS Choice Patient states their goals for this hospitalization and ongoing recovery are:: Get my muscles stronger      Expected Discharge Plan and Services Expected Discharge Plan: [place]                                              Prior Living Arrangements/Services   Lives with:: Self Patient language and need for interpreter reviewed:: Yes Do you feel safe going back to the place where you live?: Yes      Need for Family Participation in Patient Care: Yes (Comment) Care giver support system in place?: Yes (comment)   Criminal Activity/Legal Involvement Pertinent to Current Situation/Hospitalization: No - Comment as needed  Activities of Daily Living      Permission Sought/Granted   Permission granted to share information with : Yes, Verbal Permission Granted        Permission granted to share info w Relationship: Children Legrand Como and [name], Hinda Glatter     Emotional Assessment   Attitude/Demeanor/Rapport: Engaged Affect (typically observed): Accepting Orientation: : Oriented  to Self, Oriented to Place, Oriented to  Time, Oriented to Situation Alcohol / Substance Use: Not Applicable Psych Involvement: No (comment)  Admission diagnosis:  Weakness Patient Active Problem List   Diagnosis Date Noted  . Postoperative state 04/25/2016   PCP:  Angel Weedon Ada, MD Pharmacy:   [facility] [place] [address] Phone: [phone] Fax: [phone]     Social Determinants of Health (SDOH) Interventions    Readmission Risk Interventions No flowsheet data found.

## 2019-09-04 NOTE — ED Notes (Signed)
RN got walker and attempted to get her to stand and walk. She was too weak to stand. RN helped her stand and she held on to walker, took 2 small shuffles and fell back to bed. RN advised SW that pt will not be able to be discharged home safely and PT to come to evaluate.

## 2019-09-04 NOTE — ED Notes (Signed)
Pt asked what she will do when has to go bathroom. Rn asked if she walked with walker at home. She states she has one but does not use it. Rn asked her what would happen if she decided to use it. She said "I wouldn't end up here." Rn explained that

## 2019-09-05 DIAGNOSIS — G478 Other sleep disorders: Secondary | ICD-10-CM | POA: Diagnosis not present

## 2019-09-05 DIAGNOSIS — D649 Anemia, unspecified: Secondary | ICD-10-CM | POA: Diagnosis not present

## 2019-09-05 DIAGNOSIS — R2681 Unsteadiness on feet: Secondary | ICD-10-CM | POA: Diagnosis not present

## 2019-09-05 DIAGNOSIS — R1312 Dysphagia, oropharyngeal phase: Secondary | ICD-10-CM | POA: Diagnosis not present

## 2019-09-05 DIAGNOSIS — R279 Unspecified lack of coordination: Secondary | ICD-10-CM | POA: Diagnosis not present

## 2019-09-05 DIAGNOSIS — Z743 Need for continuous supervision: Secondary | ICD-10-CM | POA: Diagnosis not present

## 2019-09-05 DIAGNOSIS — D6489 Other specified anemias: Secondary | ICD-10-CM | POA: Diagnosis not present

## 2019-09-05 DIAGNOSIS — R2689 Other abnormalities of gait and mobility: Secondary | ICD-10-CM | POA: Diagnosis not present

## 2019-09-05 DIAGNOSIS — M6281 Muscle weakness (generalized): Secondary | ICD-10-CM | POA: Diagnosis not present

## 2019-09-05 DIAGNOSIS — R5381 Other malaise: Secondary | ICD-10-CM | POA: Diagnosis not present

## 2019-09-05 DIAGNOSIS — R4182 Altered mental status, unspecified: Secondary | ICD-10-CM | POA: Diagnosis not present

## 2019-09-05 DIAGNOSIS — R41841 Cognitive communication deficit: Secondary | ICD-10-CM | POA: Diagnosis not present

## 2019-09-05 DIAGNOSIS — I6789 Other cerebrovascular disease: Secondary | ICD-10-CM | POA: Diagnosis not present

## 2019-09-05 DIAGNOSIS — R4702 Dysphasia: Secondary | ICD-10-CM | POA: Diagnosis not present

## 2019-09-05 DIAGNOSIS — E785 Hyperlipidemia, unspecified: Secondary | ICD-10-CM | POA: Diagnosis not present

## 2019-09-05 DIAGNOSIS — I10 Essential (primary) hypertension: Secondary | ICD-10-CM | POA: Diagnosis not present

## 2019-09-05 DIAGNOSIS — R531 Weakness: Secondary | ICD-10-CM | POA: Diagnosis not present

## 2019-09-05 DIAGNOSIS — I959 Hypotension, unspecified: Secondary | ICD-10-CM | POA: Diagnosis not present

## 2019-09-05 DIAGNOSIS — E7849 Other hyperlipidemia: Secondary | ICD-10-CM | POA: Diagnosis not present

## 2019-09-05 DIAGNOSIS — N3941 Urge incontinence: Secondary | ICD-10-CM | POA: Diagnosis not present

## 2019-09-05 DIAGNOSIS — R269 Unspecified abnormalities of gait and mobility: Secondary | ICD-10-CM | POA: Diagnosis not present

## 2019-09-05 DIAGNOSIS — R0989 Other specified symptoms and signs involving the circulatory and respiratory systems: Secondary | ICD-10-CM | POA: Diagnosis not present

## 2019-09-05 DIAGNOSIS — M6259 Muscle wasting and atrophy, not elsewhere classified, multiple sites: Secondary | ICD-10-CM | POA: Diagnosis not present

## 2019-09-05 LAB — SARS CORONAVIRUS 2 BY RT PCR (HOSPITAL ORDER, PERFORMED IN ~~LOC~~ HOSPITAL LAB): SARS Coronavirus 2: NEGATIVE

## 2019-09-05 MED ORDER — AMLODIPINE BESYLATE 5 MG PO TABS
2.5000 mg | ORAL_TABLET | Freq: Every day | ORAL | Status: DC
  Filled 2019-09-05: qty 1

## 2019-09-05 MED ORDER — LISINOPRIL 20 MG PO TABS: 20.0000 mg | ORAL_TABLET | Freq: Every day | ORAL | Status: DC

## 2019-09-05 MED ORDER — ADULT MULTIVITAMIN W/MINERALS CH
1.0000 | ORAL_TABLET | Freq: Every day | ORAL | Status: DC
  Administered 2019-09-05: 1 via ORAL
  Filled 2019-09-05: qty 1

## 2019-09-05 MED ORDER — PRAVASTATIN SODIUM 10 MG PO TABS: 10.0000 mg | ORAL_TABLET | Freq: Every day | ORAL | Status: DC

## 2019-09-05 MED ORDER — METOPROLOL TARTRATE 25 MG PO TABS
50.0000 mg | ORAL_TABLET | Freq: Two times a day (BID) | ORAL | Status: DC
  Filled 2019-09-05: qty 2

## 2019-09-05 NOTE — ED Notes (Signed)
Called ptar for pt transport to [facility]

## 2019-09-05 NOTE — ED Provider Notes (Signed)
RN reports pt does not have order for home meds.  Pt suppose to go to a facility today.  Pt awake. Medications ordered    Fransico Meadow, PA-C 09/05/19 1026    Pattricia Boss, MD 09/07/19 1250

## 2019-09-05 NOTE — TOC Transition Note (Signed)
Transition of Care [facility]) - CM/SW Discharge Note   Patient Details  Name: Sylvia Petersen MRN: [id number] Date of Birth: 1929/10/20  [facility] of [facility]) CM/SW Contact:  Verdell Carmine, RN Phone Number: 09/05/2019, 9:43 AM   Clinical Narrative:     Patient will be going to [facility] place room 1206P. Sheran Luz is aware of room number and number to call report, [phone]  Final next level of care: Skilled Nursing Facility Barriers to Discharge: SNF Pending bed offer, ED SNF auth   Patient Goals and CMS Choice Patient states their goals for this hospitalization and ongoing recovery are:: Get my muscles stronger      Discharge Placement                       Discharge Plan and Services                                     Social Determinants of Health (SDOH) Interventions     Readmission Risk Interventions No flowsheet data found.

## 2019-09-05 NOTE — ED Notes (Signed)
Report called to [facility]  

## 2019-09-05 NOTE — TOC Progression Note (Addendum)
Transition of Care [facility]) - Progression Note    Patient Details  Name: Sylvia Petersen MRN: [id number] Date of Birth: 1929/07/06  Transition of Care [facility]) CM/SW [place], RN Phone Number: 09/05/2019, 8:50 AM  Clinical Narrative:     AVS and face sheet faxed to [facility] [phone]. It does not look like routine medications ordered, MD's in trauma, will inquire about them when able  Expected Discharge Plan: [place] Barriers to Discharge: SNF Pending bed offer, ED SNF auth  Expected Discharge Plan and Services Expected Discharge Plan: [place]                                               Social Determinants of Health (SDOH) Interventions    Readmission Risk Interventions No flowsheet data found.

## 2019-09-05 NOTE — ED Provider Notes (Addendum)
Patient awaiting placement for SNF. Will be going to [facility]. Morning BP meds held as patient is currently normotensive. No complaints at this time.    Renita Papa, PA-C 09/05/19 1152  Addendum: Patient needs COVID test prior to SNF placement.  Will order now.   Debroah Baller 09/05/19 1207    Blanchie Dessert, MD 09/05/19 2004

## 2019-09-05 NOTE — ED Notes (Signed)
Breakfast Ordered 

## 2019-09-05 NOTE — ED Notes (Signed)
Lunch was ordered by Denyse Fillion 

## 2019-09-08 DIAGNOSIS — E785 Hyperlipidemia, unspecified: Secondary | ICD-10-CM | POA: Diagnosis not present

## 2019-09-08 DIAGNOSIS — I10 Essential (primary) hypertension: Secondary | ICD-10-CM | POA: Diagnosis not present

## 2019-09-08 DIAGNOSIS — D649 Anemia, unspecified: Secondary | ICD-10-CM | POA: Diagnosis not present

## 2019-09-08 DIAGNOSIS — R269 Unspecified abnormalities of gait and mobility: Secondary | ICD-10-CM | POA: Diagnosis not present

## 2019-09-09 ENCOUNTER — Encounter (INDEPENDENT_AMBULATORY_CARE_PROVIDER_SITE_OTHER): Payer: PPO | Admitting: Ophthalmology

## 2019-09-15 DIAGNOSIS — R4702 Dysphasia: Secondary | ICD-10-CM | POA: Diagnosis not present

## 2019-09-15 DIAGNOSIS — N3941 Urge incontinence: Secondary | ICD-10-CM | POA: Diagnosis not present

## 2019-09-15 DIAGNOSIS — R5381 Other malaise: Secondary | ICD-10-CM | POA: Diagnosis not present

## 2019-09-15 DIAGNOSIS — R0989 Other specified symptoms and signs involving the circulatory and respiratory systems: Secondary | ICD-10-CM | POA: Diagnosis not present

## 2019-09-22 DIAGNOSIS — I6789 Other cerebrovascular disease: Secondary | ICD-10-CM | POA: Diagnosis not present

## 2019-09-22 DIAGNOSIS — R5381 Other malaise: Secondary | ICD-10-CM | POA: Diagnosis not present

## 2019-09-22 DIAGNOSIS — I10 Essential (primary) hypertension: Secondary | ICD-10-CM | POA: Diagnosis not present

## 2019-09-23 DIAGNOSIS — I10 Essential (primary) hypertension: Secondary | ICD-10-CM | POA: Diagnosis not present

## 2019-09-25 DIAGNOSIS — I10 Essential (primary) hypertension: Secondary | ICD-10-CM | POA: Diagnosis not present

## 2019-09-25 DIAGNOSIS — D6489 Other specified anemias: Secondary | ICD-10-CM | POA: Diagnosis not present

## 2019-09-25 DIAGNOSIS — G478 Other sleep disorders: Secondary | ICD-10-CM | POA: Diagnosis not present

## 2019-09-25 DIAGNOSIS — R2689 Other abnormalities of gait and mobility: Secondary | ICD-10-CM | POA: Diagnosis not present

## 2019-09-25 DIAGNOSIS — I6789 Other cerebrovascular disease: Secondary | ICD-10-CM | POA: Diagnosis not present

## 2019-09-25 DIAGNOSIS — E7849 Other hyperlipidemia: Secondary | ICD-10-CM | POA: Diagnosis not present

## 2019-10-01 DIAGNOSIS — I10 Essential (primary) hypertension: Secondary | ICD-10-CM | POA: Diagnosis not present

## 2019-10-01 DIAGNOSIS — G479 Sleep disorder, unspecified: Secondary | ICD-10-CM | POA: Diagnosis not present

## 2019-10-01 DIAGNOSIS — E785 Hyperlipidemia, unspecified: Secondary | ICD-10-CM | POA: Diagnosis not present

## 2019-10-01 DIAGNOSIS — M160 Bilateral primary osteoarthritis of hip: Secondary | ICD-10-CM | POA: Diagnosis not present

## 2019-10-01 DIAGNOSIS — M170 Bilateral primary osteoarthritis of knee: Secondary | ICD-10-CM | POA: Diagnosis not present

## 2019-10-01 DIAGNOSIS — R634 Abnormal weight loss: Secondary | ICD-10-CM | POA: Diagnosis not present

## 2019-10-01 DIAGNOSIS — Z9181 History of falling: Secondary | ICD-10-CM | POA: Diagnosis not present

## 2019-10-01 DIAGNOSIS — N3941 Urge incontinence: Secondary | ICD-10-CM | POA: Diagnosis not present

## 2019-10-01 DIAGNOSIS — M40209 Unspecified kyphosis, site unspecified: Secondary | ICD-10-CM | POA: Diagnosis not present

## 2019-10-01 DIAGNOSIS — E46 Unspecified protein-calorie malnutrition: Secondary | ICD-10-CM | POA: Diagnosis not present

## 2019-10-01 DIAGNOSIS — I739 Peripheral vascular disease, unspecified: Secondary | ICD-10-CM | POA: Diagnosis not present

## 2019-10-01 DIAGNOSIS — I679 Cerebrovascular disease, unspecified: Secondary | ICD-10-CM | POA: Diagnosis not present

## 2019-10-01 DIAGNOSIS — D649 Anemia, unspecified: Secondary | ICD-10-CM | POA: Diagnosis not present

## 2019-10-06 DIAGNOSIS — E7800 Pure hypercholesterolemia, unspecified: Secondary | ICD-10-CM | POA: Diagnosis not present

## 2019-10-06 DIAGNOSIS — Z9181 History of falling: Secondary | ICD-10-CM | POA: Diagnosis not present

## 2019-10-06 DIAGNOSIS — I10 Essential (primary) hypertension: Secondary | ICD-10-CM | POA: Diagnosis not present

## 2019-10-12 DIAGNOSIS — E46 Unspecified protein-calorie malnutrition: Secondary | ICD-10-CM | POA: Diagnosis not present

## 2019-10-12 DIAGNOSIS — M170 Bilateral primary osteoarthritis of knee: Secondary | ICD-10-CM | POA: Diagnosis not present

## 2019-10-12 DIAGNOSIS — N3941 Urge incontinence: Secondary | ICD-10-CM | POA: Diagnosis not present

## 2019-10-12 DIAGNOSIS — M40209 Unspecified kyphosis, site unspecified: Secondary | ICD-10-CM | POA: Diagnosis not present

## 2019-10-12 DIAGNOSIS — I739 Peripheral vascular disease, unspecified: Secondary | ICD-10-CM | POA: Diagnosis not present

## 2019-10-12 DIAGNOSIS — M160 Bilateral primary osteoarthritis of hip: Secondary | ICD-10-CM | POA: Diagnosis not present

## 2019-10-12 DIAGNOSIS — I10 Essential (primary) hypertension: Secondary | ICD-10-CM | POA: Diagnosis not present

## 2019-10-12 DIAGNOSIS — E785 Hyperlipidemia, unspecified: Secondary | ICD-10-CM | POA: Diagnosis not present

## 2019-10-12 DIAGNOSIS — I679 Cerebrovascular disease, unspecified: Secondary | ICD-10-CM | POA: Diagnosis not present

## 2019-10-12 DIAGNOSIS — R634 Abnormal weight loss: Secondary | ICD-10-CM | POA: Diagnosis not present

## 2019-10-12 DIAGNOSIS — D649 Anemia, unspecified: Secondary | ICD-10-CM | POA: Diagnosis not present

## 2019-10-12 DIAGNOSIS — Z9181 History of falling: Secondary | ICD-10-CM | POA: Diagnosis not present

## 2019-10-12 DIAGNOSIS — G479 Sleep disorder, unspecified: Secondary | ICD-10-CM | POA: Diagnosis not present

## 2019-12-09 DIAGNOSIS — I10 Essential (primary) hypertension: Secondary | ICD-10-CM | POA: Diagnosis not present

## 2019-12-09 DIAGNOSIS — M8580 Other specified disorders of bone density and structure, unspecified site: Secondary | ICD-10-CM | POA: Diagnosis not present

## 2019-12-09 DIAGNOSIS — E7800 Pure hypercholesterolemia, unspecified: Secondary | ICD-10-CM | POA: Diagnosis not present

## 2020-02-02 DIAGNOSIS — R634 Abnormal weight loss: Secondary | ICD-10-CM | POA: Diagnosis not present

## 2020-02-02 DIAGNOSIS — R531 Weakness: Secondary | ICD-10-CM | POA: Diagnosis not present

## 2020-02-02 DIAGNOSIS — R011 Cardiac murmur, unspecified: Secondary | ICD-10-CM | POA: Diagnosis not present

## 2020-02-02 DIAGNOSIS — R2689 Other abnormalities of gait and mobility: Secondary | ICD-10-CM | POA: Diagnosis not present

## 2020-02-02 DIAGNOSIS — N3281 Overactive bladder: Secondary | ICD-10-CM | POA: Diagnosis not present

## 2020-02-02 DIAGNOSIS — R600 Localized edema: Secondary | ICD-10-CM | POA: Diagnosis not present

## 2020-02-09 DIAGNOSIS — I10 Essential (primary) hypertension: Secondary | ICD-10-CM | POA: Diagnosis not present

## 2020-02-09 DIAGNOSIS — E7800 Pure hypercholesterolemia, unspecified: Secondary | ICD-10-CM | POA: Diagnosis not present

## 2020-02-09 DIAGNOSIS — M8580 Other specified disorders of bone density and structure, unspecified site: Secondary | ICD-10-CM | POA: Diagnosis not present

## 2020-02-11 ENCOUNTER — Other Ambulatory Visit: Payer: Self-pay

## 2020-02-11 ENCOUNTER — Inpatient Hospital Stay (HOSPITAL_COMMUNITY)
Admission: EM | Admit: 2020-02-11 | Discharge: 2020-02-23 | DRG: 871 | Disposition: A | Discharge status: Home Health | Payer: PPO | Attending: Internal Medicine | Admitting: Internal Medicine

## 2020-02-11 ENCOUNTER — Emergency Department (HOSPITAL_COMMUNITY): Payer: PPO

## 2020-02-11 ENCOUNTER — Encounter (HOSPITAL_COMMUNITY): Payer: Self-pay

## 2020-02-11 DIAGNOSIS — E860 Dehydration: Secondary | ICD-10-CM | POA: Diagnosis not present

## 2020-02-11 DIAGNOSIS — A4189 Other specified sepsis: Secondary | ICD-10-CM | POA: Diagnosis not present

## 2020-02-11 DIAGNOSIS — E785 Hyperlipidemia, unspecified: Secondary | ICD-10-CM | POA: Diagnosis present

## 2020-02-11 DIAGNOSIS — R7989 Other specified abnormal findings of blood chemistry: Secondary | ICD-10-CM | POA: Diagnosis present

## 2020-02-11 DIAGNOSIS — U071 COVID-19: Secondary | ICD-10-CM | POA: Diagnosis present

## 2020-02-11 DIAGNOSIS — A4151 Sepsis due to Escherichia coli [E. coli]: Secondary | ICD-10-CM | POA: Diagnosis not present

## 2020-02-11 DIAGNOSIS — Z7401 Bed confinement status: Secondary | ICD-10-CM | POA: Diagnosis not present

## 2020-02-11 DIAGNOSIS — I10 Essential (primary) hypertension: Secondary | ICD-10-CM | POA: Diagnosis present

## 2020-02-11 DIAGNOSIS — M6282 Rhabdomyolysis: Secondary | ICD-10-CM | POA: Diagnosis present

## 2020-02-11 DIAGNOSIS — Z66 Do not resuscitate: Secondary | ICD-10-CM | POA: Diagnosis present

## 2020-02-11 DIAGNOSIS — R54 Age-related physical debility: Secondary | ICD-10-CM | POA: Diagnosis not present

## 2020-02-11 DIAGNOSIS — R52 Pain, unspecified: Secondary | ICD-10-CM | POA: Diagnosis not present

## 2020-02-11 DIAGNOSIS — R6510 Systemic inflammatory response syndrome (SIRS) of non-infectious origin without acute organ dysfunction: Secondary | ICD-10-CM | POA: Diagnosis not present

## 2020-02-11 DIAGNOSIS — Z79899 Other long term (current) drug therapy: Secondary | ICD-10-CM | POA: Diagnosis not present

## 2020-02-11 DIAGNOSIS — I160 Hypertensive urgency: Secondary | ICD-10-CM | POA: Diagnosis not present

## 2020-02-11 DIAGNOSIS — M2550 Pain in unspecified joint: Secondary | ICD-10-CM | POA: Diagnosis not present

## 2020-02-11 DIAGNOSIS — R7401 Elevation of levels of liver transaminase levels: Secondary | ICD-10-CM | POA: Diagnosis not present

## 2020-02-11 DIAGNOSIS — R0689 Other abnormalities of breathing: Secondary | ICD-10-CM | POA: Diagnosis not present

## 2020-02-11 DIAGNOSIS — R531 Weakness: Secondary | ICD-10-CM | POA: Diagnosis not present

## 2020-02-11 DIAGNOSIS — R000 Tachycardia, unspecified: Secondary | ICD-10-CM | POA: Diagnosis not present

## 2020-02-11 DIAGNOSIS — R0602 Shortness of breath: Secondary | ICD-10-CM | POA: Diagnosis not present

## 2020-02-11 DIAGNOSIS — R0902 Hypoxemia: Secondary | ICD-10-CM | POA: Diagnosis not present

## 2020-02-11 DIAGNOSIS — M7989 Other specified soft tissue disorders: Secondary | ICD-10-CM | POA: Diagnosis not present

## 2020-02-11 DIAGNOSIS — N390 Urinary tract infection, site not specified: Secondary | ICD-10-CM | POA: Diagnosis present

## 2020-02-11 DIAGNOSIS — Z90710 Acquired absence of both cervix and uterus: Secondary | ICD-10-CM

## 2020-02-11 DIAGNOSIS — E7800 Pure hypercholesterolemia, unspecified: Secondary | ICD-10-CM | POA: Diagnosis present

## 2020-02-11 DIAGNOSIS — Z23 Encounter for immunization: Secondary | ICD-10-CM | POA: Diagnosis present

## 2020-02-11 DIAGNOSIS — E870 Hyperosmolality and hypernatremia: Secondary | ICD-10-CM | POA: Diagnosis not present

## 2020-02-11 DIAGNOSIS — R39198 Other difficulties with micturition: Secondary | ICD-10-CM | POA: Diagnosis not present

## 2020-02-11 DIAGNOSIS — W19XXXA Unspecified fall, initial encounter: Secondary | ICD-10-CM

## 2020-02-11 DIAGNOSIS — N179 Acute kidney failure, unspecified: Secondary | ICD-10-CM | POA: Diagnosis not present

## 2020-02-11 DIAGNOSIS — R5381 Other malaise: Secondary | ICD-10-CM | POA: Diagnosis not present

## 2020-02-11 LAB — CBC
HCT: 37 % (ref 36.0–46.0)
Hemoglobin: 11.9 g/dL — ABNORMAL LOW (ref 12.0–15.0)
MCH: 28.9 pg (ref 26.0–34.0)
MCHC: 32.2 g/dL (ref 30.0–36.0)
MCV: 89.8 fL (ref 80.0–100.0)
Platelets: 226 10*3/uL (ref 150–400)
RBC: 4.12 MIL/uL (ref 3.87–5.11)
RDW: 13.3 % (ref 11.5–15.5)
WBC: 8.2 10*3/uL (ref 4.0–10.5)
nRBC: 0 % (ref 0.0–0.2)

## 2020-02-11 LAB — URINALYSIS, ROUTINE W REFLEX MICROSCOPIC
Bilirubin Urine: NEGATIVE
Glucose, UA: NEGATIVE mg/dL
Ketones, ur: 5 mg/dL — AB
Nitrite: POSITIVE — AB
Protein, ur: 30 mg/dL — AB
Specific Gravity, Urine: 1.018 (ref 1.005–1.030)
WBC, UA: >50 WBC/hpf — ABNORMAL HIGH (ref 0–5)
pH: 6 (ref 5.0–8.0)

## 2020-02-11 LAB — COMPREHENSIVE METABOLIC PANEL
ALT: 37 U/L (ref 0–44)
AST: 58 U/L — ABNORMAL HIGH (ref 15–41)
Albumin: 3.3 g/dL — ABNORMAL LOW (ref 3.5–5.0)
Alkaline Phosphatase: 50 U/L (ref 38–126)
Anion gap: 13 (ref 5–15)
BUN: 23 mg/dL (ref 8–23)
CO2: 24 mmol/L (ref 22–32)
Calcium: 9.5 mg/dL (ref 8.9–10.3)
Chloride: 101 mmol/L (ref 98–111)
Creatinine, Ser: 1.14 mg/dL — ABNORMAL HIGH (ref 0.44–1.00)
GFR, Estimated: 46 mL/min — ABNORMAL LOW (ref >60)
Glucose, Bld: 113 mg/dL — ABNORMAL HIGH (ref 70–99)
Potassium: 3.8 mmol/L (ref 3.5–5.1)
Sodium: 138 mmol/L (ref 135–145)
Total Bilirubin: 0.6 mg/dL (ref 0.3–1.2)
Total Protein: 7 g/dL (ref 6.5–8.1)

## 2020-02-11 LAB — CBC WITH DIFFERENTIAL/PLATELET
Abs Immature Granulocytes: 0.03 10*3/uL (ref 0.00–0.07)
Basophils Absolute: 0 10*3/uL (ref 0.0–0.1)
Basophils Relative: 0 %
Eosinophils Absolute: 0 10*3/uL (ref 0.0–0.5)
Eosinophils Relative: 0 %
HCT: 36 % (ref 36.0–46.0)
Hemoglobin: 11.6 g/dL — ABNORMAL LOW (ref 12.0–15.0)
Immature Granulocytes: 0 %
Lymphocytes Relative: 6 %
Lymphs Abs: 0.6 10*3/uL — ABNORMAL LOW (ref 0.7–4.0)
MCH: 29.1 pg (ref 26.0–34.0)
MCHC: 32.2 g/dL (ref 30.0–36.0)
MCV: 90.5 fL (ref 80.0–100.0)
Monocytes Absolute: 0.4 10*3/uL (ref 0.1–1.0)
Monocytes Relative: 5 %
Neutro Abs: 7.7 10*3/uL (ref 1.7–7.7)
Neutrophils Relative %: 89 %
Platelets: 250 10*3/uL (ref 150–400)
RBC: 3.98 MIL/uL (ref 3.87–5.11)
RDW: 13.4 % (ref 11.5–15.5)
WBC: 8.7 10*3/uL (ref 4.0–10.5)
nRBC: 0 % (ref 0.0–0.2)

## 2020-02-11 LAB — LACTIC ACID, PLASMA
Lactic Acid, Venous: 1.3 mmol/L (ref 0.5–1.9)
Lactic Acid, Venous: 1.6 mmol/L (ref 0.5–1.9)

## 2020-02-11 LAB — RESP PANEL BY RT-PCR (FLU A&B, COVID) ARPGX2
Influenza A by PCR: NEGATIVE
Influenza B by PCR: NEGATIVE
SARS Coronavirus 2 by RT PCR: POSITIVE — AB

## 2020-02-11 LAB — D-DIMER, QUANTITATIVE: D-Dimer, Quant: 1.82 ug/mL-FEU — ABNORMAL HIGH (ref 0.00–0.50)

## 2020-02-11 LAB — TSH: TSH: 0.783 u[IU]/mL (ref 0.350–4.500)

## 2020-02-11 LAB — CREATININE, SERUM
Creatinine, Ser: 0.88 mg/dL (ref 0.44–1.00)
GFR, Estimated: >60 mL/min (ref >60)

## 2020-02-11 LAB — C-REACTIVE PROTEIN: CRP: 10.3 mg/dL — ABNORMAL HIGH (ref <1.0)

## 2020-02-11 MED ORDER — LACTATED RINGERS IV BOLUS
500.0000 mL | Freq: Once | INTRAVENOUS | Status: AC
  Administered 2020-02-11: 500 mL via INTRAVENOUS

## 2020-02-11 MED ORDER — AMLODIPINE BESYLATE 2.5 MG PO TABS
2.5000 mg | ORAL_TABLET | Freq: Every day | ORAL | Status: DC
  Administered 2020-02-12 – 2020-02-13 (×2): 2.5 mg via ORAL
  Filled 2020-02-11 (×2): qty 1

## 2020-02-11 MED ORDER — HYDRALAZINE HCL 20 MG/ML IJ SOLN
10.0000 mg | INTRAMUSCULAR | Status: DC | PRN
  Administered 2020-02-11 – 2020-02-12 (×3): 10 mg via INTRAVENOUS
  Filled 2020-02-11 (×3): qty 1

## 2020-02-11 MED ORDER — SODIUM CHLORIDE 0.9 % IV SOLN: INTRAVENOUS | Status: AC

## 2020-02-11 MED ORDER — METOPROLOL TARTRATE 50 MG PO TABS
50.0000 mg | ORAL_TABLET | Freq: Two times a day (BID) | ORAL | Status: DC
  Administered 2020-02-11 – 2020-02-22 (×23): 50 mg via ORAL
  Filled 2020-02-11 (×6): qty 1
  Filled 2020-02-11: qty 2
  Filled 2020-02-11 (×9): qty 1
  Filled 2020-02-11: qty 2
  Filled 2020-02-11 (×7): qty 1

## 2020-02-11 MED ORDER — ENOXAPARIN SODIUM 30 MG/0.3ML ~~LOC~~ SOLN
30.0000 mg | SUBCUTANEOUS | Status: DC
  Administered 2020-02-12 – 2020-02-13 (×2): 30 mg via SUBCUTANEOUS
  Filled 2020-02-11: qty 0.3

## 2020-02-11 MED ORDER — SODIUM CHLORIDE 0.9 % IV SOLN
1.0000 g | Freq: Once | INTRAVENOUS | Status: AC
  Administered 2020-02-11: 1 g via INTRAVENOUS
  Filled 2020-02-11: qty 10

## 2020-02-11 MED ORDER — ZINC SULFATE 220 (50 ZN) MG PO CAPS
220.0000 mg | ORAL_CAPSULE | Freq: Every day | ORAL | Status: DC
  Administered 2020-02-12 – 2020-02-23 (×12): 220 mg via ORAL
  Filled 2020-02-11 (×12): qty 1

## 2020-02-11 MED ORDER — ASCORBIC ACID 500 MG PO TABS
500.0000 mg | ORAL_TABLET | Freq: Every day | ORAL | Status: DC
  Administered 2020-02-12 – 2020-02-23 (×12): 500 mg via ORAL
  Filled 2020-02-11 (×12): qty 1

## 2020-02-11 MED ORDER — PRAVASTATIN SODIUM 10 MG PO TABS
20.0000 mg | ORAL_TABLET | Freq: Every day | ORAL | Status: DC
  Administered 2020-02-12 – 2020-02-13 (×2): 20 mg via ORAL
  Filled 2020-02-11 (×2): qty 2

## 2020-02-11 MED ORDER — SODIUM CHLORIDE 0.9 % IV SOLN
1.0000 g | INTRAVENOUS | Status: DC
  Administered 2020-02-12: 1 g via INTRAVENOUS
  Filled 2020-02-11: qty 10

## 2020-02-11 MED ORDER — ACETAMINOPHEN 325 MG PO TABS
650.0000 mg | ORAL_TABLET | Freq: Four times a day (QID) | ORAL | Status: DC | PRN
  Administered 2020-02-11 – 2020-02-21 (×6): 650 mg via ORAL
  Filled 2020-02-11 (×6): qty 2

## 2020-02-11 NOTE — ED Notes (Signed)
Pt's son has been updated on pt's status and plan of care. Call son for any updates Lidwina Kaner [phone].

## 2020-02-11 NOTE — ED Notes (Signed)
Spoke to Dr. [name] informed SBP >190. New orders pending.

## 2020-02-11 NOTE — H&P (Signed)
History and Physical    Sylvia Petersen WCB:[id number] DOB: August 17, 1929 DOA: 02/11/2020  PCP: [name], MD  Patient coming from: Home.  Chief Complaint: Weakness.  HPI: Sylvia Petersen is a 86 y.o. female with history of hypertension and hyperlipidemia has been feeling weak for the last 1 week. Patient states about 4 days ago patient had a fall and had to lay on the floor for almost 12 hours and her neighbor helped her back to the bed. Since then she has been bedbound and her family called EMS and was brought to the ER. Patient states she is feeling so weak that she not able to ambulate usually able to ambulate without any help. Denies chest pain nausea vomiting diarrhea shortness of breath or any dysuria.  ED Course: In the ER patient was febrile tachycardic fever of 103 F tachypneic with labs showing mildly elevated creatinine from baseline of 1.1 and hemoglobin is around 11. Chest x-ray was unremarkable EKG shows normal sinus rhythm. UA is consistent with UTI. Blood cultures were obtained and started on empiric antibiotics and eventually patient's Covid test came back positive. Patient admitted for further work-up for SIRS likely from UTI and Covid.  Review of Systems: As per HPI, rest all negative.   Past Medical History:  Diagnosis Date  . Arthritis   . High cholesterol   . Hypertension     Past Surgical History:  Procedure Laterality Date  . ABDOMINAL HYSTERECTOMY    . BLADDER SUSPENSION N/A 04/25/2016   Procedure: TRANSVAGINAL TAPE (TVT) PROCEDURE;  Surgeon: Bobbye Charleston, MD;  Location: [facility];  Service: Gynecology;  Laterality: N/A;  . CARDIAC CATHETERIZATION    . CYSTOCELE REPAIR N/A 04/25/2016   Procedure: ANTERIOR REPAIR (CYSTOCELE);  Surgeon: Bobbye Charleston, MD;  Location: [facility];  Service: Gynecology;  Laterality: N/A;  . CYSTOSCOPY N/A 04/25/2016   Procedure: CYSTOSCOPY;  Surgeon: Bobbye Charleston, MD;  Location: [facility];  Service: Gynecology;  Laterality:  N/A;  . EYE SURGERY       reports that she has never smoked. She has never used smokeless tobacco. She reports that she does not drink alcohol and does not use drugs.  No Known Allergies  Family History  Problem Relation Age of Onset  . Cancer Sister     Prior to Admission medications   Medication Sig Start Date End Date Taking? Authorizing Provider  acetaminophen (TYLENOL) 500 MG tablet Take 1,000 mg by mouth every 6 (six) hours as needed for mild pain.   Yes [provider]  amLODipine (NORVASC) 2.5 MG tablet Take 2.5 mg by mouth daily.   Yes [provider]  Carboxymethylcellul-Glycerin (CLEAR EYES FOR DRY EYES OP) Apply 1 drop to eye daily as needed (for dry eyes).   Yes [provider]  ibuprofen (ADVIL,MOTRIN) 200 MG tablet Take 400 mg by mouth every 6 (six) hours as needed for mild pain.   Yes [provider]  lisinopril (PRINIVIL,ZESTRIL) 20 MG tablet Take 20 mg by mouth at bedtime.    Yes [provider]  lovastatin (MEVACOR) 20 MG tablet Take 20 mg by mouth daily at 6 PM.    Yes [provider]  metoprolol (LOPRESSOR) 50 MG tablet Take 50 mg by mouth 2 (two) times daily.   Yes [provider]  Multiple Vitamin (MULTIVITAMIN WITH MINERALS) TABS Take 1 tablet by mouth daily.   Yes [provider]    Physical Exam: Constitutional: Moderately built and nourished. Vitals:  02/11/20 2029 02/11/20 2030 02/11/20 2045 02/11/20 2115  BP:  (!) 196/86 (!) 193/83 (!) 196/94  Pulse:  94 95 98  Resp:  20 (!) 21 (!) 25  Temp: (!) 103.2 F (39.6 C)     TempSrc: Rectal     SpO2:  98% 97% 94%  Weight:      Height:       Eyes: Anicteric no pallor. ENMT: No discharge from the ears eyes nose or mouth. Neck: No mass felt. No neck rigidity. Respiratory: No rhonchi or crepitations. Cardiovascular: S1-S2 heard. Abdomen: Soft nontender bowel sounds present. Musculoskeletal: No edema. Skin: No rash. Neurologic:  Alert awake oriented to time place and person. Generally weak. Psychiatric: Appears normal. Normal affect.   Labs on Admission: I have personally reviewed following labs and imaging studies  CBC: Recent Labs  Lab 02/11/20 1552  WBC 8.7  NEUTROABS 7.7  HGB 11.6*  HCT 36.0  MCV 90.5  PLT 814   Basic Metabolic Panel: Recent Labs  Lab 02/11/20 1552  NA 138  K 3.8  CL 101  CO2 24  GLUCOSE 113*  BUN 23  CREATININE 1.14*  CALCIUM 9.5   GFR: Estimated Creatinine Clearance: 27 mL/min (A) (by C-G formula based on SCr of 1.14 mg/dL (H)). Liver Function Tests: Recent Labs  Lab 02/11/20 1552  AST 58*  ALT 37  ALKPHOS 50  BILITOT 0.6  PROT 7.0  ALBUMIN 3.3*   No results for input(s): LIPASE, AMYLASE in the last 168 hours. No results for input(s): AMMONIA in the last 168 hours. Coagulation Profile: No results for input(s): INR, PROTIME in the last 168 hours. Cardiac Enzymes: No results for input(s): CKTOTAL, CKMB, CKMBINDEX, TROPONINI in the last 168 hours. BNP (last 3 results) No results for input(s): PROBNP in the last 8760 hours. HbA1C: No results for input(s): HGBA1C in the last 72 hours. CBG: No results for input(s): GLUCAP in the last 168 hours. Lipid Profile: No results for input(s): CHOL, HDL, LDLCALC, TRIG, CHOLHDL, LDLDIRECT in the last 72 hours. Thyroid Function Tests: Recent Labs    02/11/20 1553  TSH 0.783   Anemia Panel: No results for input(s): VITAMINB12, FOLATE, FERRITIN, TIBC, IRON, RETICCTPCT in the last 72 hours. Urine analysis:    Component Value Date/Time   COLORURINE YELLOW 02/11/2020 1652   APPEARANCEUR HAZY (A) 02/11/2020 1652   LABSPEC 1.018 02/11/2020 1652   PHURINE 6.0 02/11/2020 1652   GLUCOSEU NEGATIVE 02/11/2020 1652   HGBUR SMALL (A) 02/11/2020 1652   BILIRUBINUR NEGATIVE 02/11/2020 1652   KETONESUR 5 (A) 02/11/2020 1652   PROTEINUR 30 (A) 02/11/2020 1652   UROBILINOGEN 0.2 12/27/2011 1127   NITRITE POSITIVE (A) 02/11/2020  1652   LEUKOCYTESUR LARGE (A) 02/11/2020 1652   Sepsis Labs: @LABRCNTIP (procalcitonin:4,lacticidven:4) )No results found for this or any previous visit (from the past 240 hour(s)).   Radiological Exams on Admission: DG Chest Portable 1 View  Result Date: 02/11/2020 CLINICAL DATA:  Weakness. EXAM: PORTABLE CHEST 1 VIEW COMPARISON:  CT chest 12/0 scratch the CT chest 02/19/2019. FINDINGS: The lungs clear. Heart size normal. Aortic atherosclerosis. No pneumothorax or pleural fluid. IMPRESSION: No acute disease. Aortic Atherosclerosis (ICD10-I70.0). Electronically Signed   By: Inge Rise M.D.   On: 02/11/2020 17:46    EKG: Independently reviewed. Normal sinus rhythm.  Assessment/Plan Principal Problem:   Acute lower UTI Active Problems:   ARF (acute renal failure) (HCC)   Hypertensive urgency    1. SIRS likely from UTI and COVID-19 infection. 2.  Acute lower UTI for which patient is started on ceftriaxone gentle hydration follow cultures. 3. COVID-19 infection presently not hypoxic. Chest x-ray does not show infiltrates. Patient is febrile. Could be also from the UTI. We will closely monitor respiratory status and inflammatory markers. 4. Generalized weakness could be from dehydration weakness and Covid infection and possible developing sepsis. Will check CK levels since patient is quite weak will check CT head and C-spine x-ray pelvis. Continue with hydration get physical therapy consult. 5. Hypertensive urgency we will keep patient on as needed IV hydralazine continue amlodipine and patient's beta-blocker. I am holding off patient's lisinopril due to acute renal failure. 6. Acute renal failure likely from dehydration. Gently hydrate hold lisinopril for now and follow metabolic panel. 7. Hyperlipidemia on statin check CK levels.  Given that patient has SIRS which on presentation with UTI and Covid infection and generalized weakness with difficulty ambulating will need inpatient  status.   DVT prophylaxis: Lovenox. Code Status: Full code as confirmed with patient. Family Communication: We will need to discuss with family. Disposition Plan: May need rehab. Consults called: None. Admission status: Inpatient.   Rise Patience MD [facility] Pager [phone].  If 7PM-7AM, please contact night-coverage [email or web address] Password [facility]  02/11/2020, 9:27 PM

## 2020-02-11 NOTE — ED Provider Notes (Signed)
[facility] EMERGENCY DEPARTMENT Provider Note   CSN: [id number] Arrival date & time: 02/11/20  1509     History Chief Complaint  Patient presents with  . weakness    Sylvia Petersen is a 86 y.o. female.  HPI Patient presents with generalized weakness.  Brought in by EMS.  States for the last week she has not been feeling well.  Somewhat decreased oral intake.  States she is being encouraged to force to eat.  Per EMS reportedly had a fever of 103.  200 cc fluid bolus given.  No report of blood pressure from EMS.  Reportedly had heart rate of 90.  Patient states she just feels weak all over.  Denies dysuria however per EMS stated she had burning with urination.  No chest pain.  No trouble breathing.  No cough.  States she did fall again.    Past Medical History:  Diagnosis Date  . Arthritis   . High cholesterol   . Hypertension     Patient Active Problem List   Diagnosis Date Noted  . Postoperative state 04/25/2016    Past Surgical History:  Procedure Laterality Date  . ABDOMINAL HYSTERECTOMY    . BLADDER SUSPENSION N/A 04/25/2016   Procedure: TRANSVAGINAL TAPE (TVT) PROCEDURE;  Surgeon: Bobbye Charleston, MD;  Location: [facility];  Service: Gynecology;  Laterality: N/A;  . CARDIAC CATHETERIZATION    . CYSTOCELE REPAIR N/A 04/25/2016   Procedure: ANTERIOR REPAIR (CYSTOCELE);  Surgeon: Bobbye Charleston, MD;  Location: [facility];  Service: Gynecology;  Laterality: N/A;  . CYSTOSCOPY N/A 04/25/2016   Procedure: CYSTOSCOPY;  Surgeon: Bobbye Charleston, MD;  Location: [facility];  Service: Gynecology;  Laterality: N/A;  . EYE SURGERY       OB History   No obstetric history on file.     Family History  Problem Relation Age of Onset  . Cancer Sister     Social History   Tobacco Use  . Smoking status: Never Smoker  . Smokeless tobacco: Never Used  Substance Use Topics  . Alcohol use: No  . Drug use: No    Home Medications Prior to Admission medications    Medication Sig Start Date End Date Taking? Authorizing Provider  acetaminophen (TYLENOL) 500 MG tablet Take 1,000 mg by mouth every 6 (six) hours as needed for mild pain.    [provider]  amLODipine (NORVASC) 2.5 MG tablet Take 2.5 mg by mouth daily.    [provider]  ibuprofen (ADVIL,MOTRIN) 200 MG tablet Take 400 mg by mouth every 6 (six) hours as needed for mild pain.    [provider]  lisinopril (PRINIVIL,ZESTRIL) 20 MG tablet Take 20 mg by mouth at bedtime.     [provider]  lovastatin (MEVACOR) 20 MG tablet Take 20 mg by mouth daily at 6 PM.     [provider]  metoprolol (LOPRESSOR) 50 MG tablet Take 50 mg by mouth 2 (two) times daily.    [provider]  Multiple Vitamin (MULTIVITAMIN WITH MINERALS) TABS Take 1 tablet by mouth daily.    [provider]    Allergies    Patient has no known allergies.  Review of Systems   Review of Systems  Constitutional: Positive for appetite change and fatigue.  HENT: Negative for congestion.   Respiratory: Negative for shortness of breath.   Cardiovascular: Negative for chest pain.  Gastrointestinal: Negative for abdominal pain.  Genitourinary: Negative for flank pain.  Musculoskeletal: Negative for  back pain.  Skin: Negative for rash.  Neurological: Positive for weakness.  Psychiatric/Behavioral: Negative for confusion.    Physical Exam Updated Vital Signs BP (!) 198/88   Pulse 97   Temp 99.8 F (37.7 C) (Oral)   Resp (!) 22   Ht 5\' 2"  (1.575 m)   Wt 53.1 kg   SpO2 98%   BMI 21.40 kg/m   Physical Exam Vitals and nursing note reviewed.  Constitutional:      Appearance: Normal appearance.  HENT:     Head: Normocephalic.     Mouth/Throat:     Mouth: Mucous membranes are dry.  Eyes:     Pupils: Pupils are equal, round, and reactive to light.  Cardiovascular:     Rate and Rhythm: Regular rhythm.  Pulmonary:     Breath sounds: No wheezing.   Abdominal:     Tenderness: There is no abdominal tenderness.  Musculoskeletal:        General: No tenderness.     Cervical back: Neck supple.  Skin:    General: Skin is warm.     Capillary Refill: Capillary refill takes less than 2 seconds.  Neurological:     Mental Status: She is alert and oriented to person, place, and time.     ED Results / Procedures / Treatments   Labs (all labs ordered are listed, but only abnormal results are displayed) Labs Reviewed  COMPREHENSIVE METABOLIC PANEL - Abnormal; Notable for the following components:      Result Value   Glucose, Bld 113 (*)    Creatinine, Ser 1.14 (*)    Albumin 3.3 (*)    AST 58 (*)    GFR, Estimated 46 (*)    All other components within normal limits  CBC WITH DIFFERENTIAL/PLATELET - Abnormal; Notable for the following components:   Hemoglobin 11.6 (*)    Lymphs Abs 0.6 (*)    All other components within normal limits  URINALYSIS, ROUTINE W REFLEX MICROSCOPIC - Abnormal; Notable for the following components:   APPearance HAZY (*)    Hgb urine dipstick SMALL (*)    Ketones, ur 5 (*)    Protein, ur 30 (*)    Nitrite POSITIVE (*)    Leukocytes,Ua LARGE (*)    WBC, UA >50 (*)    Bacteria, UA FEW (*)    All other components within normal limits  CULTURE, BLOOD (ROUTINE X 2)  CULTURE, BLOOD (ROUTINE X 2)  URINE CULTURE  RESP PANEL BY RT-PCR (FLU A&B, COVID) [id number]  LACTIC ACID, PLASMA  TSH  LACTIC ACID, PLASMA    EKG EKG Interpretation  Date/Time:  Thursday February 11 2020 15:58:10 EST Ventricular Rate:  87 PR Interval:    QRS Duration: 91 QT Interval:  353 QTC Calculation: 425 R Axis:   55 Text Interpretation: Sinus rhythm Baseline wander in lead(s) V6 Confirmed by Davonna Belling [phone]) on 02/11/2020 4:15:44 PM   Radiology DG Chest Portable 1 View  Result Date: 02/11/2020 CLINICAL DATA:  Weakness. EXAM: PORTABLE CHEST 1 VIEW COMPARISON:  CT chest 12/0 scratch the CT chest 02/19/2019. FINDINGS: The  lungs clear. Heart size normal. Aortic atherosclerosis. No pneumothorax or pleural fluid. IMPRESSION: No acute disease. Aortic Atherosclerosis (ICD10-I70.0). Electronically Signed   By: Inge Rise M.D.   On: 02/11/2020 17:46    Procedures Procedures (including critical care time)  Medications Ordered in ED Medications  lactated ringers bolus 500 mL (0 mLs Intravenous Stopped 02/11/20 1741)  cefTRIAXone (ROCEPHIN) 1 g in sodium  chloride 0.9 % 100 mL IVPB (1 g Intravenous New Bag/Given 02/11/20 1901)    ED Course  I have reviewed the triage vital signs and the nursing notes.  Pertinent labs & imaging results that were available during my care of the patient were reviewed by me and considered in my medical decision making (see chart for details).    MDM Rules/Calculators/A&P                          Patient with generalized weakness. Usually ambulatory but has been in bed for the last 3 days. Urine shows likely urinary tract infection. Lab work otherwise overall reassuring. Does have confusion. Not hypotensive. Blood cultures and urine cultures done. Lactic acid normal. Will admit for UTI and dehydration/encephalopathy. Doubt intracranial injury as a cause of the mental status change.  Final Clinical Impression(s) / ED Diagnoses Final diagnoses:  Urinary tract infection without hematuria, site unspecified    Rx / DC Orders ED Discharge Orders    None       Davonna Belling, MD 02/11/20 1939

## 2020-02-11 NOTE — ED Triage Notes (Signed)
Pt arrives via EMS. Pt feeling weak for one week. Hx uti. Pt has strong urine odor and burning with urination. Pt has been bed bound for 3 days- usually ambulates. EMS reports temp 103.3, HR 90, resp 28, O2 94% CBG 122. 233mL NS bolus from EMS. Pt is alert.

## 2020-02-12 ENCOUNTER — Inpatient Hospital Stay (HOSPITAL_COMMUNITY): Payer: PPO

## 2020-02-12 DIAGNOSIS — R6510 Systemic inflammatory response syndrome (SIRS) of non-infectious origin without acute organ dysfunction: Secondary | ICD-10-CM | POA: Diagnosis present

## 2020-02-12 DIAGNOSIS — U071 COVID-19: Secondary | ICD-10-CM

## 2020-02-12 LAB — CBC WITH DIFFERENTIAL/PLATELET
Abs Immature Granulocytes: 0.05 10*3/uL (ref 0.00–0.07)
Basophils Absolute: 0 10*3/uL (ref 0.0–0.1)
Basophils Relative: 0 %
Eosinophils Absolute: 0 10*3/uL (ref 0.0–0.5)
Eosinophils Relative: 0 %
HCT: 37.5 % (ref 36.0–46.0)
Hemoglobin: 12.1 g/dL (ref 12.0–15.0)
Immature Granulocytes: 1 %
Lymphocytes Relative: 5 %
Lymphs Abs: 0.5 10*3/uL — ABNORMAL LOW (ref 0.7–4.0)
MCH: 29.1 pg (ref 26.0–34.0)
MCHC: 32.3 g/dL (ref 30.0–36.0)
MCV: 90.1 fL (ref 80.0–100.0)
Monocytes Absolute: 0.4 10*3/uL (ref 0.1–1.0)
Monocytes Relative: 4 %
Neutro Abs: 8.7 10*3/uL — ABNORMAL HIGH (ref 1.7–7.7)
Neutrophils Relative %: 90 %
Platelets: 245 10*3/uL (ref 150–400)
RBC: 4.16 MIL/uL (ref 3.87–5.11)
RDW: 13.2 % (ref 11.5–15.5)
WBC: 9.6 10*3/uL (ref 4.0–10.5)
nRBC: 0 % (ref 0.0–0.2)

## 2020-02-12 LAB — COMPREHENSIVE METABOLIC PANEL
ALT: 33 U/L (ref 0–44)
AST: 50 U/L — ABNORMAL HIGH (ref 15–41)
Albumin: 2.8 g/dL — ABNORMAL LOW (ref 3.5–5.0)
Alkaline Phosphatase: 49 U/L (ref 38–126)
Anion gap: 13 (ref 5–15)
BUN: 23 mg/dL (ref 8–23)
CO2: 21 mmol/L — ABNORMAL LOW (ref 22–32)
Calcium: 8.6 mg/dL — ABNORMAL LOW (ref 8.9–10.3)
Chloride: 103 mmol/L (ref 98–111)
Creatinine, Ser: 0.98 mg/dL (ref 0.44–1.00)
GFR, Estimated: 56 mL/min — ABNORMAL LOW (ref >60)
Glucose, Bld: 137 mg/dL — ABNORMAL HIGH (ref 70–99)
Potassium: 3.3 mmol/L — ABNORMAL LOW (ref 3.5–5.1)
Sodium: 137 mmol/L (ref 135–145)
Total Bilirubin: 0.5 mg/dL (ref 0.3–1.2)
Total Protein: 5.9 g/dL — ABNORMAL LOW (ref 6.5–8.1)

## 2020-02-12 LAB — D-DIMER, QUANTITATIVE: D-Dimer, Quant: 2.24 ug/mL-FEU — ABNORMAL HIGH (ref 0.00–0.50)

## 2020-02-12 LAB — CK: Total CK: 229 U/L (ref 38–234)

## 2020-02-12 LAB — C-REACTIVE PROTEIN: CRP: 11.5 mg/dL — ABNORMAL HIGH (ref <1.0)

## 2020-02-12 MED ORDER — SODIUM CHLORIDE 0.9 % IV SOLN: INTRAVENOUS | Status: DC | PRN

## 2020-02-12 MED ORDER — SODIUM CHLORIDE 0.9 % IV SOLN
Freq: Once | INTRAVENOUS | Status: AC
  Filled 2020-02-12: qty 20

## 2020-02-12 MED ORDER — POTASSIUM CHLORIDE 20 MEQ PO PACK
40.0000 meq | PACK | Freq: Once | ORAL | Status: AC
  Administered 2020-02-12: 40 meq via ORAL
  Filled 2020-02-12: qty 2

## 2020-02-12 MED ORDER — DIPHENHYDRAMINE HCL 50 MG/ML IJ SOLN: 50.0000 mg | Freq: Once | INTRAMUSCULAR | Status: DC | PRN

## 2020-02-12 MED ORDER — POTASSIUM CHLORIDE CRYS ER 20 MEQ PO TBCR
40.0000 meq | EXTENDED_RELEASE_TABLET | Freq: Once | ORAL | Status: DC
  Filled 2020-02-12: qty 2

## 2020-02-12 MED ORDER — ALBUTEROL SULFATE HFA 108 (90 BASE) MCG/ACT IN AERS: 2.0000 | INHALATION_SPRAY | Freq: Once | RESPIRATORY_TRACT | Status: DC | PRN

## 2020-02-12 MED ORDER — EPINEPHRINE 0.3 MG/0.3ML IJ SOAJ: 0.3000 mg | Freq: Once | INTRAMUSCULAR | Status: DC | PRN

## 2020-02-12 MED ORDER — LISINOPRIL 20 MG PO TABS
20.0000 mg | ORAL_TABLET | Freq: Every day | ORAL | Status: DC
  Administered 2020-02-12: 20 mg via ORAL
  Filled 2020-02-12: qty 1

## 2020-02-12 MED ORDER — METHYLPREDNISOLONE SODIUM SUCC 125 MG IJ SOLR: 125.0000 mg | Freq: Once | INTRAMUSCULAR | Status: DC | PRN

## 2020-02-12 MED ORDER — FAMOTIDINE IN NACL 20-0.9 MG/50ML-% IV SOLN
20.0000 mg | Freq: Once | INTRAVENOUS | Status: DC | PRN
  Filled 2020-02-12: qty 50

## 2020-02-12 NOTE — Progress Notes (Signed)
PROGRESS NOTE                                                                                                                                                                                                             Patient Demographics:    Sylvia Petersen, is a 86 y.o. female, DOB - 1929-05-15, MGQ:[id number]  Outpatient Primary MD for the patient is Sylvia Ada, MD   Admit date - 02/11/2020   LOS - 1  Chief Complaint  Patient presents with  . Code Sepsis       Brief Narrative: Patient is a 86 y.o. female with PMHx of HLD, HTN-who has had progressive generalized weakness for approximately 1 week-apparently 4 prior to this hospitalization-patient sustained a mechanical fall-and was on the floor for almost 12 hours-patient was helped back to her bed by neighbors-since then she has been mostly bedbound/weak and frail-family subsequently called EMS and she was brought to the ED.  Upon further evaluation-she was found to have OIZTI-45 infection, complicated UTI-and admitted to the hospitalist service.  See below for further details.  COVID-19 vaccinated status: Unvaccinated (claims that she thinks vaccinated people get more Covid than the unvaccinated)  Significant Events: 12/2>> Admit to [facility] for YKDXI-33 infection, complicated UTI, AKI  Significant studies: 12/2>>Chest x-ray: No active disease 12/3>> x-ray pelvis: No acute abnormality noted  COVID-19 medications: Monoclonal antibody infusion: 12/3 x 1  Antibiotics: Rocephin: 12/2>>  Microbiology data: 12/2 >>blood culture: No growth 12/2>> urine culture: Pending  Procedures: None  Consults: None  DVT prophylaxis: enoxaparin (LOVENOX) injection 30 mg Start: 02/11/20 2130    Subjective:    Swayze Kozuch today appears very frail and weak-she was sleeping when I walked in-but awoke-and answer most of my questions appropriately.   Assessment  & Plan :    SIRS from complicated UTI and ASNKN-39 infection: Continue supportive care-follow cultures.  Afebrile-SIRS pathophysiology slowly improving.  Complicated UTI: Continue Rocephin-await culture data.  COVID-19 infection: No major respiratory symptoms-not hypoxic-chest x-ray without pneumonia-getting MAB infusion this morning.  Watch closely for development of respiratory symptoms/hypoxia-and start steroid/Remdesivir accordingly.  Fever: Tmax 103.5  COVID-19 Labs: Recent Labs    02/11/20 2219 02/12/20 0534  DDIMER 1.82* 2.24*  CRP 10.3* 11.5*    No results found for: BNP  No results for input(s): PROCALCITON in the last 168 hours.  Lab Results  Component Value Date   [place] (A) 02/11/2020   [place] NEGATIVE 09/05/2019    AKI: Very mild-likely hemodynamically mediated-resolved with supportive care.  HTN: BP remains on the higher side-decrease IV fluids-continue metoprolol/amlodipine-restart lisinopril now that AKI is resolved.  Follow and adjust.    HLD: Continue statin-watch LFTs.  Severe debility/deconditioning: Secondary to COVID-19 infection-await PT/OT eval.  May need home health services or SNF.  ABG:    Component Value Date/Time   TCO2 25 09/03/2019 1300    Vent Settings: N/A  Condition - Extremely Guarded  Family Communication  :  Son (Sylvia Petersen-[phone]) updated over the phone 12/3  Code Status :  Full Code  Diet :  Diet Order            Diet Heart Room service appropriate? Yes; Fluid consistency: Thin  Diet effective now                  Disposition Plan  :   Status is: Inpatient  Remains inpatient appropriate because:Inpatient level of care appropriate due to severity of illness   Dispo: The patient is from: Home              Anticipated d/c is to: TBD              Anticipated d/c date is: > 3 days              Patient currently is not medically stable to d/c.  Barriers to discharge: Complicated UTI requiring IV  antibiotics-awaiting culture data, profound generalized debility/weakness-due to COVID-19 infection-needs PT eval to determine appropriate disposition.  Antimicorbials  :    Anti-infectives (From admission, onward)   Start     Dose/Rate Route Frequency Ordered Stop   02/12/20 1900  cefTRIAXone (ROCEPHIN) 1 g in sodium chloride 0.9 % 100 mL IVPB        1 g 200 mL/hr over 30 Minutes Intravenous Every 24 hours 02/11/20 2117     02/11/20 1830  cefTRIAXone (ROCEPHIN) 1 g in sodium chloride 0.9 % 100 mL IVPB        1 g 200 mL/hr over 30 Minutes Intravenous  Once 02/11/20 1818 02/11/20 2029      Inpatient Medications  Scheduled Meds: . amLODipine  2.5 mg Oral Daily  . vitamin C  500 mg Oral Daily  . enoxaparin (LOVENOX) injection  30 mg Subcutaneous Q24H  . metoprolol tartrate  50 mg Oral BID  . pravastatin  20 mg Oral q1800  . zinc sulfate  220 mg Oral Daily   Continuous Infusions: . sodium chloride 100 mL/hr at 02/12/20 0855  . sodium chloride    . cefTRIAXone (ROCEPHIN)  IV    . famotidine (PEPCID) IV     PRN Meds:.sodium chloride, acetaminophen, albuterol, diphenhydrAMINE, EPINEPHrine, famotidine (PEPCID) IV, hydrALAZINE, methylPREDNISolone (SOLU-MEDROL) injection   Time Spent in minutes  25  See all Orders from today for further details   Oren Binet M.D on 02/12/2020 at 11:24 AM  To page go to [email or web address] - use universal password  [facility] -  Office  [phone]    Objective:   Vitals:   02/12/20 0945 02/12/20 1000 02/12/20 1015 02/12/20 1045  BP: (!) 176/79 (!) 168/81 (!) 189/90 (!) 175/83  Pulse: 83 80 74 73  Resp: (!) 22 (!) 21 17 (!) 23  Temp:      TempSrc:      SpO2: 93% 94% 94% 93%  Weight:  Height:        Wt Readings from Last 3 Encounters:  02/11/20 53.1 kg  09/03/19 64 kg  04/25/16 64.6 kg    No intake or output data in the 24 hours ending 02/12/20 1124   Physical Exam Gen Exam: Awake/alert-but appears profoundly  weak/debilitated-chronically ill-appearing. HEENT:atraumatic, normocephalic Chest: B/L clear to auscultation anteriorly CVS:S1S2 regular Abdomen:soft non tender, non distended Extremities:no edema Neurology: Non focal-but with generalized weakness. Skin: no rash   Data Review:    CBC Recent Labs  Lab 02/11/20 1552 02/11/20 2219 02/12/20 0534  WBC 8.7 8.2 9.6  HGB 11.6* 11.9* 12.1  HCT 36.0 37.0 37.5  PLT 250 226 245  MCV 90.5 89.8 90.1  MCH 29.1 28.9 29.1  MCHC 32.2 32.2 32.3  RDW 13.4 13.3 13.2  LYMPHSABS 0.6*  --  0.5*  MONOABS 0.4  --  0.4  EOSABS 0.0  --  0.0  BASOSABS 0.0  --  0.0    Chemistries  Recent Labs  Lab 02/11/20 1552 02/11/20 2219 02/12/20 0534  NA 138  --  137  K 3.8  --  3.3*  CL 101  --  103  CO2 24  --  21*  GLUCOSE 113*  --  137*  BUN 23  --  23  CREATININE 1.14* 0.88 0.98  CALCIUM 9.5  --  8.6*  AST 58*  --  50*  ALT 37  --  33  ALKPHOS 50  --  49  BILITOT 0.6  --  0.5   ------------------------------------------------------------------------------------------------------------------ No results for input(s): CHOL, HDL, LDLCALC, TRIG, CHOLHDL, LDLDIRECT in the last 72 hours.  No results found for: HGBA1C ------------------------------------------------------------------------------------------------------------------ Recent Labs    02/11/20 1553  TSH 0.783   ------------------------------------------------------------------------------------------------------------------ No results for input(s): VITAMINB12, FOLATE, FERRITIN, TIBC, IRON, RETICCTPCT in the last 72 hours.  Coagulation profile No results for input(s): INR, PROTIME in the last 168 hours.  Recent Labs    02/11/20 2219 02/12/20 0534  DDIMER 1.82* 2.24*    Cardiac Enzymes No results for input(s): CKMB, TROPONINI, MYOGLOBIN in the last 168 hours.  Invalid input(s):  CK ------------------------------------------------------------------------------------------------------------------ No results found for: BNP  Micro Results Recent Results (from the past 240 hour(s))  Culture, blood (routine x 2)     Status: None (Preliminary result)   Collection Time: 02/11/20  4:02 PM   Specimen: BLOOD  Result Value Ref Range Status   Specimen Description BLOOD RIGHT ANTECUBITAL  Final   Special Requests   Final    BOTTLES DRAWN AEROBIC AND ANAEROBIC Blood Culture results may not be optimal due to an inadequate volume of blood received in culture bottles   Culture   Final    NO GROWTH < 24 HOURS Performed at [facility] [address] [place], [place] [address]    Report Status PENDING  Incomplete  Culture, blood (routine x 2)     Status: None (Preliminary result)   Collection Time: 02/11/20  5:00 PM   Specimen: BLOOD RIGHT ARM  Result Value Ref Range Status   Specimen Description BLOOD RIGHT ARM  Final   Special Requests   Final    BOTTLES DRAWN AEROBIC ONLY Blood Culture results may not be optimal due to an inadequate volume of blood received in culture bottles   Culture   Final    NO GROWTH < 24 HOURS Performed at [facility], [place] [address], [place], [place] [address]    Report Status PENDING  Incomplete  Resp Panel by RT-PCR (Flu A&B, Covid) Nasopharyngeal Swab     Status: Abnormal   Collection Time: 02/11/20  7:21 PM   Specimen: Nasopharyngeal Swab; Nasopharyngeal(NP) swabs in vial transport medium  Result Value Ref Range Status   SARS Coronavirus 2 by RT PCR POSITIVE (A) NEGATIVE Final    Comment: RESULT CALLED TO, READ BACK BY AND VERIFIED WITH: Teofilo Pod RN 02/11/20 AT 2158 SK (NOTE) SARS-CoV-2 target nucleic acids are DETECTED.  The SARS-CoV-2 RNA is generally detectable in upper respiratory specimens during the acute phase of infection. Positive results are indicative of the presence of the identified virus, but do not  rule out bacterial infection or co-infection with other pathogens not detected by the test. Clinical correlation with patient history and other diagnostic information is necessary to determine patient infection status. The expected result is Negative.  Fact Sheet for Patients: EntrepreneurPulse.com.au  Fact Sheet for Healthcare Providers: IncredibleEmployment.be  This test is not yet approved or cleared by the [place] FDA and  has been authorized for detection and/or diagnosis of SARS-CoV-2 by FDA under an Emergency Use Authorization (EUA).  This EUA will remain in effect (meaning this test can be  used) for the duration of  the COVID-19 declaration under Section 564(b)(1) of the Act, 21 U.S.C. section 360bbb-3(b)(1), unless the authorization is terminated or revoked sooner.     Influenza A by PCR NEGATIVE NEGATIVE Final   Influenza B by PCR NEGATIVE NEGATIVE Final    Comment: (NOTE) The Xpert Xpress SARS-CoV-2/FLU/RSV plus assay is intended as an aid in the diagnosis of influenza from Nasopharyngeal swab specimens and should not be used as a sole basis for treatment. Nasal washings and aspirates are unacceptable for Xpert Xpress SARS-CoV-2/FLU/RSV testing.  Fact Sheet for Patients: EntrepreneurPulse.com.au  Fact Sheet for Healthcare Providers: IncredibleEmployment.be  This test is not yet approved or cleared by the [place] FDA and has been authorized for detection and/or diagnosis of SARS-CoV-2 by FDA under an Emergency Use Authorization (EUA). This EUA will remain in effect (meaning this test can be used) for the duration of the COVID-19 declaration under Section 564(b)(1) of the Act, 21 U.S.C. section 360bbb-3(b)(1), unless the authorization is terminated or revoked.  Performed at [facility], [place] [address] [place], [place] [address]     Radiology Reports DG Pelvis 1-2  Views  Result Date: 02/12/2020 CLINICAL DATA:  Weakness and foul-smelling urine EXAM: PELVIS - 1 VIEW COMPARISON:  09/03/2019 FINDINGS: There is no evidence of pelvic fracture or diastasis. No pelvic bone lesions are seen. IMPRESSION: No acute abnormality noted. Electronically Signed   By: Inez Catalina M.D.   On: 02/12/2020 02:13   CT HEAD WO CONTRAST  Result Date: 02/12/2020 CLINICAL DATA:  Weakness for 1 week EXAM: CT HEAD WITHOUT CONTRAST CT CERVICAL SPINE WITHOUT CONTRAST TECHNIQUE: Multidetector CT imaging of the head and cervical spine was performed following the standard protocol without intravenous contrast. Multiplanar CT image reconstructions of the cervical spine were also generated. COMPARISON:  None. FINDINGS: CT HEAD FINDINGS Brain: There is no mass, hemorrhage or extra-axial collection. The size and configuration of the ventricles and extra-axial CSF spaces are normal. There is hypoattenuation of the periventricular white matter, most commonly indicating chronic ischemic microangiopathy. Vascular: No abnormal hyperdensity of the major intracranial arteries or dural venous sinuses. No intracranial atherosclerosis. Skull: The visualized skull base, calvarium and extracranial soft tissues are normal. Sinuses/Orbits: No fluid levels or advanced mucosal thickening of the visualized paranasal sinuses.  No mastoid or middle ear effusion. The orbits are normal. CT CERVICAL SPINE FINDINGS Alignment: No static subluxation. Facets are aligned. Occipital condyles are normally positioned. Skull base and vertebrae: No acute fracture. Soft tissues and spinal canal: No prevertebral fluid or swelling. No visible canal hematoma. Disc levels: No advanced spinal canal or neural foraminal stenosis. Upper chest: No pneumothorax, pulmonary nodule or pleural effusion. Other: Calcific aortic atherosclerosis. IMPRESSION: 1. Chronic ischemic microangiopathy without acute intracranial abnormality. 2. No acute fracture or  static subluxation of the cervical spine. Aortic Atherosclerosis (ICD10-I70.0). Electronically Signed   By: Ulyses Jarred M.D.   On: 02/12/2020 01:11   CT CERVICAL SPINE WO CONTRAST  Result Date: 02/12/2020 CLINICAL DATA:  Weakness for 1 week EXAM: CT HEAD WITHOUT CONTRAST CT CERVICAL SPINE WITHOUT CONTRAST TECHNIQUE: Multidetector CT imaging of the head and cervical spine was performed following the standard protocol without intravenous contrast. Multiplanar CT image reconstructions of the cervical spine were also generated. COMPARISON:  None. FINDINGS: CT HEAD FINDINGS Brain: There is no mass, hemorrhage or extra-axial collection. The size and configuration of the ventricles and extra-axial CSF spaces are normal. There is hypoattenuation of the periventricular white matter, most commonly indicating chronic ischemic microangiopathy. Vascular: No abnormal hyperdensity of the major intracranial arteries or dural venous sinuses. No intracranial atherosclerosis. Skull: The visualized skull base, calvarium and extracranial soft tissues are normal. Sinuses/Orbits: No fluid levels or advanced mucosal thickening of the visualized paranasal sinuses. No mastoid or middle ear effusion. The orbits are normal. CT CERVICAL SPINE FINDINGS Alignment: No static subluxation. Facets are aligned. Occipital condyles are normally positioned. Skull base and vertebrae: No acute fracture. Soft tissues and spinal canal: No prevertebral fluid or swelling. No visible canal hematoma. Disc levels: No advanced spinal canal or neural foraminal stenosis. Upper chest: No pneumothorax, pulmonary nodule or pleural effusion. Other: Calcific aortic atherosclerosis. IMPRESSION: 1. Chronic ischemic microangiopathy without acute intracranial abnormality. 2. No acute fracture or static subluxation of the cervical spine. Aortic Atherosclerosis (ICD10-I70.0). Electronically Signed   By: Ulyses Jarred M.D.   On: 02/12/2020 01:11   DG Chest Portable 1  View  Result Date: 02/11/2020 CLINICAL DATA:  Weakness. EXAM: PORTABLE CHEST 1 VIEW COMPARISON:  CT chest 12/0 scratch the CT chest 02/19/2019. FINDINGS: The lungs clear. Heart size normal. Aortic atherosclerosis. No pneumothorax or pleural fluid. IMPRESSION: No acute disease. Aortic Atherosclerosis (ICD10-I70.0). Electronically Signed   By: Inge Rise M.D.   On: 02/11/2020 17:46

## 2020-02-12 NOTE — ED Notes (Signed)
Brief changed, bedding changed. Pt with urine and BM soft, mod amt.

## 2020-02-12 NOTE — ED Notes (Signed)
Spoke with patients son and given update.

## 2020-02-12 NOTE — ED Notes (Signed)
Breakfast Ordered 

## 2020-02-12 NOTE — Plan of Care (Signed)
  Problem: Education: Goal: Knowledge of risk factors and measures for prevention of condition will improve Outcome: Progressing   Problem: Coping: Goal: Psychosocial and spiritual needs will be supported Outcome: Progressing   Problem: Respiratory: Goal: Will maintain a patent airway Outcome: Progressing Goal: Complications related to the disease process, condition or treatment will be avoided or minimized Outcome: Progressing   

## 2020-02-12 NOTE — Progress Notes (Signed)
Pt admitted from ED.  Report received from [place], [place].  Orders acknowledged, and care initiated per orders.  Pt is very lethargic/fatigued to answer admission questions.  Pt's son [name]) called, he was updated on mother's condition and he helped answer admission questions.  [name] (son) raised concerns about pt's discharge back home (where she lives alone).  [name] states pt declines nursing homes before but she did attend [facility] in the past and she also had [place] PT/OT come home in the past.

## 2020-02-12 NOTE — ED Notes (Signed)
Pt's brief and bedding changed. 

## 2020-02-12 NOTE — ED Notes (Signed)
Report called to [place], RN.

## 2020-02-12 NOTE — ED Notes (Signed)
Pt transported to CT by RN.

## 2020-02-12 NOTE — ED Notes (Signed)
Spoke with lab regarding add on of CK to lab tubes sent earlier.

## 2020-02-13 ENCOUNTER — Inpatient Hospital Stay (HOSPITAL_COMMUNITY): Payer: PPO

## 2020-02-13 LAB — OSMOLALITY: Osmolality: 308 mOsm/kg — ABNORMAL HIGH (ref 275–295)

## 2020-02-13 LAB — MAGNESIUM: Magnesium: 2.2 mg/dL (ref 1.7–2.4)

## 2020-02-13 LAB — COMPREHENSIVE METABOLIC PANEL
ALT: 33 U/L (ref 0–44)
AST: 56 U/L — ABNORMAL HIGH (ref 15–41)
Albumin: 2.5 g/dL — ABNORMAL LOW (ref 3.5–5.0)
Alkaline Phosphatase: 48 U/L (ref 38–126)
Anion gap: 11 (ref 5–15)
BUN: 36 mg/dL — ABNORMAL HIGH (ref 8–23)
CO2: 19 mmol/L — ABNORMAL LOW (ref 22–32)
Calcium: 8.2 mg/dL — ABNORMAL LOW (ref 8.9–10.3)
Chloride: 111 mmol/L (ref 98–111)
Creatinine, Ser: 1.48 mg/dL — ABNORMAL HIGH (ref 0.44–1.00)
GFR, Estimated: 34 mL/min — ABNORMAL LOW (ref >60)
Glucose, Bld: 138 mg/dL — ABNORMAL HIGH (ref 70–99)
Potassium: 3.3 mmol/L — ABNORMAL LOW (ref 3.5–5.1)
Sodium: 141 mmol/L (ref 135–145)
Total Bilirubin: 0.4 mg/dL (ref 0.3–1.2)
Total Protein: 6 g/dL — ABNORMAL LOW (ref 6.5–8.1)

## 2020-02-13 LAB — URINALYSIS, ROUTINE W REFLEX MICROSCOPIC
Bilirubin Urine: NEGATIVE
Glucose, UA: NEGATIVE mg/dL
Ketones, ur: NEGATIVE mg/dL
Nitrite: POSITIVE — AB
Protein, ur: 100 mg/dL — AB
Specific Gravity, Urine: 1.011 (ref 1.005–1.030)
WBC, UA: >50 WBC/hpf — ABNORMAL HIGH (ref 0–5)
pH: 6 (ref 5.0–8.0)

## 2020-02-13 LAB — CBC WITH DIFFERENTIAL/PLATELET
Abs Immature Granulocytes: 0.07 10*3/uL (ref 0.00–0.07)
Basophils Absolute: 0 10*3/uL (ref 0.0–0.1)
Basophils Relative: 0 %
Eosinophils Absolute: 0 10*3/uL (ref 0.0–0.5)
Eosinophils Relative: 0 %
HCT: 34.2 % — ABNORMAL LOW (ref 36.0–46.0)
Hemoglobin: 11.6 g/dL — ABNORMAL LOW (ref 12.0–15.0)
Immature Granulocytes: 1 %
Lymphocytes Relative: 5 %
Lymphs Abs: 0.6 10*3/uL — ABNORMAL LOW (ref 0.7–4.0)
MCH: 29.4 pg (ref 26.0–34.0)
MCHC: 33.9 g/dL (ref 30.0–36.0)
MCV: 86.6 fL (ref 80.0–100.0)
Monocytes Absolute: 0.2 10*3/uL (ref 0.1–1.0)
Monocytes Relative: 2 %
Neutro Abs: 10.8 10*3/uL — ABNORMAL HIGH (ref 1.7–7.7)
Neutrophils Relative %: 92 %
Platelets: 243 10*3/uL (ref 150–400)
RBC: 3.95 MIL/uL (ref 3.87–5.11)
RDW: 13.8 % (ref 11.5–15.5)
WBC: 11.7 10*3/uL — ABNORMAL HIGH (ref 4.0–10.5)
nRBC: 0 % (ref 0.0–0.2)

## 2020-02-13 LAB — OSMOLALITY, URINE: Osmolality, Ur: 412 mOsm/kg (ref 300–900)

## 2020-02-13 LAB — SODIUM, URINE, RANDOM: Sodium, Ur: 67 mmol/L

## 2020-02-13 LAB — URIC ACID: Uric Acid, Serum: 4.7 mg/dL (ref 2.5–7.1)

## 2020-02-13 LAB — BRAIN NATRIURETIC PEPTIDE: B Natriuretic Peptide: 170.2 pg/mL — ABNORMAL HIGH (ref 0.0–100.0)

## 2020-02-13 LAB — PROCALCITONIN: Procalcitonin: 1.99 ng/mL

## 2020-02-13 LAB — C-REACTIVE PROTEIN: CRP: 17.4 mg/dL — ABNORMAL HIGH (ref <1.0)

## 2020-02-13 LAB — CK: Total CK: 243 U/L — ABNORMAL HIGH (ref 38–234)

## 2020-02-13 LAB — CREATININE, URINE, RANDOM: Creatinine, Urine: 62.98 mg/dL

## 2020-02-13 LAB — D-DIMER, QUANTITATIVE: D-Dimer, Quant: 3.35 ug/mL-FEU — ABNORMAL HIGH (ref 0.00–0.50)

## 2020-02-13 MED ORDER — DOXYCYCLINE HYCLATE 100 MG PO TABS
100.0000 mg | ORAL_TABLET | Freq: Two times a day (BID) | ORAL | Status: AC
  Administered 2020-02-13 – 2020-02-17 (×10): 100 mg via ORAL
  Filled 2020-02-13 (×10): qty 1

## 2020-02-13 MED ORDER — SODIUM CHLORIDE 0.9 % IV SOLN
1.0000 g | INTRAVENOUS | Status: AC
  Administered 2020-02-13 – 2020-02-18 (×6): 1 g via INTRAVENOUS
  Filled 2020-02-13 (×6): qty 10

## 2020-02-13 MED ORDER — AMLODIPINE BESYLATE 10 MG PO TABS
10.0000 mg | ORAL_TABLET | Freq: Every day | ORAL | Status: DC
  Administered 2020-02-14 – 2020-02-22 (×9): 10 mg via ORAL
  Filled 2020-02-13 (×10): qty 1

## 2020-02-13 MED ORDER — TAMSULOSIN HCL 0.4 MG PO CAPS
0.4000 mg | ORAL_CAPSULE | Freq: Every day | ORAL | Status: DC
  Administered 2020-02-13 – 2020-02-23 (×11): 0.4 mg via ORAL
  Filled 2020-02-13 (×11): qty 1

## 2020-02-13 MED ORDER — DEXAMETHASONE SODIUM PHOSPHATE 10 MG/ML IJ SOLN
6.0000 mg | INTRAMUSCULAR | Status: DC
  Administered 2020-02-13 – 2020-02-19 (×7): 6 mg via INTRAVENOUS
  Filled 2020-02-13 (×7): qty 1

## 2020-02-13 MED ORDER — POTASSIUM CHLORIDE 20 MEQ PO PACK
40.0000 meq | PACK | Freq: Once | ORAL | Status: AC
  Administered 2020-02-13: 40 meq via ORAL
  Filled 2020-02-13: qty 2

## 2020-02-13 MED ORDER — POTASSIUM CHLORIDE 2 MEQ/ML IV SOLN
INTRAVENOUS | Status: AC
  Filled 2020-02-13: qty 1000

## 2020-02-13 MED ORDER — PANTOPRAZOLE SODIUM 40 MG PO TBEC
40.0000 mg | DELAYED_RELEASE_TABLET | Freq: Every day | ORAL | Status: DC
  Administered 2020-02-13: 40 mg via ORAL
  Filled 2020-02-13: qty 1

## 2020-02-13 MED ORDER — POTASSIUM CHLORIDE 2 MEQ/ML IV SOLN: INTRAVENOUS | Status: DC

## 2020-02-13 MED ORDER — PANTOPRAZOLE SODIUM 40 MG PO PACK
40.0000 mg | PACK | Freq: Every day | ORAL | Status: DC
  Administered 2020-02-13 – 2020-02-23 (×11): 40 mg via ORAL
  Filled 2020-02-13 (×11): qty 20

## 2020-02-13 NOTE — Progress Notes (Signed)
PROGRESS NOTE                                                                                                                                                                                                             Patient Demographics:    Sylvia Petersen, is a 86 y.o. female, DOB - [birth year], ZOX:[id number]  Outpatient Primary MD for the patient is [name], MD   Admit date - 02/11/2020   LOS - 2  Chief Complaint  Patient presents with  . Code Sepsis       Brief Narrative: Patient is a 86 y.o. female with PMHx of HLD, HTN-who has had progressive generalized weakness for approximately 1 week-apparently 4 prior to this hospitalization-patient sustained a mechanical fall-and was on the floor for almost 12 hours-patient was helped back to her bed by neighbors-since then she has been mostly bedbound/weak and frail-family subsequently called EMS and she was brought to the ED.  Upon further evaluation-she was found to have [id number] infection, complicated UTI-and admitted to the hospitalist service.  See below for further details.  COVID-19 vaccinated status: Unvaccinated (claims that she thinks vaccinated people get more Covid than the unvaccinated)  Significant Events: 12/2>> Admit to [facility] for [id number]-21 infection, complicated UTI, AKI  Significant studies: 12/2>>Chest x-ray: No active disease 12/3>> x-ray pelvis: No acute abnormality noted  COVID-19 medications: Monoclonal antibody infusion: 12/3 x 1  Antibiotics: Rocephin: 12/2>>  Microbiology data: 12/2 >>blood culture: No growth 12/2>> urine culture: Pending  Procedures: None  Consults: None  DVT prophylaxis: enoxaparin (LOVENOX) injection 30 mg Start: 02/11/20 2130    Subjective:   Patient in bed, appears comfortable, denies any headache, no fever, no chest pain or pressure, mild shortness of breath , no abdominal pain. No focal weakness.     Assessment  & Plan :   Sepsis from complicated UTI and [id number] infection: Continue supportive care-follow cultures.  Afebrile-sepsis pathophysiology slowly improving.  Complicated UTI: Continue Rocephin-await culture data.  COVID-19 infection: Despite chest x-ray being clear now CRP is creeping up and she is mildly short of breath, add 2 L oxygen and Decadron.  Monitor if worse will add Remdesivir as well.    Recent Labs  Lab 02/11/20 1552 02/11/20 1602 02/11/20 1921 02/11/20 2026 02/11/20 2219 02/12/20 0534 02/13/20 0352  WBC 8.7  --   --   --  8.2 9.6 11.7*  HGB 11.6*  --   --   --  11.9* 12.1 11.6*  HCT 36.0  --   --   --  37.0 37.5 34.2*  PLT 250  --   --   --  226 245 243  CRP  --   --   --   --  10.3* 11.5* 17.4*  BNP  --   --   --   --   --   --  170.2*  DDIMER  --   --   --   --  1.82* 2.24* 3.35*  PROCALCITON  --   --   --   --   --   --  1.99  AST 58*  --   --   --   --  50* 56*  ALT 37  --   --   --   --  33 33  ALKPHOS 50  --   --   --   --  49 48  BILITOT 0.6  --   --   --   --  0.5 0.4  ALBUMIN 3.3*  --   --   --   --  2.8* 2.5*  LATICACIDVEN  --  1.3  --  1.6  --   --   --   SARSCOV2NAA  --   --  POSITIVE*  --   --   --   --         AKI: With reported fall at home and prolonged downtime, check CK levels, AKI worsening, urine electrolytes ordered, check bladder scan and renal ultrasound, hydrate with IV fluids.  Stop ACE inhibitor.  HTN: On Norvasc and beta-blocker.  ACE inhibitor held due to AKI.  As needed hydralazine added.  HLD: Continue statin-watch LFTs.    Severe debility/deconditioning: Secondary to COVID-19 infection-await PT/OT eval.  May need home health services or SNF.  Condition - Extremely Guarded  Family Communication  :  Son [phone]) updated over the phone 02/13/20  Code Status : DNR  Diet :  Diet Order            Diet Heart Room service appropriate? Yes; Fluid consistency: Thin  Diet effective now                   Disposition Plan  :   Status is: Inpatient  Remains inpatient appropriate because:Inpatient level of care appropriate due to severity of illness   Dispo: The patient is from: Home              Anticipated d/c is to: TBD              Anticipated d/c date is: > 3 days              Patient currently is not medically stable to d/c.  Barriers to discharge: Complicated UTI requiring IV antibiotics-awaiting culture data, profound generalized debility/weakness-due to COVID-19 infection-needs PT eval to determine appropriate disposition.  Antimicorbials  :    Anti-infectives (From admission, onward)   Start     Dose/Rate Route Frequency Ordered Stop   02/13/20 1900  cefTRIAXone (ROCEPHIN) 1 g in sodium chloride 0.9 % 100 mL IVPB        1 g 200 mL/hr over 30 Minutes Intravenous Every 24 hours 02/13/20 0917 02/18/20 1859   02/13/20 1015  doxycycline (VIBRA-TABS) tablet 100 mg        100 mg Oral Every 12 hours 02/13/20 0917  02/18/20 0959   02/12/20 1900  cefTRIAXone (ROCEPHIN) 1 g in sodium chloride 0.9 % 100 mL IVPB  Status:  Discontinued        1 g 200 mL/hr over 30 Minutes Intravenous Every 24 hours 02/11/20 2117 02/13/20 0917   02/11/20 1830  cefTRIAXone (ROCEPHIN) 1 g in sodium chloride 0.9 % 100 mL IVPB        1 g 200 mL/hr over 30 Minutes Intravenous  Once 02/11/20 1818 02/11/20 2029      Inpatient Medications  Scheduled Meds: . [START ON 02/14/2020] amLODipine  10 mg Oral Daily  . vitamin C  500 mg Oral Daily  . dexamethasone (DECADRON) injection  6 mg Intravenous Q24H  . doxycycline  100 mg Oral Q12H  . enoxaparin (LOVENOX) injection  30 mg Subcutaneous Q24H  . metoprolol tartrate  50 mg Oral BID  . potassium chloride  40 mEq Oral Once  . pravastatin  20 mg Oral q1800  . zinc sulfate  220 mg Oral Daily   Continuous Infusions: . sodium chloride    . cefTRIAXone (ROCEPHIN)  IV    . famotidine (PEPCID) IV    . lactated ringers with KCl/Additives Pediatric custom IV fluid      PRN Meds:.sodium chloride, acetaminophen, albuterol, famotidine (PEPCID) IV, hydrALAZINE   Time Spent in minutes  25  See all Orders from today for further details   Lala Lund M.D on 02/13/2020 at 9:18 AM  To page go to [email or web address] - use universal password  [facility] -  Office  [phone]    Objective:   Vitals:   02/12/20 1511 02/12/20 2056 02/13/20 0449 02/13/20 0752  BP: (!) 153/60 (!) 154/63 (!) 144/63 (!) 152/67  Pulse: 78 88 95 93  Resp: 18 20 (!) 21 20  Temp: 97.8 F (36.6 C) 97.9 F (36.6 C) 97.9 F (36.6 C) 98.2 F (36.8 C)  TempSrc: Oral Oral Axillary Axillary  SpO2: 92% 92% 90% 90%  Weight:      Height:        Wt Readings from Last 3 Encounters:  02/11/20 53.1 kg  09/03/19 64 kg  04/25/16 64.6 kg     Intake/Output Summary (Last 24 hours) at 02/13/2020 0918 Last data filed at 02/13/2020 0846 Gross per 24 hour  Intake 1554.99 ml  Output --  Net 1554.99 ml     Physical Exam  Awake Alert, No new F.N deficits, Normal affect [place].AT,PERRAL Supple Neck,No JVD, No cervical lymphadenopathy appriciated.  Symmetrical Chest wall movement, Good air movement bilaterally, CTAB RRR,No Gallops, Rubs or new Murmurs, No Parasternal Heave +ve B.Sounds, Abd Soft, No tenderness, No organomegaly appriciated, No rebound - guarding or rigidity. No Cyanosis, Clubbing or edema, No new Rash or bruise    Data Review:    CBC Recent Labs  Lab 02/11/20 1552 02/11/20 2219 02/12/20 0534 02/13/20 0352  WBC 8.7 8.2 9.6 11.7*  HGB 11.6* 11.9* 12.1 11.6*  HCT 36.0 37.0 37.5 34.2*  PLT 250 226 245 243  MCV 90.5 89.8 90.1 86.6  MCH 29.1 28.9 29.1 29.4  MCHC 32.2 32.2 32.3 33.9  RDW 13.4 13.3 13.2 13.8  LYMPHSABS 0.6*  --  0.5* 0.6*  MONOABS 0.4  --  0.4 0.2  EOSABS 0.0  --  0.0 0.0  BASOSABS 0.0  --  0.0 0.0    Chemistries  Recent Labs  Lab 02/11/20 1552 02/11/20 2219 02/12/20 0534 02/13/20 0352  NA 138  --  137 141  K 3.8  --  3.3* 3.3*   CL 101  --  103 111  CO2 24  --  21* 19*  GLUCOSE 113*  --  137* 138*  BUN 23  --  23 36*  CREATININE 1.14* 0.88 0.98 1.48*  CALCIUM 9.5  --  8.6* 8.2*  MG  --   --   --  2.2  AST 58*  --  50* 56*  ALT 37  --  33 33  ALKPHOS 50  --  49 48  BILITOT 0.6  --  0.5 0.4   ------------------------------------------------------------------------------------------------------------------ No results for input(s): CHOL, HDL, LDLCALC, TRIG, CHOLHDL, LDLDIRECT in the last 72 hours.  No results found for: HGBA1C ------------------------------------------------------------------------------------------------------------------ Recent Labs    02/11/20 1553  TSH 0.783   ------------------------------------------------------------------------------------------------------------------ No results for input(s): VITAMINB12, FOLATE, FERRITIN, TIBC, IRON, RETICCTPCT in the last 72 hours.  Coagulation profile No results for input(s): INR, PROTIME in the last 168 hours.  Recent Labs    02/12/20 0534 02/13/20 0352  DDIMER 2.24* 3.35*    Cardiac Enzymes No results for input(s): CKMB, TROPONINI, MYOGLOBIN in the last 168 hours.  Invalid input(s): CK ------------------------------------------------------------------------------------------------------------------    Component Value Date/Time   BNP 170.2 (H) 02/13/2020 0352    Micro Results Recent Results (from the past 240 hour(s))  Culture, blood (routine x 2)     Status: None (Preliminary result)   Collection Time: 02/11/20  4:02 PM   Specimen: BLOOD  Result Value Ref Range Status   Specimen Description BLOOD RIGHT ANTECUBITAL  Final   Special Requests   Final    BOTTLES DRAWN AEROBIC AND ANAEROBIC Blood Culture results may not be optimal due to an inadequate volume of blood received in culture bottles   Culture   Final    NO GROWTH 2 DAYS Performed at [facility], [place] [address] [place], [place] [address]    Report Status  PENDING  Incomplete  Culture, blood (routine x 2)     Status: None (Preliminary result)   Collection Time: 02/11/20  5:00 PM   Specimen: BLOOD RIGHT ARM  Result Value Ref Range Status   Specimen Description BLOOD RIGHT ARM  Final   Special Requests   Final    BOTTLES DRAWN AEROBIC ONLY Blood Culture results may not be optimal due to an inadequate volume of blood received in culture bottles   Culture   Final    NO GROWTH 2 DAYS Performed at [facility], [place] [address] [place], [place] [address]    Report Status PENDING  Incomplete  Resp Panel by RT-PCR (Flu A&B, Covid) Nasopharyngeal Swab     Status: Abnormal   Collection Time: 02/11/20  7:21 PM   Specimen: Nasopharyngeal Swab; Nasopharyngeal(NP) swabs in vial transport medium  Result Value Ref Range Status   SARS Coronavirus 2 by RT PCR POSITIVE (A) NEGATIVE Final    Comment: RESULT CALLED TO, READ BACK BY AND VERIFIED WITH: Teofilo Pod RN 02/11/20 AT 2158 SK (NOTE) SARS-CoV-2 target nucleic acids are DETECTED.  The SARS-CoV-2 RNA is generally detectable in upper respiratory specimens during the acute phase of infection. Positive results are indicative of the presence of the identified virus, but do not rule out bacterial infection or co-infection with other pathogens not detected by the test. Clinical correlation with patient history and other diagnostic information is necessary to determine patient infection status. The expected result is Negative.  Fact Sheet for Patients: EntrepreneurPulse.com.au  Fact Sheet for Healthcare Providers: IncredibleEmployment.be  This  test is not yet approved or cleared by the [place] and  has been authorized for detection and/or diagnosis of SARS-CoV-2 by FDA under an Emergency Use Authorization (EUA).  This EUA will remain in effect (meaning this test can be  used) for the duration of  the COVID-19 declaration under Section 564(b)(1) of the  Act, 21 U.S.C. section 360bbb-3(b)(1), unless the authorization is terminated or revoked sooner.     Influenza A by PCR NEGATIVE NEGATIVE Final   Influenza B by PCR NEGATIVE NEGATIVE Final    Comment: (NOTE) The Xpert Xpress SARS-CoV-2/FLU/RSV plus assay is intended as an aid in the diagnosis of influenza from Nasopharyngeal swab specimens and should not be used as a sole basis for treatment. Nasal washings and aspirates are unacceptable for Xpert Xpress SARS-CoV-2/FLU/RSV testing.  Fact Sheet for Patients: EntrepreneurPulse.com.au  Fact Sheet for Healthcare Providers: IncredibleEmployment.be  This test is not yet approved or cleared by the [place] FDA and has been authorized for detection and/or diagnosis of SARS-CoV-2 by FDA under an Emergency Use Authorization (EUA). This EUA will remain in effect (meaning this test can be used) for the duration of the COVID-19 declaration under Section 564(b)(1) of the Act, 21 U.S.C. section 360bbb-3(b)(1), unless the authorization is terminated or revoked.  Performed at [facility], [place] [address], [place], [place] [address]     Radiology Reports DG Pelvis 1-2 Views  Result Date: 02/12/2020 CLINICAL DATA:  Weakness and foul-smelling urine EXAM: PELVIS - 1 VIEW COMPARISON:  09/03/2019 FINDINGS: There is no evidence of pelvic fracture or diastasis. No pelvic bone lesions are seen. IMPRESSION: No acute abnormality noted. Electronically Signed   By: Inez Catalina M.D.   On: 02/12/2020 02:13   CT HEAD WO CONTRAST  Result Date: 02/12/2020 CLINICAL DATA:  Weakness for 1 week EXAM: CT HEAD WITHOUT CONTRAST CT CERVICAL SPINE WITHOUT CONTRAST TECHNIQUE: Multidetector CT imaging of the head and cervical spine was performed following the standard protocol without intravenous contrast. Multiplanar CT image reconstructions of the cervical spine were also generated. COMPARISON:  None. FINDINGS: CT HEAD  FINDINGS Brain: There is no mass, hemorrhage or extra-axial collection. The size and configuration of the ventricles and extra-axial CSF spaces are normal. There is hypoattenuation of the periventricular white matter, most commonly indicating chronic ischemic microangiopathy. Vascular: No abnormal hyperdensity of the major intracranial arteries or dural venous sinuses. No intracranial atherosclerosis. Skull: The visualized skull base, calvarium and extracranial soft tissues are normal. Sinuses/Orbits: No fluid levels or advanced mucosal thickening of the visualized paranasal sinuses. No mastoid or middle ear effusion. The orbits are normal. CT CERVICAL SPINE FINDINGS Alignment: No static subluxation. Facets are aligned. Occipital condyles are normally positioned. Skull base and vertebrae: No acute fracture. Soft tissues and spinal canal: No prevertebral fluid or swelling. No visible canal hematoma. Disc levels: No advanced spinal canal or neural foraminal stenosis. Upper chest: No pneumothorax, pulmonary nodule or pleural effusion. Other: Calcific aortic atherosclerosis. IMPRESSION: 1. Chronic ischemic microangiopathy without acute intracranial abnormality. 2. No acute fracture or static subluxation of the cervical spine. Aortic Atherosclerosis (ICD10-I70.0). Electronically Signed   By: Ulyses Jarred M.D.   On: 02/12/2020 01:11   CT CERVICAL SPINE WO CONTRAST  Result Date: 02/12/2020 CLINICAL DATA:  Weakness for 1 week EXAM: CT HEAD WITHOUT CONTRAST CT CERVICAL SPINE WITHOUT CONTRAST TECHNIQUE: Multidetector CT imaging of the head and cervical spine was performed following the standard protocol without intravenous contrast. Multiplanar CT image reconstructions of the  cervical spine were also generated. COMPARISON:  None. FINDINGS: CT HEAD FINDINGS Brain: There is no mass, hemorrhage or extra-axial collection. The size and configuration of the ventricles and extra-axial CSF spaces are normal. There is  hypoattenuation of the periventricular white matter, most commonly indicating chronic ischemic microangiopathy. Vascular: No abnormal hyperdensity of the major intracranial arteries or dural venous sinuses. No intracranial atherosclerosis. Skull: The visualized skull base, calvarium and extracranial soft tissues are normal. Sinuses/Orbits: No fluid levels or advanced mucosal thickening of the visualized paranasal sinuses. No mastoid or middle ear effusion. The orbits are normal. CT CERVICAL SPINE FINDINGS Alignment: No static subluxation. Facets are aligned. Occipital condyles are normally positioned. Skull base and vertebrae: No acute fracture. Soft tissues and spinal canal: No prevertebral fluid or swelling. No visible canal hematoma. Disc levels: No advanced spinal canal or neural foraminal stenosis. Upper chest: No pneumothorax, pulmonary nodule or pleural effusion. Other: Calcific aortic atherosclerosis. IMPRESSION: 1. Chronic ischemic microangiopathy without acute intracranial abnormality. 2. No acute fracture or static subluxation of the cervical spine. Aortic Atherosclerosis (ICD10-I70.0). Electronically Signed   By: Ulyses Jarred M.D.   On: 02/12/2020 01:11   DG Chest Port 1 View  Result Date: 02/13/2020 CLINICAL DATA:  Shortness of breath, COVID-19 positivity EXAM: PORTABLE CHEST 1 VIEW COMPARISON:  02/11/2020 FINDINGS: Cardiac shadow is stable. Aortic calcifications are noted. The lungs are well aerated bilaterally. No focal infiltrate or sizable effusion is seen. No acute bony abnormality is seen. IMPRESSION: No acute abnormality noted. Electronically Signed   By: Inez Catalina M.D.   On: 02/13/2020 08:52   DG Chest Portable 1 View  Result Date: 02/11/2020 CLINICAL DATA:  Weakness. EXAM: PORTABLE CHEST 1 VIEW COMPARISON:  CT chest 12/0 scratch the CT chest 02/19/2019. FINDINGS: The lungs clear. Heart size normal. Aortic atherosclerosis. No pneumothorax or pleural fluid. IMPRESSION: No acute  disease. Aortic Atherosclerosis (ICD10-I70.0). Electronically Signed   By: Inge Rise M.D.   On: 02/11/2020 17:46

## 2020-02-14 ENCOUNTER — Other Ambulatory Visit (HOSPITAL_COMMUNITY): Payer: PPO

## 2020-02-14 ENCOUNTER — Inpatient Hospital Stay (HOSPITAL_COMMUNITY): Payer: PPO

## 2020-02-14 LAB — URINE CULTURE: Culture: >=100000 — AB

## 2020-02-14 LAB — COMPREHENSIVE METABOLIC PANEL
ALT: 126 U/L — ABNORMAL HIGH (ref 0–44)
AST: 192 U/L — ABNORMAL HIGH (ref 15–41)
Albumin: 2.3 g/dL — ABNORMAL LOW (ref 3.5–5.0)
Alkaline Phosphatase: 44 U/L (ref 38–126)
Anion gap: 10 (ref 5–15)
BUN: 37 mg/dL — ABNORMAL HIGH (ref 8–23)
CO2: 22 mmol/L (ref 22–32)
Calcium: 8.3 mg/dL — ABNORMAL LOW (ref 8.9–10.3)
Chloride: 114 mmol/L — ABNORMAL HIGH (ref 98–111)
Creatinine, Ser: 1.03 mg/dL — ABNORMAL HIGH (ref 0.44–1.00)
GFR, Estimated: 52 mL/min — ABNORMAL LOW (ref >60)
Glucose, Bld: 126 mg/dL — ABNORMAL HIGH (ref 70–99)
Potassium: 3.4 mmol/L — ABNORMAL LOW (ref 3.5–5.1)
Sodium: 146 mmol/L — ABNORMAL HIGH (ref 135–145)
Total Bilirubin: 0.5 mg/dL (ref 0.3–1.2)
Total Protein: 5.7 g/dL — ABNORMAL LOW (ref 6.5–8.1)

## 2020-02-14 LAB — CBC WITH DIFFERENTIAL/PLATELET
Abs Immature Granulocytes: 0.05 10*3/uL (ref 0.00–0.07)
Basophils Absolute: 0 10*3/uL (ref 0.0–0.1)
Basophils Relative: 0 %
Eosinophils Absolute: 0 10*3/uL (ref 0.0–0.5)
Eosinophils Relative: 0 %
HCT: 34.2 % — ABNORMAL LOW (ref 36.0–46.0)
Hemoglobin: 10.9 g/dL — ABNORMAL LOW (ref 12.0–15.0)
Immature Granulocytes: 1 %
Lymphocytes Relative: 12 %
Lymphs Abs: 0.8 10*3/uL (ref 0.7–4.0)
MCH: 28.6 pg (ref 26.0–34.0)
MCHC: 31.9 g/dL (ref 30.0–36.0)
MCV: 89.8 fL (ref 80.0–100.0)
Monocytes Absolute: 0.3 10*3/uL (ref 0.1–1.0)
Monocytes Relative: 5 %
Neutro Abs: 5.3 10*3/uL (ref 1.7–7.7)
Neutrophils Relative %: 82 %
Platelets: 210 10*3/uL (ref 150–400)
RBC: 3.81 MIL/uL — ABNORMAL LOW (ref 3.87–5.11)
RDW: 13.9 % (ref 11.5–15.5)
WBC: 6.4 10*3/uL (ref 4.0–10.5)
nRBC: 0 % (ref 0.0–0.2)

## 2020-02-14 LAB — D-DIMER, QUANTITATIVE: D-Dimer, Quant: 3.32 ug/mL-FEU — ABNORMAL HIGH (ref 0.00–0.50)

## 2020-02-14 LAB — BRAIN NATRIURETIC PEPTIDE: B Natriuretic Peptide: 141.3 pg/mL — ABNORMAL HIGH (ref 0.0–100.0)

## 2020-02-14 LAB — C-REACTIVE PROTEIN: CRP: 11.9 mg/dL — ABNORMAL HIGH (ref <1.0)

## 2020-02-14 LAB — OSMOLALITY, URINE: Osmolality, Ur: 597 mOsm/kg (ref 300–900)

## 2020-02-14 LAB — OSMOLALITY: Osmolality: 314 mOsm/kg — ABNORMAL HIGH (ref 275–295)

## 2020-02-14 LAB — URIC ACID: Uric Acid, Serum: 4.2 mg/dL (ref 2.5–7.1)

## 2020-02-14 LAB — MAGNESIUM: Magnesium: 2.3 mg/dL (ref 1.7–2.4)

## 2020-02-14 LAB — PROCALCITONIN: Procalcitonin: 1.19 ng/mL

## 2020-02-14 LAB — SODIUM, URINE, RANDOM: Sodium, Ur: 38 mmol/L

## 2020-02-14 MED ORDER — ENOXAPARIN SODIUM 30 MG/0.3ML ~~LOC~~ SOLN: 30.0000 mg | Freq: Two times a day (BID) | SUBCUTANEOUS | Status: DC

## 2020-02-14 MED ORDER — POTASSIUM CHLORIDE 2 MEQ/ML IV SOLN
INTRAVENOUS | Status: DC
  Filled 2020-02-14 (×2): qty 1000

## 2020-02-14 MED ORDER — LACTATED RINGERS IV SOLN
INTRAVENOUS | Status: DC
Start: 2020-02-14 — End: 2020-02-14

## 2020-02-14 MED ORDER — CHLORHEXIDINE GLUCONATE CLOTH 2 % EX PADS
6.0000 | MEDICATED_PAD | Freq: Every day | CUTANEOUS | Status: DC
  Administered 2020-02-14 – 2020-02-23 (×10): 6 via TOPICAL

## 2020-02-14 MED ORDER — ENOXAPARIN SODIUM 40 MG/0.4ML ~~LOC~~ SOLN
40.0000 mg | SUBCUTANEOUS | Status: DC
  Administered 2020-02-14 – 2020-02-22 (×9): 40 mg via SUBCUTANEOUS
  Filled 2020-02-14 (×9): qty 0.4

## 2020-02-14 NOTE — Progress Notes (Addendum)
PROGRESS NOTE                                                                                                                                                                                                             Patient Demographics:    Sylvia Petersen, is a 86 y.o. female, DOB - 03/16/1929, TIW:[id number]  Outpatient Primary MD for the patient is [name], MD   Admit date - 02/11/2020   LOS - 3  Chief Complaint  Patient presents with  . Code Sepsis       Brief Narrative: Patient is a 86 y.o. female with PMHx of HLD, HTN-who has had progressive generalized weakness for approximately 1 week-apparently 4 prior to this hospitalization-patient sustained a mechanical fall-and was on the floor for almost 12 hours-patient was helped back to her bed by neighbors-since then she has been mostly bedbound/weak and frail-family subsequently called EMS and she was brought to the ED.  Upon further evaluation-she was found to have SNKNL-97 infection, complicated UTI-and admitted to the hospitalist service.  See below for further details.  COVID-19 vaccinated status: Unvaccinated (claims that she thinks vaccinated people get more Covid than the unvaccinated)  Significant Events: 12/2>> Admit to [facility] for QBHAL-93 infection, complicated UTI, AKI  Significant studies: 12/2>>Chest x-ray: No active disease 12/3>> x-ray pelvis: No acute abnormality noted  COVID-19 medications: Monoclonal antibody infusion: 12/3 x 1  Antibiotics: Rocephin: 12/2>>  Microbiology data: 12/2 >>blood culture: No growth 12/2>> urine culture: Pending  Procedures: None  Consults: None  DVT prophylaxis: enoxaparin (LOVENOX) injection 30 mg Start: 02/14/20 1015    Subjective:   Patient in bed although in no distress appears extremely fatigued, denies any headache chest or abdominal pain.   Assessment  & Plan :   Sepsis from complicated UTI  and XTKWI-09 infection: Continue supportive care-follow cultures.  Afebrile-sepsis pathophysiology slowly improving.  Pansensitive E. coli UTI: Continue Rocephin, will give 5 days.  COVID-19 infection: Despite chest x-ray being clear now CRP is creeping up and she is mildly short of breath, add 2 L oxygen and Decadron.  Monitor if worse will add Remdesivir as well.   Recent Labs  Lab 02/11/20 1552 02/11/20 1602 02/11/20 1921 02/11/20 2026 02/11/20 2219 02/12/20 0534 02/13/20 0352 02/14/20 0430 02/14/20 0453  WBC 8.7  --   --   --  8.2 9.6 11.7* 6.4  --   HGB 11.6*  --   --   --  11.9* 12.1 11.6* 10.9*  --   HCT 36.0  --   --   --  37.0 37.5 34.2* 34.2*  --   PLT 250  --   --   --  226 245 243 210  --   CRP  --   --   --   --  10.3* 11.5* 17.4* 11.9*  --   BNP  --   --   --   --   --   --  170.2*  --  141.3*  DDIMER  --   --   --   --  1.82* 2.24* 3.35* 3.32*  --   PROCALCITON  --   --   --   --   --   --  1.99 1.19  --   AST 58*  --   --   --   --  50* 56* 192*  --   ALT 37  --   --   --   --  33 33 126*  --   ALKPHOS 50  --   --   --   --  49 48 44  --   BILITOT 0.6  --   --   --   --  0.5 0.4 0.5  --   ALBUMIN 3.3*  --   --   --   --  2.8* 2.5* 2.3*  --   LATICACIDVEN  --  1.3  --  1.6  --   --   --   --   --   SARSCOV2NAA  --   --  POSITIVE*  --   --   --   --   --   --      Transaminitis - likely due to COVID-19, will check right upper quadrant ultrasound and monitor the trend.  Stop statins.  HLD: Statin on hold due to transaminitis.  AKI: With reported fall at home and prolonged downtime, mild rhabdo, continue hydration with IV fluids.  Holding ACE inhibitor and diuretics.  Will monitor bladder scans.  Elevated D-dimer.  Due to intense inflammation.  Placed on moderate dose Lovenox, check lower extremity ultrasound.  HTN: On Norvasc and beta-blocker.  ACE inhibitor held due to AKI.  As needed hydralazine added.  Severe debility/deconditioning: Secondary to COVID-19  infection-await PT/OT eval.  May need home health services or SNF.  Hypernatremia.  Gentle hydration and monitor.  May require D5W.  Urinary retention.  Foley and Flomax, Foley placed 02/13/2020.  Will monitor.    Condition - Extremely Guarded  Family Communication  :  Son [phone]) updated over the phone 02/13/20 - DNR, updated again on 02/14/20  Code Status : DNR  Diet :  Diet Order            Diet Heart Room service appropriate? Yes; Fluid consistency: Thin  Diet effective now                  Disposition Plan  :   Status is: Inpatient  Remains inpatient appropriate because:Inpatient level of care appropriate due to severity of illness   Dispo: The patient is from: Home              Anticipated d/c is to: TBD              Anticipated d/c date is: > 3 days  Patient currently is not medically stable to d/c.  Barriers to discharge: Complicated UTI requiring IV antibiotics-awaiting culture data, profound generalized debility/weakness-due to COVID-19 infection-needs PT eval to determine appropriate disposition.  Antimicorbials  :    Anti-infectives (From admission, onward)   Start     Dose/Rate Route Frequency Ordered Stop   02/13/20 1800  cefTRIAXone (ROCEPHIN) 1 g in sodium chloride 0.9 % 100 mL IVPB        1 g 200 mL/hr over 30 Minutes Intravenous Every 24 hours 02/13/20 0917 02/18/20 1759   02/13/20 1000  doxycycline (VIBRA-TABS) tablet 100 mg        100 mg Oral Every 12 hours 02/13/20 0917 02/18/20 0959   02/12/20 1900  cefTRIAXone (ROCEPHIN) 1 g in sodium chloride 0.9 % 100 mL IVPB  Status:  Discontinued        1 g 200 mL/hr over 30 Minutes Intravenous Every 24 hours 02/11/20 2117 02/13/20 0917   02/11/20 1830  cefTRIAXone (ROCEPHIN) 1 g in sodium chloride 0.9 % 100 mL IVPB        1 g 200 mL/hr over 30 Minutes Intravenous  Once 02/11/20 1818 02/11/20 2029      Inpatient Medications  Scheduled Meds: . amLODipine  10 mg Oral Daily  .  vitamin C  500 mg Oral Daily  . Chlorhexidine Gluconate Cloth  6 each Topical Daily  . dexamethasone (DECADRON) injection  6 mg Intravenous Q24H  . doxycycline  100 mg Oral Q12H  . enoxaparin (LOVENOX) injection  30 mg Subcutaneous Q12H  . metoprolol tartrate  50 mg Oral BID  . pantoprazole sodium  40 mg Oral Daily  . pravastatin  20 mg Oral q1800  . tamsulosin  0.4 mg Oral Daily  . zinc sulfate  220 mg Oral Daily   Continuous Infusions: . sodium chloride    . cefTRIAXone (ROCEPHIN)  IV 1 g (02/13/20 1737)  . lactated ringers with kcl     PRN Meds:.sodium chloride, acetaminophen, albuterol, hydrALAZINE   Time Spent in minutes  25  See all Orders from today for further details   Lala Lund M.D on 02/14/2020 at 10:04 AM  To page go to [email or web address] - use universal password  [facility] -  Office  [phone]    Objective:   Vitals:   02/13/20 1344 02/13/20 2155 02/14/20 0526 02/14/20 0808  BP: (!) 129/59 133/67 134/74 (!) 144/73  Pulse: 69 71 73 73  Resp: 20 18 19 16   Temp: 98 F (36.7 C) (!) 97.5 F (36.4 C) 97.7 F (36.5 C) 97.6 F (36.4 C)  TempSrc: Axillary Axillary Axillary Axillary  SpO2: 99% 100% 100% 99%  Weight:      Height:        Wt Readings from Last 3 Encounters:  02/11/20 53.1 kg  09/03/19 64 kg  04/25/16 64.6 kg     Intake/Output Summary (Last 24 hours) at 02/14/2020 1004 Last data filed at 02/14/2020 0902 Gross per 24 hour  Intake 1334.22 ml  Output 1550 ml  Net -215.78 ml     Physical Exam  Awake Alert, No new F.N deficits, appears fatigued [place].AT,PERRAL Supple Neck,No JVD, No cervical lymphadenopathy appriciated.  Symmetrical Chest wall movement, Good air movement bilaterally, CTAB RRR,No Gallops, Rubs or new Murmurs, No Parasternal Heave +ve B.Sounds, Abd Soft, No tenderness, No organomegaly appriciated, No rebound - guarding or rigidity. No Cyanosis, Clubbing or edema, No new Rash or bruise    Data Review:  CBC Recent Labs  Lab 02/11/20 1552 02/11/20 2219 02/12/20 0534 02/13/20 0352 02/14/20 0430  WBC 8.7 8.2 9.6 11.7* 6.4  HGB 11.6* 11.9* 12.1 11.6* 10.9*  HCT 36.0 37.0 37.5 34.2* 34.2*  PLT 250 226 245 243 210  MCV 90.5 89.8 90.1 86.6 89.8  MCH 29.1 28.9 29.1 29.4 28.6  MCHC 32.2 32.2 32.3 33.9 31.9  RDW 13.4 13.3 13.2 13.8 13.9  LYMPHSABS 0.6*  --  0.5* 0.6* 0.8  MONOABS 0.4  --  0.4 0.2 0.3  EOSABS 0.0  --  0.0 0.0 0.0  BASOSABS 0.0  --  0.0 0.0 0.0    Chemistries  Recent Labs  Lab 02/11/20 1552 02/11/20 2219 02/12/20 0534 02/13/20 0352 02/14/20 0430  NA 138  --  137 141 146*  K 3.8  --  3.3* 3.3* 3.4*  CL 101  --  103 111 114*  CO2 24  --  21* 19* 22  GLUCOSE 113*  --  137* 138* 126*  BUN 23  --  23 36* 37*  CREATININE 1.14* 0.88 0.98 1.48* 1.03*  CALCIUM 9.5  --  8.6* 8.2* 8.3*  MG  --   --   --  2.2 2.3  AST 58*  --  50* 56* 192*  ALT 37  --  33 33 126*  ALKPHOS 50  --  49 48 44  BILITOT 0.6  --  0.5 0.4 0.5   ------------------------------------------------------------------------------------------------------------------ No results for input(s): CHOL, HDL, LDLCALC, TRIG, CHOLHDL, LDLDIRECT in the last 72 hours.  No results found for: HGBA1C ------------------------------------------------------------------------------------------------------------------ Recent Labs    02/11/20 1553  TSH 0.783   ------------------------------------------------------------------------------------------------------------------ No results for input(s): VITAMINB12, FOLATE, FERRITIN, TIBC, IRON, RETICCTPCT in the last 72 hours.  Coagulation profile No results for input(s): INR, PROTIME in the last 168 hours.  Recent Labs    02/13/20 0352 02/14/20 0430  DDIMER 3.35* 3.32*    Cardiac Enzymes No results for input(s): CKMB, TROPONINI, MYOGLOBIN in the last 168 hours.  Invalid input(s):  CK ------------------------------------------------------------------------------------------------------------------    Component Value Date/Time   BNP 141.3 (H) 02/14/2020 0453    Micro Results Recent Results (from the past 240 hour(s))  Culture, blood (routine x 2)     Status: None (Preliminary result)   Collection Time: 02/11/20  4:02 PM   Specimen: BLOOD  Result Value Ref Range Status   Specimen Description BLOOD RIGHT ANTECUBITAL  Final   Special Requests   Final    BOTTLES DRAWN AEROBIC AND ANAEROBIC Blood Culture results may not be optimal due to an inadequate volume of blood received in culture bottles   Culture   Final    NO GROWTH 3 DAYS Performed at [facility], [place] [address] [place], [place] [address]    Report Status PENDING  Incomplete  Urine culture     Status: Abnormal   Collection Time: 02/11/20  4:52 PM   Specimen: Urine, Random  Result Value Ref Range Status   Specimen Description URINE, RANDOM  Final   Special Requests   Final    NONE Performed at [facility], [place] [address] [place], [place] [address]    Culture >=100,000 COLONIES/mL ESCHERICHIA COLI (A)  Final   Report Status 02/14/2020 FINAL  Final   Organism ID, Bacteria ESCHERICHIA COLI (A)  Final      Susceptibility   Escherichia coli - MIC*    AMPICILLIN >=32 RESISTANT Resistant     CEFAZOLIN 16 SENSITIVE Sensitive  CEFEPIME <=0.12 SENSITIVE Sensitive     CEFTRIAXONE <=0.25 SENSITIVE Sensitive     CIPROFLOXACIN <=0.25 SENSITIVE Sensitive     GENTAMICIN <=1 SENSITIVE Sensitive     IMIPENEM 0.5 SENSITIVE Sensitive     NITROFURANTOIN <=16 SENSITIVE Sensitive     TRIMETH/SULFA <=20 SENSITIVE Sensitive     AMPICILLIN/SULBACTAM >=32 RESISTANT Resistant     PIP/TAZO <=4 SENSITIVE Sensitive     * >=100,000 COLONIES/mL ESCHERICHIA COLI  Culture, blood (routine x 2)     Status: None (Preliminary result)   Collection Time: 02/11/20  5:00 PM   Specimen: BLOOD RIGHT ARM  Result Value  Ref Range Status   Specimen Description BLOOD RIGHT ARM  Final   Special Requests   Final    BOTTLES DRAWN AEROBIC ONLY Blood Culture results may not be optimal due to an inadequate volume of blood received in culture bottles   Culture   Final    NO GROWTH 3 DAYS Performed at [facility], [place] [address], [place], [place] [address]    Report Status PENDING  Incomplete  Resp Panel by RT-PCR (Flu A&B, Covid) Nasopharyngeal Swab     Status: Abnormal   Collection Time: 02/11/20  7:21 PM   Specimen: Nasopharyngeal Swab; Nasopharyngeal(NP) swabs in vial transport medium  Result Value Ref Range Status   SARS Coronavirus 2 by RT PCR POSITIVE (A) NEGATIVE Final    Comment: RESULT CALLED TO, READ BACK BY AND VERIFIED WITH: Teofilo Pod RN 02/11/20 AT 2158 SK (NOTE) SARS-CoV-2 target nucleic acids are DETECTED.  The SARS-CoV-2 RNA is generally detectable in upper respiratory specimens during the acute phase of infection. Positive results are indicative of the presence of the identified virus, but do not rule out bacterial infection or co-infection with other pathogens not detected by the test. Clinical correlation with patient history and other diagnostic information is necessary to determine patient infection status. The expected result is Negative.  Fact Sheet for Patients: EntrepreneurPulse.com.au  Fact Sheet for Healthcare Providers: IncredibleEmployment.be  This test is not yet approved or cleared by the [place] FDA and  has been authorized for detection and/or diagnosis of SARS-CoV-2 by FDA under an Emergency Use Authorization (EUA).  This EUA will remain in effect (meaning this test can be  used) for the duration of  the COVID-19 declaration under Section 564(b)(1) of the Act, 21 U.S.C. section 360bbb-3(b)(1), unless the authorization is terminated or revoked sooner.     Influenza A by PCR NEGATIVE NEGATIVE Final   Influenza B by PCR  NEGATIVE NEGATIVE Final    Comment: (NOTE) The Xpert Xpress SARS-CoV-2/FLU/RSV plus assay is intended as an aid in the diagnosis of influenza from Nasopharyngeal swab specimens and should not be used as a sole basis for treatment. Nasal washings and aspirates are unacceptable for Xpert Xpress SARS-CoV-2/FLU/RSV testing.  Fact Sheet for Patients: EntrepreneurPulse.com.au  Fact Sheet for Healthcare Providers: IncredibleEmployment.be  This test is not yet approved or cleared by the [place] FDA and has been authorized for detection and/or diagnosis of SARS-CoV-2 by FDA under an Emergency Use Authorization (EUA). This EUA will remain in effect (meaning this test can be used) for the duration of the COVID-19 declaration under Section 564(b)(1) of the Act, 21 U.S.C. section 360bbb-3(b)(1), unless the authorization is terminated or revoked.  Performed at [facility], [place] [address], [place], [place] [address]     Radiology Reports DG Pelvis 1-2 Views  Result Date: 02/12/2020 CLINICAL DATA:  Weakness  and foul-smelling urine EXAM: PELVIS - 1 VIEW COMPARISON:  09/03/2019 FINDINGS: There is no evidence of pelvic fracture or diastasis. No pelvic bone lesions are seen. IMPRESSION: No acute abnormality noted. Electronically Signed   By: Inez Catalina M.D.   On: 02/12/2020 02:13   CT HEAD WO CONTRAST  Result Date: 02/12/2020 CLINICAL DATA:  Weakness for 1 week EXAM: CT HEAD WITHOUT CONTRAST CT CERVICAL SPINE WITHOUT CONTRAST TECHNIQUE: Multidetector CT imaging of the head and cervical spine was performed following the standard protocol without intravenous contrast. Multiplanar CT image reconstructions of the cervical spine were also generated. COMPARISON:  None. FINDINGS: CT HEAD FINDINGS Brain: There is no mass, hemorrhage or extra-axial collection. The size and configuration of the ventricles and extra-axial CSF spaces are normal. There is  hypoattenuation of the periventricular white matter, most commonly indicating chronic ischemic microangiopathy. Vascular: No abnormal hyperdensity of the major intracranial arteries or dural venous sinuses. No intracranial atherosclerosis. Skull: The visualized skull base, calvarium and extracranial soft tissues are normal. Sinuses/Orbits: No fluid levels or advanced mucosal thickening of the visualized paranasal sinuses. No mastoid or middle ear effusion. The orbits are normal. CT CERVICAL SPINE FINDINGS Alignment: No static subluxation. Facets are aligned. Occipital condyles are normally positioned. Skull base and vertebrae: No acute fracture. Soft tissues and spinal canal: No prevertebral fluid or swelling. No visible canal hematoma. Disc levels: No advanced spinal canal or neural foraminal stenosis. Upper chest: No pneumothorax, pulmonary nodule or pleural effusion. Other: Calcific aortic atherosclerosis. IMPRESSION: 1. Chronic ischemic microangiopathy without acute intracranial abnormality. 2. No acute fracture or static subluxation of the cervical spine. Aortic Atherosclerosis (ICD10-I70.0). Electronically Signed   By: Ulyses Jarred M.D.   On: 02/12/2020 01:11   CT CERVICAL SPINE WO CONTRAST  Result Date: 02/12/2020 CLINICAL DATA:  Weakness for 1 week EXAM: CT HEAD WITHOUT CONTRAST CT CERVICAL SPINE WITHOUT CONTRAST TECHNIQUE: Multidetector CT imaging of the head and cervical spine was performed following the standard protocol without intravenous contrast. Multiplanar CT image reconstructions of the cervical spine were also generated. COMPARISON:  None. FINDINGS: CT HEAD FINDINGS Brain: There is no mass, hemorrhage or extra-axial collection. The size and configuration of the ventricles and extra-axial CSF spaces are normal. There is hypoattenuation of the periventricular white matter, most commonly indicating chronic ischemic microangiopathy. Vascular: No abnormal hyperdensity of the major intracranial  arteries or dural venous sinuses. No intracranial atherosclerosis. Skull: The visualized skull base, calvarium and extracranial soft tissues are normal. Sinuses/Orbits: No fluid levels or advanced mucosal thickening of the visualized paranasal sinuses. No mastoid or middle ear effusion. The orbits are normal. CT CERVICAL SPINE FINDINGS Alignment: No static subluxation. Facets are aligned. Occipital condyles are normally positioned. Skull base and vertebrae: No acute fracture. Soft tissues and spinal canal: No prevertebral fluid or swelling. No visible canal hematoma. Disc levels: No advanced spinal canal or neural foraminal stenosis. Upper chest: No pneumothorax, pulmonary nodule or pleural effusion. Other: Calcific aortic atherosclerosis. IMPRESSION: 1. Chronic ischemic microangiopathy without acute intracranial abnormality. 2. No acute fracture or static subluxation of the cervical spine. Aortic Atherosclerosis (ICD10-I70.0). Electronically Signed   By: Ulyses Jarred M.D.   On: 02/12/2020 01:11   US RENAL  Result Date: 02/13/2020 CLINICAL DATA:  Acute kidney injury. EXAM: RENAL / URINARY TRACT ULTRASOUND COMPLETE COMPARISON:  None. FINDINGS: Right Kidney: Renal measurements: 9.9 x 4.7 x 4.9 cm = volume: 118.5 mL. Mild renal cortical thinning. Mild to moderate hydronephrosis. No mass, no mass or stone. Normal parenchymal  echogenicity. Trace amount of nonspecific perinephric edema or fluid. Left Kidney: Renal measurements: 10.2 x 4.7 x 4.3 cm = volume: 111.4 mL. Renal cortical thinning. Slight dilation of the intrarenal collecting system. Normal parenchymal echogenicity. No mass or stone. Trace amount of perinephric fluid or edema. Bladder: Distended. Small amount of dependent debris. No mass or stone. Ureteral jets not visualized. Other: None. IMPRESSION: 1. Mild to moderate right and slight left renal collecting system dilation, suspected to be on the basis of bladder distension. No renal masses or stones. 2.  Renal cortical thinning, left greater than right. Electronically Signed   By: Lajean Manes M.D.   On: 02/13/2020 11:24   DG Chest Port 1 View  Result Date: 02/13/2020 CLINICAL DATA:  Shortness of breath, COVID-19 positivity EXAM: PORTABLE CHEST 1 VIEW COMPARISON:  02/11/2020 FINDINGS: Cardiac shadow is stable. Aortic calcifications are noted. The lungs are well aerated bilaterally. No focal infiltrate or sizable effusion is seen. No acute bony abnormality is seen. IMPRESSION: No acute abnormality noted. Electronically Signed   By: Inez Catalina M.D.   On: 02/13/2020 08:52   DG Chest Portable 1 View  Result Date: 02/11/2020 CLINICAL DATA:  Weakness. EXAM: PORTABLE CHEST 1 VIEW COMPARISON:  CT chest 12/0 scratch the CT chest 02/19/2019. FINDINGS: The lungs clear. Heart size normal. Aortic atherosclerosis. No pneumothorax or pleural fluid. IMPRESSION: No acute disease. Aortic Atherosclerosis (ICD10-I70.0). Electronically Signed   By: Inge Rise M.D.   On: 02/11/2020 17:46

## 2020-02-14 NOTE — Evaluation (Signed)
Physical Therapy Evaluation Patient Details Name: Sylvia Petersen MRN: [id number] DOB: 08/11/1929 Today's Date: 02/14/2020   History of Present Illness  Patient is a 86 y.o. female with PMHx of HLD, HTN-who has had progressive generalized weakness for approximately 1 week-apparently 4 prior to this hospitalization-patient sustained a mechanical fall-and was on the floor for almost 12 hours-patient was helped back to her bed by neighbors-since then she has been mostly bedbound/weak and frail-family subsequently called EMS and she was brought to the ED.  Clinical Impression   Pt admitted with above diagnosis. Comes from home where she lives alone in a doulble wide mobile home with a few steps to enter; Portal household distances, sponge bathes at baseline; Fearful of falling; Presents to PT with decr activity tolerance, dependencies in bed mobility and transfers, heavy posterior lean sitting EOB, making pivot transfers unsafe at this time;  Pt currently with functional limitations due to the deficits listed below (see PT Problem List). Pt will benefit from skilled PT to increase their independence and safety with mobility to allow discharge to the venue listed below.    BP seated EOB: 99/73 (SBP in the 140s earlier this AM prior to session start) Returned to supine: 138/77   Will consider getting proper orthostatics next session     Follow Up Recommendations SNF    Equipment Recommendations  Rolling walker with 5" wheels;3in1 (PT)    Recommendations for Other Services       Precautions / Restrictions Precautions Precautions: Fall Precaution Comments: watch for orthostatic hypotension Restrictions Weight Bearing Restrictions: No      Mobility  Bed Mobility Overal bed mobility: Needs Assistance Bed Mobility: Supine to Sit;Sit to Supine     Supine to sit: Total assist;+2 for physical assistance;+2 for safety/equipment Sit to supine: Total assist;+2 for physical assistance;+2 for  safety/equipment   General bed mobility comments: assist for LEs over EOB and trunk elevation; pt with strong posterior lean requiring heavy assist to maintain upright balance     Transfers                 General transfer comment: unable to safety attempt given pt's strong posterior bias sitting EOB and drop in BP   Ambulation/Gait                Stairs            Wheelchair Mobility    Modified Rankin (Stroke Patients Only)       Balance Overall balance assessment: Needs assistance Sitting-balance support: Feet supported Sitting balance-Leahy Scale: Zero Sitting balance - Comments: max-totalA, strong posterior push; reported nausea, and noted low BP compared to supine Postural control: Posterior lean                                   Pertinent Vitals/Pain Pain Assessment: No/denies pain    Home Living Family/patient expects to be discharged to:: Private residence Living Arrangements: Alone Available Help at Discharge: Friend(s) Type of Home: Mobile home Home Access: Stairs to enter Entrance Stairs-Rails: Psychiatric nurse of Steps: 4 Home Layout: One level Home Equipment: Environmental consultant - 2 wheels;Shower seat;Bedside commode      Prior Function Level of Independence: Independent with assistive device(s)         Comments: uses RW in the home, sponge bathes, has a friend who helps with groceries      Hand Dominance  Extremity/Trunk Assessment   Upper Extremity Assessment Upper Extremity Assessment: Defer to OT evaluation    Lower Extremity Assessment Lower Extremity Assessment: Generalized weakness (Reports her feet often feel tight)    Cervical / Trunk Assessment Cervical / Trunk Assessment: Kyphotic  Communication   Communication: HOH (slightly)  Cognition Arousal/Alertness: Awake/alert Behavior During Therapy: Flat affect Overall Cognitive Status: No family/caregiver present to determine  baseline cognitive functioning                                 General Comments: appears to be accurate historian when providing home setup/PLOF, not formally assessed today. following all commands and engaging appropriately      General Comments General comments (skin integrity, edema, etc.): pt with drop in BP seated EOB and onset of nausea (see clinical impression), HR and SpO2 stable on RA    Exercises     Assessment/Plan    PT Assessment Patient needs continued PT services  PT Problem List Decreased strength;Decreased range of motion;Decreased activity tolerance;Decreased balance;Decreased mobility;Decreased coordination;Decreased cognition;Decreased knowledge of use of DME;Decreased safety awareness;Decreased knowledge of precautions;Cardiopulmonary status limiting activity       PT Treatment Interventions DME instruction;Gait training;Functional mobility training;Therapeutic activities;Therapeutic exercise;Balance training;Cognitive remediation;Patient/family education    PT Goals (Current goals can be found in the Care Plan section)  Acute Rehab PT Goals Patient Stated Goal: "home" PT Goal Formulation: With patient Time For Goal Achievement: 02/28/20 Potential to Achieve Goals: Good    Frequency Min 2X/week   Barriers to discharge Decreased caregiver support      Co-evaluation PT/OT/SLP Co-Evaluation/Treatment: Yes Reason for Co-Treatment: Complexity of the patient's impairments (multi-system involvement);For patient/therapist safety;To address functional/ADL transfers PT goals addressed during session: Mobility/safety with mobility OT goals addressed during session: ADL's and self-care       AM-PAC PT "6 Clicks" Mobility  Outcome Measure Help needed turning from your back to your side while in a flat bed without using bedrails?: A Little Help needed moving from lying on your back to sitting on the side of a flat bed without using bedrails?:  Total Help needed moving to and from a bed to a chair (including a wheelchair)?: Total Help needed standing up from a chair using your arms (e.g., wheelchair or bedside chair)?: Total Help needed to walk in hospital room?: Total Help needed climbing 3-5 steps with a railing? : Total 6 Click Score: 8    End of Session   Activity Tolerance: Other (comment) (limtied by nausea and BP drop in sitting) Patient left: in bed;with call bell/phone within reach;with bed alarm set (bed in semi-chair position) Nurse Communication: Mobility status PT Visit Diagnosis: Unsteadiness on feet (R26.81);Other abnormalities of gait and mobility (R26.89);History of falling (Z91.81)    Time: 9758-8325 PT Time Calculation (min) (ACUTE ONLY): 36 min   Charges:   PT Evaluation $PT Eval Moderate Complexity: 1 Mod          Roney [place], [place]  Acute Rehabilitation Services Pager [phone] Office [phone]   Colletta [place] 02/14/2020, 2:46 PM

## 2020-02-14 NOTE — Evaluation (Signed)
Occupational Therapy Evaluation Patient Details Name: Sylvia Petersen MRN: [id number] DOB: [birth year] Today's Date: 02/14/2020    History of Present Illness Patient is a 86 y.o. female with PMHx of HLD, HTN-who has had progressive generalized weakness for approximately 1 week-apparently 4 prior to this hospitalization-patient sustained a mechanical fall-and was on the floor for almost 12 hours-patient was helped back to her bed by neighbors-since then she has been mostly bedbound/weak and frail-family subsequently called EMS and she was brought to the ED.   Clinical Impression   This 86 y/o female presents with the above. PTA pt reports living alone, using RW for mobility purposes and reports performing ADL without assist. Pt currently presenting with the above and below listed deficits including notable weakness, poor sitting balance and activity tolerance impacting her overall functional performance. Pt requiring heavy two person assist for completion of bed mobility. Once seated EOB pt with strong posterior push and requiring max-totalA to maintain static balance. Pt also with onset of nausea and noted drop in BP (see below) therefore returned to supine. Pt requiring up to Pax for LB ADL, modA for UB ADL. SpO2 >02% on RA throughout. She will benefit from continued acute OT services and currently recommend follow up therapy services in SNF setting after discharge to maximize her overall safety and independence with ADL and mobility.   BP seated EOB: 99/73 (SBP in the 140s earlier this AM prior to session start) Returned to supine: 138/77     Follow Up Recommendations  SNF    Equipment Recommendations  Wheelchair (measurements OT);Wheelchair cushion (measurements OT);Other (comment) (TBD)           Precautions / Restrictions Precautions Precautions: Fall Restrictions Weight Bearing Restrictions: No      Mobility Bed Mobility Overal bed mobility: Needs Assistance Bed Mobility:  Supine to Sit;Sit to Supine     Supine to sit: Total assist;+2 for physical assistance;+2 for safety/equipment Sit to supine: Total assist;+2 for physical assistance;+2 for safety/equipment   General bed mobility comments: assist for LEs over EOB and trunk elevation; pt with strong posterior lean requiring heavy assist to maintain upright balance     Transfers                 General transfer comment: unable to safety attempt given pt's strong posterior bias sitting EOB and drop in BP     Balance Overall balance assessment: Needs assistance Sitting-balance support: Feet supported Sitting balance-Leahy Scale: Zero Sitting balance - Comments: max-totalA, strong posterior push Postural control: Posterior lean                                 ADL either performed or assessed with clinical judgement   ADL Overall ADL's : Needs assistance/impaired Eating/Feeding: Set up;Sitting   Grooming: Minimal assistance;Bed level   Upper Body Bathing: Moderate assistance;Bed level   Lower Body Bathing: Maximal assistance;Bed level   Upper Body Dressing : Moderate assistance;Bed level   Lower Body Dressing: Total assistance;Bed level       Toileting- Clothing Manipulation and Hygiene: Total assistance;Bed level       Functional mobility during ADLs: +2 for physical assistance;+2 for safety/equipment;Total assistance (bed mobility only)       Vision         Perception     Praxis      Pertinent Vitals/Pain Pain Assessment: No/denies pain     Hand Dominance  Extremity/Trunk Assessment Upper Extremity Assessment Upper Extremity Assessment: Generalized weakness   Lower Extremity Assessment Lower Extremity Assessment: Defer to PT evaluation   Cervical / Trunk Assessment Cervical / Trunk Assessment: Kyphotic   Communication Communication Communication: HOH (slightly)   Cognition Arousal/Alertness: Awake/alert Behavior During Therapy: Flat  affect Overall Cognitive Status: No family/caregiver present to determine baseline cognitive functioning                                 General Comments: appears to be accurate historian when providing home setup/PLOF, not formally assessed today. following all commands and engaging appropriately   General Comments  pt with drop in BP seated EOB and onset of nausea (see clinical impression), HR and SpO2 stable on RA    Exercises     Shoulder Instructions      Home Living Family/patient expects to be discharged to:: Private residence Living Arrangements: Alone Available Help at Discharge: Friend(s) Type of Home: Mobile home Home Access: Stairs to enter [employer] of Steps: 4 Entrance Stairs-Rails: Right;Left Home Layout: One level     Bathroom Shower/Tub:  (pt sponge bathes)   Bathroom Toilet: Standard     Home Equipment: Walker - 2 wheels;Shower seat;Bedside commode          Prior Functioning/Environment Level of Independence: Independent with assistive device(s)        Comments: uses RW in the home, sponge bathes, has a friend who helps with groceries         OT Problem List: Decreased strength;Decreased range of motion;Decreased activity tolerance;Impaired balance (sitting and/or standing);Decreased knowledge of use of DME or AE;Decreased knowledge of precautions;Cardiopulmonary status limiting activity      OT Treatment/Interventions: Self-care/ADL training;Therapeutic exercise;Energy conservation;DME and/or AE instruction;Therapeutic activities;Cognitive remediation/compensation;Patient/family education;Balance training    OT Goals(Current goals can be found in the care plan section) Acute Rehab OT Goals Patient Stated Goal: "home" OT Goal Formulation: With patient Time For Goal Achievement: 02/28/20 Potential to Achieve Goals: Fair  OT Frequency: Min 2X/week   Barriers to D/C:            Co-evaluation PT/OT/SLP  Co-Evaluation/Treatment: Yes Reason for Co-Treatment: Complexity of the patient's impairments (multi-system involvement);For patient/therapist safety;To address functional/ADL transfers   OT goals addressed during session: ADL's and self-care      AM-PAC OT "6 Clicks" Daily Activity     Outcome Measure Help from another person eating meals?: A Little Help from another person taking care of personal grooming?: A Lot Help from another person toileting, which includes using toliet, bedpan, or urinal?: Total Help from another person bathing (including washing, rinsing, drying)?: A Lot Help from another person to put on and taking off regular upper body clothing?: A Lot Help from another person to put on and taking off regular lower body clothing?: Total 6 Click Score: 11   End of Session Nurse Communication: Mobility status  Activity Tolerance: Patient tolerated treatment well Patient left: in bed;with call bell/phone within reach;with bed alarm set  OT Visit Diagnosis: Muscle weakness (generalized) (M62.81);Other abnormalities of gait and mobility (R26.89);History of falling (Z91.81)                Time: 2703-5009 OT Time Calculation (min): 26 min Charges:  OT General Charges $OT Visit: 1 Visit OT Evaluation $OT Eval Moderate Complexity: 1 Mod  Lou Cal, OT Acute Rehabilitation Services Pager [phone] Office [phone]   Raymondo Band 02/14/2020, 2:25  PM

## 2020-02-15 ENCOUNTER — Inpatient Hospital Stay (HOSPITAL_COMMUNITY): Payer: PPO

## 2020-02-15 DIAGNOSIS — M7989 Other specified soft tissue disorders: Secondary | ICD-10-CM

## 2020-02-15 LAB — COMPREHENSIVE METABOLIC PANEL
ALT: 242 U/L — ABNORMAL HIGH (ref 0–44)
AST: 270 U/L — ABNORMAL HIGH (ref 15–41)
Albumin: 2.4 g/dL — ABNORMAL LOW (ref 3.5–5.0)
Alkaline Phosphatase: 47 U/L (ref 38–126)
Anion gap: 9 (ref 5–15)
BUN: 35 mg/dL — ABNORMAL HIGH (ref 8–23)
CO2: 20 mmol/L — ABNORMAL LOW (ref 22–32)
Calcium: 8.6 mg/dL — ABNORMAL LOW (ref 8.9–10.3)
Chloride: 109 mmol/L (ref 98–111)
Creatinine, Ser: 0.82 mg/dL (ref 0.44–1.00)
GFR, Estimated: >60 mL/min (ref >60)
Glucose, Bld: 120 mg/dL — ABNORMAL HIGH (ref 70–99)
Potassium: 4 mmol/L (ref 3.5–5.1)
Sodium: 138 mmol/L (ref 135–145)
Total Bilirubin: 0.5 mg/dL (ref 0.3–1.2)
Total Protein: 5.7 g/dL — ABNORMAL LOW (ref 6.5–8.1)

## 2020-02-15 LAB — CBC WITH DIFFERENTIAL/PLATELET
Abs Immature Granulocytes: 0.07 10*3/uL (ref 0.00–0.07)
Basophils Absolute: 0 10*3/uL (ref 0.0–0.1)
Basophils Relative: 0 %
Eosinophils Absolute: 0 10*3/uL (ref 0.0–0.5)
Eosinophils Relative: 0 %
HCT: 35.4 % — ABNORMAL LOW (ref 36.0–46.0)
Hemoglobin: 11.4 g/dL — ABNORMAL LOW (ref 12.0–15.0)
Immature Granulocytes: 1 %
Lymphocytes Relative: 11 %
Lymphs Abs: 0.8 10*3/uL (ref 0.7–4.0)
MCH: 28.6 pg (ref 26.0–34.0)
MCHC: 32.2 g/dL (ref 30.0–36.0)
MCV: 88.7 fL (ref 80.0–100.0)
Monocytes Absolute: 0.3 10*3/uL (ref 0.1–1.0)
Monocytes Relative: 4 %
Neutro Abs: 6.3 10*3/uL (ref 1.7–7.7)
Neutrophils Relative %: 84 %
Platelets: 249 10*3/uL (ref 150–400)
RBC: 3.99 MIL/uL (ref 3.87–5.11)
RDW: 13.5 % (ref 11.5–15.5)
WBC: 7.5 10*3/uL (ref 4.0–10.5)
nRBC: 0 % (ref 0.0–0.2)

## 2020-02-15 LAB — HEPATITIS PANEL, ACUTE
HCV Ab: NONREACTIVE
Hep A IgM: NONREACTIVE
Hep B C IgM: NONREACTIVE
Hepatitis B Surface Ag: NONREACTIVE

## 2020-02-15 LAB — D-DIMER, QUANTITATIVE: D-Dimer, Quant: 2.26 ug/mL-FEU — ABNORMAL HIGH (ref 0.00–0.50)

## 2020-02-15 LAB — BRAIN NATRIURETIC PEPTIDE: B Natriuretic Peptide: 159.6 pg/mL — ABNORMAL HIGH (ref 0.0–100.0)

## 2020-02-15 LAB — MAGNESIUM: Magnesium: 2.1 mg/dL (ref 1.7–2.4)

## 2020-02-15 LAB — C-REACTIVE PROTEIN: CRP: 5.4 mg/dL — ABNORMAL HIGH (ref <1.0)

## 2020-02-15 LAB — PROTIME-INR
INR: 1.1 (ref 0.8–1.2)
Prothrombin Time: 13.4 seconds (ref 11.4–15.2)

## 2020-02-15 LAB — PROCALCITONIN: Procalcitonin: 0.55 ng/mL

## 2020-02-15 NOTE — Progress Notes (Signed)
Bilateral lower extremity venous duplex has been completed. Preliminary results can be found in CV Proc through chart review.   02/15/20 2:44 PM Sylvia Petersen RVT

## 2020-02-15 NOTE — Progress Notes (Addendum)
PROGRESS NOTE                                                                                                                                                                                                             Patient Demographics:    Sylvia Petersen, is a 86 y.o. female, DOB - [birth year], GLO:[id number]  Outpatient Primary MD for the patient is [name], MD   Admit date - 02/11/2020   LOS - 4  Chief Complaint  Patient presents with  . Code Sepsis       Brief Narrative: Patient is a 86 y.o. female with PMHx of HLD, HTN-who has had progressive generalized weakness for approximately 1 week-apparently 4 prior to this hospitalization-patient sustained a mechanical fall-and was on the floor for almost 12 hours-patient was helped back to her bed by neighbors-since then she has been mostly bedbound/weak and frail-family subsequently called EMS and she was brought to the ED.  Upon further evaluation-she was found to have JOACZ-66 infection, complicated UTI-and admitted to the hospitalist service.  See below for further details.  COVID-19 vaccinated status: Unvaccinated (claims that she thinks vaccinated people get more Covid than the unvaccinated)  Significant Events: 12/2>> Admit to [facility] for AYTKZ-60 infection, complicated UTI, AKI  Significant studies: 12/2>>Chest x-ray: No active disease 12/3>> x-ray pelvis: No acute abnormality noted  COVID-19 medications: Monoclonal antibody infusion: 12/3 x 1  Antibiotics: Rocephin: 12/2>>  Microbiology data: 12/2 >>blood culture: No growth 12/2>> urine culture: Pending  Procedures: None  Consults: None  DVT prophylaxis: enoxaparin (LOVENOX) injection 40 mg Start: 02/14/20 2200    Subjective:   Patient in bed although in no distress appears extremely fatigued, denies any headache chest or abdominal pain.   Assessment  & Plan :   Sepsis from complicated UTI  and FUXNA-35 infection: Continue supportive care-follow cultures.  Afebrile-sepsis pathophysiology has completely resolved.  Pansensitive E. coli UTI: Continue Rocephin, will give 5 days.  COVID-19 infection: Despite chest x-ray being clear now CRP is creeping up and she is mildly short of breath, add 2 L oxygen and Decadron. CRP is trending down and she is now feeling much better. No need to escalate treatment. We will continue steroids only for now.   Recent Labs  Lab 02/11/20 1552 02/11/20 1552 02/11/20 1602 02/11/20 1921 02/11/20 2026 02/11/20 2219 02/12/20 0534  02/13/20 0352 02/14/20 0430 02/14/20 0453 02/15/20 0409  WBC 8.7   < >  --   --   --  8.2 9.6 11.7* 6.4  --  7.5  HGB 11.6*   < >  --   --   --  11.9* 12.1 11.6* 10.9*  --  11.4*  HCT 36.0   < >  --   --   --  37.0 37.5 34.2* 34.2*  --  35.4*  PLT 250   < >  --   --   --  226 245 243 210  --  249  CRP  --   --   --   --   --  10.3* 11.5* 17.4* 11.9*  --  5.4*  BNP  --   --   --   --   --   --   --  170.2*  --  141.3* 159.6*  DDIMER  --   --   --   --   --  1.82* 2.24* 3.35* 3.32*  --  2.26*  PROCALCITON  --   --   --   --   --   --   --  1.99 1.19  --  0.55  AST 58*  --   --   --   --   --  50* 56* 192*  --  270*  ALT 37  --   --   --   --   --  33 33 126*  --  242*  ALKPHOS 50  --   --   --   --   --  49 48 44  --  47  BILITOT 0.6  --   --   --   --   --  0.5 0.4 0.5  --  0.5  ALBUMIN 3.3*  --   --   --   --   --  2.8* 2.5* 2.3*  --  2.4*  LATICACIDVEN  --   --  1.3  --  1.6  --   --   --   --   --   --   SARSCOV2NAA  --   --   --  POSITIVE*  --   --   --   --   --   --   --    < > = values in this interval not displayed.     Transaminitis - likely due to COVID-19, happens on hold, right upper quadrant ultrasound is nonacute, will monitor LFTs, check INR and acute hepatitis panel also.  HLD: Statin on hold due to transaminitis.  AKI: With reported fall at home and prolonged downtime, mild rhabdo, continue  hydration with IV fluids.  Holding ACE inhibitor and diuretics.     Urinary retention.  Foley and Flomax, Foley placed 02/13/2020.  Will monitor.  Elevated D-dimer.  Due to intense inflammation.  Placed on moderate dose Lovenox, check lower extremity ultrasound.  HTN: On Norvasc and beta-blocker.  ACE inhibitor held due to AKI.  As needed hydralazine added.  Severe debility/deconditioning: Secondary to COVID-19 infection-await PT/OT eval.  May need home health services or SNF.  Hypernatremia. Resolved after hydration with D5W.     Condition - Extremely Guarded  Family Communication  :  Son [phone]) updated over the phone 02/13/20 - DNR, updated again on 02/14/20  Code Status : DNR  Diet :  Diet Order            Diet Heart  Room service appropriate? Yes; Fluid consistency: Thin  Diet effective now                  Disposition Plan  :   Status is: Inpatient  Remains inpatient appropriate because:Inpatient level of care appropriate due to severity of illness   Dispo: The patient is from: Home              Anticipated d/c is to: TBD              Anticipated d/c date is: > 3 days              Patient currently is not medically stable to d/c.  Barriers to discharge: Complicated UTI requiring IV antibiotics-awaiting culture data, profound generalized debility/weakness-due to COVID-19 infection-needs PT eval to determine appropriate disposition.  Antimicorbials  :    Anti-infectives (From admission, onward)   Start     Dose/Rate Route Frequency Ordered Stop   02/13/20 1800  cefTRIAXone (ROCEPHIN) 1 g in sodium chloride 0.9 % 100 mL IVPB        1 g 200 mL/hr over 30 Minutes Intravenous Every 24 hours 02/13/20 0917 02/19/20 1759   02/13/20 1000  doxycycline (VIBRA-TABS) tablet 100 mg        100 mg Oral Every 12 hours 02/13/20 0917 02/18/20 0959   02/12/20 1900  cefTRIAXone (ROCEPHIN) 1 g in sodium chloride 0.9 % 100 mL IVPB  Status:  Discontinued        1 g 200  mL/hr over 30 Minutes Intravenous Every 24 hours 02/11/20 2117 02/13/20 0917   02/11/20 1830  cefTRIAXone (ROCEPHIN) 1 g in sodium chloride 0.9 % 100 mL IVPB        1 g 200 mL/hr over 30 Minutes Intravenous  Once 02/11/20 1818 02/11/20 2029      Inpatient Medications  Scheduled Meds: . amLODipine  10 mg Oral Daily  . vitamin C  500 mg Oral Daily  . Chlorhexidine Gluconate Cloth  6 each Topical Daily  . dexamethasone (DECADRON) injection  6 mg Intravenous Q24H  . doxycycline  100 mg Oral Q12H  . enoxaparin (LOVENOX) injection  40 mg Subcutaneous Q24H  . metoprolol tartrate  50 mg Oral BID  . pantoprazole sodium  40 mg Oral Daily  . tamsulosin  0.4 mg Oral Daily  . zinc sulfate  220 mg Oral Daily   Continuous Infusions: . sodium chloride    . cefTRIAXone (ROCEPHIN)  IV 1 g (02/14/20 1749)   PRN Meds:.sodium chloride, acetaminophen, albuterol, hydrALAZINE   Time Spent in minutes  25  See all Orders from today for further details   Lala Lund M.D on 02/15/2020 at 9:39 AM  To page go to [email or web address] - use universal password  [facility] -  Office  [phone]    Objective:   Vitals:   02/14/20 0808 02/14/20 1500 02/14/20 2055 02/15/20 0455  BP: (!) 144/73 124/60 127/64 (!) 141/80  Pulse: 73 65 74 78  Resp: 16 14 19 18   Temp: 97.6 F (36.4 C) 97.6 F (36.4 C) 98.1 F (36.7 C) 97.6 F (36.4 C)  TempSrc: Axillary Axillary Axillary Oral  SpO2: 99% 96% 100% 100%  Weight:      Height:        Wt Readings from Last 3 Encounters:  02/11/20 53.1 kg  09/03/19 64 kg  04/25/16 64.6 kg     Intake/Output Summary (Last 24 hours) at 02/15/2020 4580 Last data filed at 02/15/2020  0918 Gross per 24 hour  Intake 1377.33 ml  Output 1300 ml  Net 77.33 ml     Physical Exam  Awake Alert, No new F.N deficits, Foley in place [place].AT,PERRAL Supple Neck,No JVD, No cervical lymphadenopathy appriciated.  Symmetrical Chest wall movement, Good air movement bilaterally,  CTAB RRR,No Gallops, Rubs or new Murmurs, No Parasternal Heave +ve B.Sounds, Abd Soft, No tenderness, No organomegaly appriciated, No rebound - guarding or rigidity. No Cyanosis, Clubbing or edema, No new Rash or bruise     Data Review:    CBC Recent Labs  Lab 02/11/20 1552 02/11/20 1552 02/11/20 2219 02/12/20 0534 02/13/20 0352 02/14/20 0430 02/15/20 0409  WBC 8.7   < > 8.2 9.6 11.7* 6.4 7.5  HGB 11.6*   < > 11.9* 12.1 11.6* 10.9* 11.4*  HCT 36.0   < > 37.0 37.5 34.2* 34.2* 35.4*  PLT 250   < > 226 245 243 210 249  MCV 90.5   < > 89.8 90.1 86.6 89.8 88.7  MCH 29.1   < > 28.9 29.1 29.4 28.6 28.6  MCHC 32.2   < > 32.2 32.3 33.9 31.9 32.2  RDW 13.4   < > 13.3 13.2 13.8 13.9 13.5  LYMPHSABS 0.6*  --   --  0.5* 0.6* 0.8 0.8  MONOABS 0.4  --   --  0.4 0.2 0.3 0.3  EOSABS 0.0  --   --  0.0 0.0 0.0 0.0  BASOSABS 0.0  --   --  0.0 0.0 0.0 0.0   < > = values in this interval not displayed.    Chemistries  Recent Labs  Lab 02/11/20 1552 02/11/20 1552 02/11/20 2219 02/12/20 0534 02/13/20 0352 02/14/20 0430 02/15/20 0409  NA 138  --   --  137 141 146* 138  K 3.8  --   --  3.3* 3.3* 3.4* 4.0  CL 101  --   --  103 111 114* 109  CO2 24  --   --  21* 19* 22 20*  GLUCOSE 113*  --   --  137* 138* 126* 120*  BUN 23  --   --  23 36* 37* 35*  CREATININE 1.14*   < > 0.88 0.98 1.48* 1.03* 0.82  CALCIUM 9.5  --   --  8.6* 8.2* 8.3* 8.6*  MG  --   --   --   --  2.2 2.3 2.1  AST 58*  --   --  50* 56* 192* 270*  ALT 37  --   --  33 33 126* 242*  ALKPHOS 50  --   --  49 48 44 47  BILITOT 0.6  --   --  0.5 0.4 0.5 0.5   < > = values in this interval not displayed.   ------------------------------------------------------------------------------------------------------------------ No results for input(s): CHOL, HDL, LDLCALC, TRIG, CHOLHDL, LDLDIRECT in the last 72 hours.  No results found for:  HGBA1C ------------------------------------------------------------------------------------------------------------------ No results for input(s): TSH, T4TOTAL, T3FREE, THYROIDAB in the last 72 hours.  Invalid input(s): FREET3 ------------------------------------------------------------------------------------------------------------------ No results for input(s): VITAMINB12, FOLATE, FERRITIN, TIBC, IRON, RETICCTPCT in the last 72 hours.  Coagulation profile No results for input(s): INR, PROTIME in the last 168 hours.  Recent Labs    02/14/20 0430 02/15/20 0409  DDIMER 3.32* 2.26*    Cardiac Enzymes No results for input(s): CKMB, TROPONINI, MYOGLOBIN in the last 168 hours.  Invalid input(s): CK ------------------------------------------------------------------------------------------------------------------    Component Value Date/Time   BNP 159.6 (H) 02/15/2020  0409    Micro Results Recent Results (from the past 240 hour(s))  Culture, blood (routine x 2)     Status: None (Preliminary result)   Collection Time: 02/11/20  4:02 PM   Specimen: BLOOD  Result Value Ref Range Status   Specimen Description BLOOD RIGHT ANTECUBITAL  Final   Special Requests   Final    BOTTLES DRAWN AEROBIC AND ANAEROBIC Blood Culture results may not be optimal due to an inadequate volume of blood received in culture bottles   Culture   Final    NO GROWTH 4 DAYS Performed at [facility], [place] [address], [place], [place] [address]    Report Status PENDING  Incomplete  Urine culture     Status: Abnormal   Collection Time: 02/11/20  4:52 PM   Specimen: Urine, Random  Result Value Ref Range Status   Specimen Description URINE, RANDOM  Final   Special Requests   Final    NONE Performed at [facility], [place] [address], [place], [place] [address]    Culture >=100,000 COLONIES/mL ESCHERICHIA COLI (A)  Final   Report Status 02/14/2020 FINAL  Final   Organism ID, Bacteria ESCHERICHIA  COLI (A)  Final      Susceptibility   Escherichia coli - MIC*    AMPICILLIN >=32 RESISTANT Resistant     CEFAZOLIN 16 SENSITIVE Sensitive     CEFEPIME <=0.12 SENSITIVE Sensitive     CEFTRIAXONE <=0.25 SENSITIVE Sensitive     CIPROFLOXACIN <=0.25 SENSITIVE Sensitive     GENTAMICIN <=1 SENSITIVE Sensitive     IMIPENEM 0.5 SENSITIVE Sensitive     NITROFURANTOIN <=16 SENSITIVE Sensitive     TRIMETH/SULFA <=20 SENSITIVE Sensitive     AMPICILLIN/SULBACTAM >=32 RESISTANT Resistant     PIP/TAZO <=4 SENSITIVE Sensitive     * >=100,000 COLONIES/mL ESCHERICHIA COLI  Culture, blood (routine x 2)     Status: None (Preliminary result)   Collection Time: 02/11/20  5:00 PM   Specimen: BLOOD RIGHT ARM  Result Value Ref Range Status   Specimen Description BLOOD RIGHT ARM  Final   Special Requests   Final    BOTTLES DRAWN AEROBIC ONLY Blood Culture results may not be optimal due to an inadequate volume of blood received in culture bottles   Culture   Final    NO GROWTH 4 DAYS Performed at [facility], [address] [place], [place] [address]    Report Status PENDING  Incomplete  Resp Panel by RT-PCR (Flu A&B, Covid) Nasopharyngeal Swab     Status: Abnormal   Collection Time: 02/11/20  7:21 PM   Specimen: Nasopharyngeal Swab; Nasopharyngeal(NP) swabs in vial transport medium  Result Value Ref Range Status   SARS Coronavirus 2 by RT PCR POSITIVE (A) NEGATIVE Final    Comment: RESULT CALLED TO, READ BACK BY AND VERIFIED WITH: Teofilo Pod RN 02/11/20 AT 2158 SK (NOTE) SARS-CoV-2 target nucleic acids are DETECTED.  The SARS-CoV-2 RNA is generally detectable in upper respiratory specimens during the acute phase of infection. Positive results are indicative of the presence of the identified virus, but do not rule out bacterial infection or co-infection with other pathogens not detected by the test. Clinical correlation with patient history and other diagnostic information is necessary to  determine patient infection status. The expected result is Negative.  Fact Sheet for Patients: EntrepreneurPulse.com.au  Fact Sheet for Healthcare Providers: IncredibleEmployment.be  This test is not yet approved or cleared by the [place]  States FDA and  has been authorized for detection and/or diagnosis of SARS-CoV-2 by FDA under an Emergency Use Authorization (EUA).  This EUA will remain in effect (meaning this test can be  used) for the duration of  the COVID-19 declaration under Section 564(b)(1) of the Act, 21 U.S.C. section 360bbb-3(b)(1), unless the authorization is terminated or revoked sooner.     Influenza A by PCR NEGATIVE NEGATIVE Final   Influenza B by PCR NEGATIVE NEGATIVE Final    Comment: (NOTE) The Xpert Xpress SARS-CoV-2/FLU/RSV plus assay is intended as an aid in the diagnosis of influenza from Nasopharyngeal swab specimens and should not be used as a sole basis for treatment. Nasal washings and aspirates are unacceptable for Xpert Xpress SARS-CoV-2/FLU/RSV testing.  Fact Sheet for Patients: EntrepreneurPulse.com.au  Fact Sheet for Healthcare Providers: IncredibleEmployment.be  This test is not yet approved or cleared by the [place] FDA and has been authorized for detection and/or diagnosis of SARS-CoV-2 by FDA under an Emergency Use Authorization (EUA). This EUA will remain in effect (meaning this test can be used) for the duration of the COVID-19 declaration under Section 564(b)(1) of the Act, 21 U.S.C. section 360bbb-3(b)(1), unless the authorization is terminated or revoked.  Performed at [facility], [place] [address] [place],  [address]     Radiology Reports DG Pelvis 1-2 Views  Result Date: 02/12/2020 CLINICAL DATA:  Weakness and foul-smelling urine EXAM: PELVIS - 1 VIEW COMPARISON:  09/03/2019 FINDINGS: There is no evidence of pelvic fracture or diastasis.  No pelvic bone lesions are seen. IMPRESSION: No acute abnormality noted. Electronically Signed   By: Inez Catalina M.D.   On: 02/12/2020 02:13   CT HEAD WO CONTRAST  Result Date: 02/12/2020 CLINICAL DATA:  Weakness for 1 week EXAM: CT HEAD WITHOUT CONTRAST CT CERVICAL SPINE WITHOUT CONTRAST TECHNIQUE: Multidetector CT imaging of the head and cervical spine was performed following the standard protocol without intravenous contrast. Multiplanar CT image reconstructions of the cervical spine were also generated. COMPARISON:  None. FINDINGS: CT HEAD FINDINGS Brain: There is no mass, hemorrhage or extra-axial collection. The size and configuration of the ventricles and extra-axial CSF spaces are normal. There is hypoattenuation of the periventricular white matter, most commonly indicating chronic ischemic microangiopathy. Vascular: No abnormal hyperdensity of the major intracranial arteries or dural venous sinuses. No intracranial atherosclerosis. Skull: The visualized skull base, calvarium and extracranial soft tissues are normal. Sinuses/Orbits: No fluid levels or advanced mucosal thickening of the visualized paranasal sinuses. No mastoid or middle ear effusion. The orbits are normal. CT CERVICAL SPINE FINDINGS Alignment: No static subluxation. Facets are aligned. Occipital condyles are normally positioned. Skull base and vertebrae: No acute fracture. Soft tissues and spinal canal: No prevertebral fluid or swelling. No visible canal hematoma. Disc levels: No advanced spinal canal or neural foraminal stenosis. Upper chest: No pneumothorax, pulmonary nodule or pleural effusion. Other: Calcific aortic atherosclerosis. IMPRESSION: 1. Chronic ischemic microangiopathy without acute intracranial abnormality. 2. No acute fracture or static subluxation of the cervical spine. Aortic Atherosclerosis (ICD10-I70.0). Electronically Signed   By: Ulyses Jarred M.D.   On: 02/12/2020 01:11   CT CERVICAL SPINE WO CONTRAST  Result  Date: 02/12/2020 CLINICAL DATA:  Weakness for 1 week EXAM: CT HEAD WITHOUT CONTRAST CT CERVICAL SPINE WITHOUT CONTRAST TECHNIQUE: Multidetector CT imaging of the head and cervical spine was performed following the standard protocol without intravenous contrast. Multiplanar CT image reconstructions of the cervical spine were also generated. COMPARISON:  None. FINDINGS: CT  HEAD FINDINGS Brain: There is no mass, hemorrhage or extra-axial collection. The size and configuration of the ventricles and extra-axial CSF spaces are normal. There is hypoattenuation of the periventricular white matter, most commonly indicating chronic ischemic microangiopathy. Vascular: No abnormal hyperdensity of the major intracranial arteries or dural venous sinuses. No intracranial atherosclerosis. Skull: The visualized skull base, calvarium and extracranial soft tissues are normal. Sinuses/Orbits: No fluid levels or advanced mucosal thickening of the visualized paranasal sinuses. No mastoid or middle ear effusion. The orbits are normal. CT CERVICAL SPINE FINDINGS Alignment: No static subluxation. Facets are aligned. Occipital condyles are normally positioned. Skull base and vertebrae: No acute fracture. Soft tissues and spinal canal: No prevertebral fluid or swelling. No visible canal hematoma. Disc levels: No advanced spinal canal or neural foraminal stenosis. Upper chest: No pneumothorax, pulmonary nodule or pleural effusion. Other: Calcific aortic atherosclerosis. IMPRESSION: 1. Chronic ischemic microangiopathy without acute intracranial abnormality. 2. No acute fracture or static subluxation of the cervical spine. Aortic Atherosclerosis (ICD10-I70.0). Electronically Signed   By: Ulyses Jarred M.D.   On: 02/12/2020 01:11   US RENAL  Result Date: 02/13/2020 CLINICAL DATA:  Acute kidney injury. EXAM: RENAL / URINARY TRACT ULTRASOUND COMPLETE COMPARISON:  None. FINDINGS: Right Kidney: Renal measurements: 9.9 x 4.7 x 4.9 cm = volume:  118.5 mL. Mild renal cortical thinning. Mild to moderate hydronephrosis. No mass, no mass or stone. Normal parenchymal echogenicity. Trace amount of nonspecific perinephric edema or fluid. Left Kidney: Renal measurements: 10.2 x 4.7 x 4.3 cm = volume: 111.4 mL. Renal cortical thinning. Slight dilation of the intrarenal collecting system. Normal parenchymal echogenicity. No mass or stone. Trace amount of perinephric fluid or edema. Bladder: Distended. Small amount of dependent debris. No mass or stone. Ureteral jets not visualized. Other: None. IMPRESSION: 1. Mild to moderate right and slight left renal collecting system dilation, suspected to be on the basis of bladder distension. No renal masses or stones. 2. Renal cortical thinning, left greater than right. Electronically Signed   By: Lajean Manes M.D.   On: 02/13/2020 11:24   DG Chest Port 1 View  Result Date: 02/13/2020 CLINICAL DATA:  Shortness of breath, COVID-19 positivity EXAM: PORTABLE CHEST 1 VIEW COMPARISON:  02/11/2020 FINDINGS: Cardiac shadow is stable. Aortic calcifications are noted. The lungs are well aerated bilaterally. No focal infiltrate or sizable effusion is seen. No acute bony abnormality is seen. IMPRESSION: No acute abnormality noted. Electronically Signed   By: Inez Catalina M.D.   On: 02/13/2020 08:52   DG Chest Portable 1 View  Result Date: 02/11/2020 CLINICAL DATA:  Weakness. EXAM: PORTABLE CHEST 1 VIEW COMPARISON:  CT chest 12/0 scratch the CT chest 02/19/2019. FINDINGS: The lungs clear. Heart size normal. Aortic atherosclerosis. No pneumothorax or pleural fluid. IMPRESSION: No acute disease. Aortic Atherosclerosis (ICD10-I70.0). Electronically Signed   By: Inge Rise M.D.   On: 02/11/2020 17:46   US Abdomen Limited RUQ (LIVER/GB)  Result Date: 02/14/2020 CLINICAL DATA:  Transaminitis EXAM: ULTRASOUND ABDOMEN LIMITED RIGHT UPPER QUADRANT COMPARISON:  None. FINDINGS: Gallbladder: No gallstones or wall thickening  visualized. No sonographic Murphy sign noted by sonographer. Common bile duct: Diameter: 6 mm Liver: No focal lesion identified. Within normal limits in parenchymal echogenicity. Portal vein is patent on color Doppler imaging with normal direction of blood flow towards the liver. Other: Incidentally noted is a right-sided pleural effusion. IMPRESSION: 1. No specific abnormality to explain the patient's transaminitis. 2. Incidentally noted right-sided pleural effusion. Electronically Signed   By: Harrell Gave  Green M.D.   On: 02/14/2020 19:05

## 2020-02-15 NOTE — [place] FL2 (Signed)
[place] MEDICAID FL2 LEVEL OF CARE SCREENING TOOL     IDENTIFICATION  Patient Name: Sylvia Petersen Birthdate: 08-17-1929 Sex: female Admission Date (Current Location): 02/11/2020  [facility] and [place] Number:  Herbalist and Address:  [facility] [place] [address] [place], [place] [address]      Provider Number: [id number]  Attending Physician Name and Address:  Thurnell Lose, MD  Relative Name and Phone Number:  [name], son, [phone]    Current Level of Care: Hospital Recommended Level of Care: [place] Prior Approval Number:    Date Approved/Denied:   PASRR Number: [id number]  Discharge Plan: SNF    Current Diagnoses: Patient Active Problem List   Diagnosis Date Noted  . SIRS (systemic inflammatory response syndrome) ([place]) 02/12/2020  . ARF (acute renal failure) ([place]) 02/11/2020  . Acute lower UTI 02/11/2020  . Hypertensive urgency 02/11/2020  . COVID 02/11/2020  . Postoperative state 04/25/2016    Orientation RESPIRATION BLADDER Height & Weight     Self, Situation, Place  Normal Continent, Indwelling catheter Weight: 117 lb (53.1 kg) Height:  5\' 2"  (157.5 cm)  BEHAVIORAL SYMPTOMS/MOOD NEUROLOGICAL BOWEL NUTRITION STATUS      Continent Diet (Please see DC Summary)  AMBULATORY STATUS COMMUNICATION OF NEEDS Skin   Extensive Assist Verbally Normal                       Personal Care Assistance Level of Assistance  Bathing, Feeding, Dressing Bathing Assistance: Maximum assistance Feeding assistance: Limited assistance Dressing Assistance: Limited assistance     Functional Limitations Info  Sight Sight Info: Impaired        SPECIAL CARE FACTORS FREQUENCY  PT (By licensed PT), OT (By licensed OT)     PT Frequency: 5x/week OT Frequency: 5x/week            Contractures Contractures Info: Not present    Additional Factors Info  Code Status, Allergies, Isolation Precautions Code  Status Info: DNR Allergies Info: NKA     Isolation Precautions Info: COVID+ 02/11/20     Current Medications (02/15/2020):  This is the current hospital active medication list Current Facility-Administered Medications  Medication Dose Route Frequency Provider Last Rate Last Admin  . 0.9 %  sodium chloride infusion   Intravenous PRN Rise Patience, MD      . acetaminophen (TYLENOL) tablet 650 mg  650 mg Oral Q6H PRN Rise Patience, MD   650 mg at 02/12/20 1127  . albuterol (VENTOLIN HFA) 108 (90 Base) MCG/ACT inhaler 2 puff  2 puff Inhalation Once PRN Rise Patience, MD      . amLODipine (NORVASC) tablet 10 mg  10 mg Oral Daily Thurnell Lose, MD   10 mg at 02/15/20 0846  . ascorbic acid (VITAMIN C) tablet 500 mg  500 mg Oral Daily Rise Patience, MD   500 mg at 02/15/20 0846  . cefTRIAXone (ROCEPHIN) 1 g in sodium chloride 0.9 % 100 mL IVPB  1 g Intravenous Q24H Thurnell Lose, MD 200 mL/hr at 02/14/20 1749 1 g at 02/14/20 1749  . Chlorhexidine Gluconate Cloth 2 % PADS 6 each  6 each Topical Daily Thurnell Lose, MD   6 each at 02/14/20 1023  . dexamethasone (DECADRON) injection 6 mg  6 mg Intravenous Q24H Thurnell Lose, MD   6 mg at 02/15/20 0855  . doxycycline (VIBRA-TABS) tablet 100 mg  100 mg  Oral Q12H Thurnell Lose, MD   100 mg at 02/15/20 0846  . enoxaparin (LOVENOX) injection 40 mg  40 mg Subcutaneous Q24H Thurnell Lose, MD   40 mg at 02/14/20 2154  . hydrALAZINE (APRESOLINE) injection 10 mg  10 mg Intravenous Q4H PRN Rise Patience, MD   10 mg at 02/12/20 1059  . metoprolol tartrate (LOPRESSOR) tablet 50 mg  50 mg Oral BID Rise Patience, MD   50 mg at 02/15/20 0846  . pantoprazole sodium (PROTONIX) 40 mg/20 mL oral suspension 40 mg  40 mg Oral Daily Thurnell Lose, MD   40 mg at 02/15/20 0855  . tamsulosin (FLOMAX) capsule 0.4 mg  0.4 mg Oral Daily Thurnell Lose, MD   0.4 mg at 02/15/20 0846  . zinc sulfate capsule 220 mg   220 mg Oral Daily Rise Patience, MD   220 mg at 02/15/20 3428     Discharge Medications: Please see discharge summary for a list of discharge medications.  Relevant Imaging Results:  Relevant Lab Results:   Additional Information SSN: [id number]  [place], LCSW

## 2020-02-15 NOTE — TOC Initial Note (Signed)
Transition of Care [facility]) - Initial/Assessment Note    Patient Details  Name: Sylvia Petersen MRN: [id number] Date of Birth: 26-Feb-1930  Transition of Care [facility]) CM/SW Contact:    Sylvia Halsted, LCSW Phone Number: 02/15/2020, 11:48 AM  Clinical Narrative:                 CSW received consult for possible SNF placement at time of discharge. CSW spoke with patient's son, Sylvia Petersen, regarding PT recommendation of SNF placement at time of discharge. Patient reported that he is currently not able to care for patient given patient's current physical needs and fall risk. He expressed understanding of PT recommendation and is agreeable to SNF placement at time of discharge. CSW discussed insurance authorization process and provided Medicare SNF ratings list and made him aware that Sylvia Petersen is the only facility accepting COVID patients. Patient has been to [facility] this past summer so son is in agreement. [facility] requested Sylvia Petersen contact them to verbally consent that patient does not require long term care, which he assured CSW that patient wants to return home. CSW provided Sylvia Petersen with the contact number and will begin insurance authorization process.    Expected Discharge Plan: Skilled Nursing Facility Barriers to Discharge: SNF Pending bed offer, Insurance Authorization, Continued Medical Work up   Patient Goals and CMS Choice Patient states their goals for this hospitalization and ongoing recovery are:: Rehab then return home CMS Medicare.gov Compare Post Acute Care list provided to:: Patient Represenative (must comment) Choice offered to / list presented to : Adult Children  Expected Discharge Plan and Services Expected Discharge Plan: Sylvia Petersen In-house Referral: Clinical Social Work   Post Acute Care Choice: Sylvia Petersen Living arrangements for the past 2 months: [place]                                      Prior Living Arrangements/Services Living  arrangements for the past 2 months: Single Family Home Lives with:: Adult Children, Self Patient language and need for interpreter reviewed:: Yes Do you feel safe going back to the place where you live?: Yes      Need for Family Participation in Patient Care: Yes (Comment) Care giver support system in place?: Yes (comment)   Criminal Activity/Legal Involvement Pertinent to Current Situation/Hospitalization: No - Comment as needed  Activities of Daily Living Home Assistive Devices/Equipment: None ADL Screening (condition at time of admission) Patient's cognitive ability adequate to safely complete daily activities?: Yes Is the patient deaf or have difficulty hearing?: No Does the patient have difficulty seeing, even when wearing glasses/contacts?: No Does the patient have difficulty concentrating, remembering, or making decisions?: No Patient able to express need for assistance with ADLs?: Yes Does the patient have difficulty dressing or bathing?: Yes Independently performs ADLs?: Yes (appropriate for developmental age) Does the patient have difficulty walking or climbing stairs?: Yes Weakness of Legs: Both Weakness of Arms/Hands: None  Permission Sought/Granted Permission sought to share information with : Facility Sport and exercise psychologist, Family Supports Permission granted to share information with : Yes, Verbal Permission Granted  Share Information with NAME: Sylvia Petersen  Permission granted to share info w AGENCY: SNFs  Permission granted to share info w Relationship: Son  Permission granted to share info w Contact Information: [phone]  Emotional Assessment   Attitude/Demeanor/Rapport: Unable to Assess Affect (typically observed): Unable to Assess Orientation: : Oriented to Self, Oriented to  Place, Oriented to Situation Alcohol / Substance Use: Not Applicable Psych Involvement: No (comment)  Admission diagnosis:  ARF (acute renal failure) ([place]) [N17.9] Fall [W19.XXXA] Acute  lower UTI [N39.0] Urinary tract infection without hematuria, site unspecified [N39.0] COVID [U07.1] Patient Active Problem List   Diagnosis Date Noted  . SIRS (systemic inflammatory response syndrome) ([place]) 02/12/2020  . ARF (acute renal failure) ([place]) 02/11/2020  . Acute lower UTI 02/11/2020  . Hypertensive urgency 02/11/2020  . COVID 02/11/2020  . Postoperative state 04/25/2016   PCP:  [name], MD Pharmacy:   [facility], [address] [place] [address] Phone: [phone] Fax: [phone]     Social Determinants of Health (SDOH) Interventions    Readmission Risk Interventions No flowsheet data found.

## 2020-02-16 LAB — COMPREHENSIVE METABOLIC PANEL
ALT: 199 U/L — ABNORMAL HIGH (ref 0–44)
AST: 139 U/L — ABNORMAL HIGH (ref 15–41)
Albumin: 2.5 g/dL — ABNORMAL LOW (ref 3.5–5.0)
Alkaline Phosphatase: 53 U/L (ref 38–126)
Anion gap: 10 (ref 5–15)
BUN: 41 mg/dL — ABNORMAL HIGH (ref 8–23)
CO2: 19 mmol/L — ABNORMAL LOW (ref 22–32)
Calcium: 9.2 mg/dL (ref 8.9–10.3)
Chloride: 110 mmol/L (ref 98–111)
Creatinine, Ser: 0.87 mg/dL (ref 0.44–1.00)
GFR, Estimated: >60 mL/min (ref >60)
Glucose, Bld: 124 mg/dL — ABNORMAL HIGH (ref 70–99)
Potassium: 4.3 mmol/L (ref 3.5–5.1)
Sodium: 139 mmol/L (ref 135–145)
Total Bilirubin: 0.6 mg/dL (ref 0.3–1.2)
Total Protein: 5.8 g/dL — ABNORMAL LOW (ref 6.5–8.1)

## 2020-02-16 LAB — CBC WITH DIFFERENTIAL/PLATELET
Abs Immature Granulocytes: 0.08 10*3/uL — ABNORMAL HIGH (ref 0.00–0.07)
Basophils Absolute: 0 10*3/uL (ref 0.0–0.1)
Basophils Relative: 0 %
Eosinophils Absolute: 0 10*3/uL (ref 0.0–0.5)
Eosinophils Relative: 0 %
HCT: 34.1 % — ABNORMAL LOW (ref 36.0–46.0)
Hemoglobin: 11.8 g/dL — ABNORMAL LOW (ref 12.0–15.0)
Immature Granulocytes: 1 %
Lymphocytes Relative: 9 %
Lymphs Abs: 0.7 10*3/uL (ref 0.7–4.0)
MCH: 29.6 pg (ref 26.0–34.0)
MCHC: 34.6 g/dL (ref 30.0–36.0)
MCV: 85.7 fL (ref 80.0–100.0)
Monocytes Absolute: 0.3 10*3/uL (ref 0.1–1.0)
Monocytes Relative: 4 %
Neutro Abs: 6.8 10*3/uL (ref 1.7–7.7)
Neutrophils Relative %: 86 %
Platelets: 269 10*3/uL (ref 150–400)
RBC: 3.98 MIL/uL (ref 3.87–5.11)
RDW: 13.2 % (ref 11.5–15.5)
WBC: 7.9 10*3/uL (ref 4.0–10.5)
nRBC: 0 % (ref 0.0–0.2)

## 2020-02-16 LAB — D-DIMER, QUANTITATIVE: D-Dimer, Quant: 1.88 ug/mL-FEU — ABNORMAL HIGH (ref 0.00–0.50)

## 2020-02-16 LAB — PROTIME-INR
INR: 1.1 (ref 0.8–1.2)
Prothrombin Time: 13.4 seconds (ref 11.4–15.2)

## 2020-02-16 LAB — CULTURE, BLOOD (ROUTINE X 2)
Culture: NO GROWTH
Culture: NO GROWTH

## 2020-02-16 LAB — MAGNESIUM: Magnesium: 2 mg/dL (ref 1.7–2.4)

## 2020-02-16 LAB — BRAIN NATRIURETIC PEPTIDE: B Natriuretic Peptide: 124 pg/mL — ABNORMAL HIGH (ref 0.0–100.0)

## 2020-02-16 LAB — C-REACTIVE PROTEIN: CRP: 3 mg/dL — ABNORMAL HIGH (ref <1.0)

## 2020-02-16 LAB — PROCALCITONIN: Procalcitonin: 0.27 ng/mL

## 2020-02-16 MED ORDER — ONDANSETRON HCL 4 MG/2ML IJ SOLN
4.0000 mg | Freq: Four times a day (QID) | INTRAMUSCULAR | Status: DC | PRN
  Administered 2020-02-16: 4 mg via INTRAVENOUS
  Filled 2020-02-16: qty 2

## 2020-02-16 NOTE — Progress Notes (Signed)
PROGRESS NOTE                                                                                                                                                                                                             Patient Demographics:    Sylvia Petersen, is a 86 y.o. female, DOB - Sep 21, 1929, YIR:[id number]  Outpatient Primary MD for the patient is [name], MD   Admit date - 02/11/2020   LOS - 5  Chief Complaint  Patient presents with  . Code Sepsis       Brief Narrative: Patient is a 86 y.o. female with PMHx of HLD, HTN-who has had progressive generalized weakness for approximately 1 week-apparently 4 prior to this hospitalization-patient sustained a mechanical fall-and was on the floor for almost 12 hours-patient was helped back to her bed by neighbors-since then she has been mostly bedbound/weak and frail-family subsequently called EMS and she was brought to the ED.  Upon further evaluation-she was found to have [id number] infection, complicated UTI-and admitted to the hospitalist service.  See below for further details.  COVID-19 vaccinated status: Unvaccinated (claims that she thinks vaccinated people get more Covid than the unvaccinated)  Significant Events: 12/2>> Admit to [facility] for [id number]-[id number] infection, complicated UTI, AKI  Significant studies: 12/2>>Chest x-ray: No active disease 12/3>> x-ray pelvis: No acute abnormality noted  COVID-19 medications: Monoclonal antibody infusion: 12/3 x 1  Antibiotics: Rocephin: 12/2>>  Microbiology data: 12/2 >>blood culture: No growth 12/2>> urine culture: Pending  Procedures: None  Consults: None  DVT prophylaxis: enoxaparin (LOVENOX) injection 40 mg Start: 02/14/20 2200    Subjective:   Patient in bed, appears comfortable, denies any headache, no fever, no chest pain or pressure, no shortness of breath , no abdominal pain. No focal weakness.    Assessment  & Plan :   Sepsis from complicated UTI and IRCVE-93 infection: Continue supportive care-follow cultures.  Afebrile-sepsis pathophysiology has completely resolved.  Pansensitive E. coli UTI: Continue Rocephin, will give 5 days.  COVID-19 infection: Despite chest x-ray being clear now CRP is creeping up and she is mildly short of breath, add 2 L oxygen and Decadron. CRP is trending down and she is now feeling much better. No need to escalate treatment. We will continue steroids only for now.   Recent Labs  Lab  0000 02/11/20 1602 02/11/20  1921 02/11/20 2026 02/11/20 2219 02/12/20 0534 02/13/20 0352 02/14/20 0430 02/14/20 0453 02/15/20 0409 02/15/20 1101 02/16/20 0039  WBC  --   --   --   --    < > 9.6 11.7* 6.4  --  7.5  --  7.9  HGB  --   --   --   --    < > 12.1 11.6* 10.9*  --  11.4*  --  11.8*  HCT  --   --   --   --    < > 37.5 34.2* 34.2*  --  35.4*  --  34.1*  PLT  --   --   --   --    < > 245 243 210  --  249  --  269  CRP  --   --   --   --    < > 11.5* 17.4* 11.9*  --  5.4*  --  3.0*  BNP  --   --   --   --   --   --  170.2*  --  141.3* 159.6*  --  124.0*  DDIMER  --   --   --   --    < > 2.24* 3.35* 3.32*  --  2.26*  --  1.88*  PROCALCITON  --   --   --   --   --   --  1.99 1.19  --  0.55  --  0.27  AST   < >  --   --   --   --  50* 56* 192*  --  270*  --  139*  ALT   < >  --   --   --   --  33 33 126*  --  242*  --  199*  ALKPHOS   < >  --   --   --   --  49 48 44  --  47  --  53  BILITOT   < >  --   --   --   --  0.5 0.4 0.5  --  0.5  --  0.6  ALBUMIN   < >  --   --   --   --  2.8* 2.5* 2.3*  --  2.4*  --  2.5*  INR  --   --   --   --   --   --   --   --   --   --  1.1 1.1  LATICACIDVEN  --  1.3  --  1.6  --   --   --   --   --   --   --   --   SARSCOV2NAA  --   --  POSITIVE*  --   --   --   --   --   --   --   --   --    < > = values in this interval not displayed.     Transaminitis - likely due to COVID-19 and mild rhabdomyolysis, holding statin,  stable right upper quadrant ultrasound, negative hepatitis panel, stable INR, trend is improving.  HLD: Statin on hold due to transaminitis.  AKI: With reported fall at home and prolonged downtime, mild rhabdo, solved after hydration with IV fluids.     Urinary retention.  Foley and Flomax, Foley placed 02/13/2020. Discontinue Foley on 02/16/2020 and monitor.  Elevated D-dimer.  Due to intense inflammation. Detailed lower extremity ultrasound, continue  moderate dose Lovenox, D-dimer is now downtrending.Marland Kitchen  HTN: On Norvasc and beta-blocker.  ACE inhibitor held due to AKI.  As needed hydralazine added.  Severe debility/deconditioning: Secondary to COVID-19 infection-await PT/OT eval.  May need home health services or SNF.  Hypernatremia. Resolved after hydration with D5W.     Condition - Extremely Guarded  Family Communication  :  Son [phone]) updated over the phone 02/13/20 - DNR, updated again on 02/14/20  Code Status : DNR  Diet :  Diet Order            Diet Heart Room service appropriate? Yes; Fluid consistency: Thin  Diet effective now                  Disposition Plan  :   Status is: Inpatient  Remains inpatient appropriate because:Inpatient level of care appropriate due to severity of illness   Dispo: The patient is from: Home              Anticipated d/c is to: TBD              Anticipated d/c date is: > 3 days              Patient currently is not medically stable to d/c.  Barriers to discharge: Complicated UTI requiring IV antibiotics-awaiting culture data, profound generalized debility/weakness-due to COVID-19 infection-needs PT eval to determine appropriate disposition.  Antimicorbials  :    Anti-infectives (From admission, onward)   Start     Dose/Rate Route Frequency Ordered Stop   02/13/20 1800  cefTRIAXone (ROCEPHIN) 1 g in sodium chloride 0.9 % 100 mL IVPB        1 g 200 mL/hr over 30 Minutes Intravenous Every 24 hours 02/13/20 0917 02/19/20  1759   02/13/20 1000  doxycycline (VIBRA-TABS) tablet 100 mg        100 mg Oral Every 12 hours 02/13/20 0917 02/18/20 0959   02/12/20 1900  cefTRIAXone (ROCEPHIN) 1 g in sodium chloride 0.9 % 100 mL IVPB  Status:  Discontinued        1 g 200 mL/hr over 30 Minutes Intravenous Every 24 hours 02/11/20 2117 02/13/20 0917   02/11/20 1830  cefTRIAXone (ROCEPHIN) 1 g in sodium chloride 0.9 % 100 mL IVPB        1 g 200 mL/hr over 30 Minutes Intravenous  Once 02/11/20 1818 02/11/20 2029      Inpatient Medications  Scheduled Meds: . amLODipine  10 mg Oral Daily  . vitamin C  500 mg Oral Daily  . Chlorhexidine Gluconate Cloth  6 each Topical Daily  . dexamethasone (DECADRON) injection  6 mg Intravenous Q24H  . doxycycline  100 mg Oral Q12H  . enoxaparin (LOVENOX) injection  40 mg Subcutaneous Q24H  . metoprolol tartrate  50 mg Oral BID  . pantoprazole sodium  40 mg Oral Daily  . tamsulosin  0.4 mg Oral Daily  . zinc sulfate  220 mg Oral Daily   Continuous Infusions: . sodium chloride    . cefTRIAXone (ROCEPHIN)  IV Stopped (02/15/20 1745)   PRN Meds:.sodium chloride, acetaminophen, albuterol, hydrALAZINE   Time Spent in minutes  25  See all Orders from today for further details   Lala Lund M.D on 02/16/2020 at 10:21 AM  To page go to [email or web address] - use universal password  [facility] -  Office  [phone]    Objective:   Vitals:   02/15/20 5993 02/15/20 1613 02/15/20 2047 02/16/20 0515  BP: (!) 141/80  (!) 154/73 (!) 163/89  Pulse: 78  78 80  Resp: 18  20 20   Temp: 97.6 F (36.4 C)  97.8 F (36.6 C) 97.8 F (36.6 C)  TempSrc: Oral  Axillary Axillary  SpO2: 100% 100% 94% 93%  Weight:      Height:        Wt Readings from Last 3 Encounters:  02/11/20 53.1 kg  09/03/19 64 kg  04/25/16 64.6 kg     Intake/Output Summary (Last 24 hours) at 02/16/2020 1021 Last data filed at 02/16/2020 0905 Gross per 24 hour  Intake 337 ml  Output 1375 ml  Net -1038  ml     Physical Exam  Awake Alert, No new F.N deficits, Foley in place [place].AT,PERRAL Supple Neck,No JVD, No cervical lymphadenopathy appriciated.  Symmetrical Chest wall movement, Good air movement bilaterally, CTAB RRR,No Gallops, Rubs or new Murmurs, No Parasternal Heave +ve B.Sounds, Abd Soft, No tenderness, No organomegaly appriciated, No rebound - guarding or rigidity. No Cyanosis, Clubbing or edema, No new Rash or bruise      Data Review:    CBC Recent Labs  Lab 02/12/20 0534 02/13/20 0352 02/14/20 0430 02/15/20 0409 02/16/20 0039  WBC 9.6 11.7* 6.4 7.5 7.9  HGB 12.1 11.6* 10.9* 11.4* 11.8*  HCT 37.5 34.2* 34.2* 35.4* 34.1*  PLT 245 243 210 249 269  MCV 90.1 86.6 89.8 88.7 85.7  MCH 29.1 29.4 28.6 28.6 29.6  MCHC 32.3 33.9 31.9 32.2 34.6  RDW 13.2 13.8 13.9 13.5 13.2  LYMPHSABS 0.5* 0.6* 0.8 0.8 0.7  MONOABS 0.4 0.2 0.3 0.3 0.3  EOSABS 0.0 0.0 0.0 0.0 0.0  BASOSABS 0.0 0.0 0.0 0.0 0.0    Chemistries  Recent Labs  Lab 02/12/20 0534 02/13/20 0352 02/14/20 0430 02/15/20 0409 02/16/20 0039  NA 137 141 146* 138 139  K 3.3* 3.3* 3.4* 4.0 4.3  CL 103 111 114* 109 110  CO2 21* 19* 22 20* 19*  GLUCOSE 137* 138* 126* 120* 124*  BUN 23 36* 37* 35* 41*  CREATININE 0.98 1.48* 1.03* 0.82 0.87  CALCIUM 8.6* 8.2* 8.3* 8.6* 9.2  MG  --  2.2 2.3 2.1 2.0  AST 50* 56* 192* 270* 139*  ALT 33 33 126* 242* 199*  ALKPHOS 49 48 44 47 53  BILITOT 0.5 0.4 0.5 0.5 0.6   ------------------------------------------------------------------------------------------------------------------ No results for input(s): CHOL, HDL, LDLCALC, TRIG, CHOLHDL, LDLDIRECT in the last 72 hours.  No results found for: HGBA1C ------------------------------------------------------------------------------------------------------------------ No results for input(s): TSH, T4TOTAL, T3FREE, THYROIDAB in the last 72 hours.  Invalid input(s):  FREET3 ------------------------------------------------------------------------------------------------------------------ No results for input(s): VITAMINB12, FOLATE, FERRITIN, TIBC, IRON, RETICCTPCT in the last 72 hours.  Coagulation profile Recent Labs  Lab 02/15/20 1101 02/16/20 0039  INR 1.1 1.1    Recent Labs    02/15/20 0409 02/16/20 0039  DDIMER 2.26* 1.88*    Cardiac Enzymes No results for input(s): CKMB, TROPONINI, MYOGLOBIN in the last 168 hours.  Invalid input(s): CK ------------------------------------------------------------------------------------------------------------------    Component Value Date/Time   BNP 124.0 (H) 02/16/2020 0039    Micro Results Recent Results (from the past 240 hour(s))  Culture, blood (routine x 2)     Status: None   Collection Time: 02/11/20  4:02 PM   Specimen: BLOOD  Result Value Ref Range Status   Specimen Description BLOOD RIGHT ANTECUBITAL  Final   Special Requests   Final    BOTTLES DRAWN AEROBIC AND ANAEROBIC Blood Culture results may not  be optimal due to an inadequate volume of blood received in culture bottles   Culture   Final    NO GROWTH 5 DAYS Performed at [facility], [place] [address], [employer] [place], [place] [address]    Report Status 02/16/2020 FINAL  Final  Urine culture     Status: Abnormal   Collection Time: 02/11/20  4:52 PM   Specimen: Urine, Random  Result Value Ref Range Status   Specimen Description URINE, RANDOM  Final   Special Requests   Final    NONE Performed at [facility], [place] [address] [place], [place] [address]    Culture >=100,000 COLONIES/mL ESCHERICHIA COLI (A)  Final   Report Status 02/14/2020 FINAL  Final   Organism ID, Bacteria ESCHERICHIA COLI (A)  Final      Susceptibility   Escherichia coli - MIC*    AMPICILLIN >=32 RESISTANT Resistant     CEFAZOLIN 16 SENSITIVE Sensitive     CEFEPIME <=0.12 SENSITIVE Sensitive     CEFTRIAXONE <=0.25 SENSITIVE Sensitive      CIPROFLOXACIN <=0.25 SENSITIVE Sensitive     GENTAMICIN <=1 SENSITIVE Sensitive     IMIPENEM 0.5 SENSITIVE Sensitive     NITROFURANTOIN <=16 SENSITIVE Sensitive     TRIMETH/SULFA <=20 SENSITIVE Sensitive     AMPICILLIN/SULBACTAM >=32 RESISTANT Resistant     PIP/TAZO <=4 SENSITIVE Sensitive     * >=100,000 COLONIES/mL ESCHERICHIA COLI  Culture, blood (routine x 2)     Status: None   Collection Time: 02/11/20  5:00 PM   Specimen: BLOOD RIGHT ARM  Result Value Ref Range Status   Specimen Description BLOOD RIGHT ARM  Final   Special Requests   Final    BOTTLES DRAWN AEROBIC ONLY Blood Culture results may not be optimal due to an inadequate volume of blood received in culture bottles   Culture   Final    NO GROWTH 5 DAYS Performed at [facility], [place] [address], [place], [place] [address]    Report Status 02/16/2020 FINAL  Final  Resp Panel by RT-PCR (Flu A&B, Covid) Nasopharyngeal Swab     Status: Abnormal   Collection Time: 02/11/20  7:21 PM   Specimen: Nasopharyngeal Swab; Nasopharyngeal(NP) swabs in vial transport medium  Result Value Ref Range Status   SARS Coronavirus 2 by RT PCR POSITIVE (A) NEGATIVE Final    Comment: RESULT CALLED TO, READ BACK BY AND VERIFIED WITH: Teofilo Pod RN 02/11/20 AT 2158 SK (NOTE) SARS-CoV-2 target nucleic acids are DETECTED.  The SARS-CoV-2 RNA is generally detectable in upper respiratory specimens during the acute phase of infection. Positive results are indicative of the presence of the identified virus, but do not rule out bacterial infection or co-infection with other pathogens not detected by the test. Clinical correlation with patient history and other diagnostic information is necessary to determine patient infection status. The expected result is Negative.  Fact Sheet for Patients: EntrepreneurPulse.com.au  Fact Sheet for Healthcare Providers: IncredibleEmployment.be  This test is not yet  approved or cleared by the [place] FDA and  has been authorized for detection and/or diagnosis of SARS-CoV-2 by FDA under an Emergency Use Authorization (EUA).  This EUA will remain in effect (meaning this test can be  used) for the duration of  the COVID-19 declaration under Section 564(b)(1) of the Act, 21 U.S.C. section 360bbb-3(b)(1), unless the authorization is terminated or revoked sooner.     Influenza A by PCR NEGATIVE NEGATIVE Final  Influenza B by PCR NEGATIVE NEGATIVE Final    Comment: (NOTE) The Xpert Xpress SARS-CoV-2/FLU/RSV plus assay is intended as an aid in the diagnosis of influenza from Nasopharyngeal swab specimens and should not be used as a sole basis for treatment. Nasal washings and aspirates are unacceptable for Xpert Xpress SARS-CoV-2/FLU/RSV testing.  Fact Sheet for Patients: EntrepreneurPulse.com.au  Fact Sheet for Healthcare Providers: IncredibleEmployment.be  This test is not yet approved or cleared by the [place] FDA and has been authorized for detection and/or diagnosis of SARS-CoV-2 by FDA under an Emergency Use Authorization (EUA). This EUA will remain in effect (meaning this test can be used) for the duration of the COVID-19 declaration under Section 564(b)(1) of the Act, 21 U.S.C. section 360bbb-3(b)(1), unless the authorization is terminated or revoked.  Performed at [facility], [place] [address] [place], [place] [address]     Radiology Reports DG Pelvis 1-2 Views  Result Date: 02/12/2020 CLINICAL DATA:  Weakness and foul-smelling urine EXAM: PELVIS - 1 VIEW COMPARISON:  09/03/2019 FINDINGS: There is no evidence of pelvic fracture or diastasis. No pelvic bone lesions are seen. IMPRESSION: No acute abnormality noted. Electronically Signed   By: Inez Catalina M.D.   On: 02/12/2020 02:13   CT HEAD WO CONTRAST  Result Date: 02/12/2020 CLINICAL DATA:  Weakness for 1 week EXAM: CT HEAD  WITHOUT CONTRAST CT CERVICAL SPINE WITHOUT CONTRAST TECHNIQUE: Multidetector CT imaging of the head and cervical spine was performed following the standard protocol without intravenous contrast. Multiplanar CT image reconstructions of the cervical spine were also generated. COMPARISON:  None. FINDINGS: CT HEAD FINDINGS Brain: There is no mass, hemorrhage or extra-axial collection. The size and configuration of the ventricles and extra-axial CSF spaces are normal. There is hypoattenuation of the periventricular white matter, most commonly indicating chronic ischemic microangiopathy. Vascular: No abnormal hyperdensity of the major intracranial arteries or dural venous sinuses. No intracranial atherosclerosis. Skull: The visualized skull base, calvarium and extracranial soft tissues are normal. Sinuses/Orbits: No fluid levels or advanced mucosal thickening of the visualized paranasal sinuses. No mastoid or middle ear effusion. The orbits are normal. CT CERVICAL SPINE FINDINGS Alignment: No static subluxation. Facets are aligned. Occipital condyles are normally positioned. Skull base and vertebrae: No acute fracture. Soft tissues and spinal canal: No prevertebral fluid or swelling. No visible canal hematoma. Disc levels: No advanced spinal canal or neural foraminal stenosis. Upper chest: No pneumothorax, pulmonary nodule or pleural effusion. Other: Calcific aortic atherosclerosis. IMPRESSION: 1. Chronic ischemic microangiopathy without acute intracranial abnormality. 2. No acute fracture or static subluxation of the cervical spine. Aortic Atherosclerosis (ICD10-I70.0). Electronically Signed   By: Ulyses Jarred M.D.   On: 02/12/2020 01:11   CT CERVICAL SPINE WO CONTRAST  Result Date: 02/12/2020 CLINICAL DATA:  Weakness for 1 week EXAM: CT HEAD WITHOUT CONTRAST CT CERVICAL SPINE WITHOUT CONTRAST TECHNIQUE: Multidetector CT imaging of the head and cervical spine was performed following the standard protocol without  intravenous contrast. Multiplanar CT image reconstructions of the cervical spine were also generated. COMPARISON:  None. FINDINGS: CT HEAD FINDINGS Brain: There is no mass, hemorrhage or extra-axial collection. The size and configuration of the ventricles and extra-axial CSF spaces are normal. There is hypoattenuation of the periventricular white matter, most commonly indicating chronic ischemic microangiopathy. Vascular: No abnormal hyperdensity of the major intracranial arteries or dural venous sinuses. No intracranial atherosclerosis. Skull: The visualized skull base, calvarium and extracranial soft tissues are normal. Sinuses/Orbits: No fluid levels or advanced mucosal thickening  of the visualized paranasal sinuses. No mastoid or middle ear effusion. The orbits are normal. CT CERVICAL SPINE FINDINGS Alignment: No static subluxation. Facets are aligned. Occipital condyles are normally positioned. Skull base and vertebrae: No acute fracture. Soft tissues and spinal canal: No prevertebral fluid or swelling. No visible canal hematoma. Disc levels: No advanced spinal canal or neural foraminal stenosis. Upper chest: No pneumothorax, pulmonary nodule or pleural effusion. Other: Calcific aortic atherosclerosis. IMPRESSION: 1. Chronic ischemic microangiopathy without acute intracranial abnormality. 2. No acute fracture or static subluxation of the cervical spine. Aortic Atherosclerosis (ICD10-I70.0). Electronically Signed   By: Ulyses Jarred M.D.   On: 02/12/2020 01:11   US RENAL  Result Date: 02/13/2020 CLINICAL DATA:  Acute kidney injury. EXAM: RENAL / URINARY TRACT ULTRASOUND COMPLETE COMPARISON:  None. FINDINGS: Right Kidney: Renal measurements: 9.9 x 4.7 x 4.9 cm = volume: 118.5 mL. Mild renal cortical thinning. Mild to moderate hydronephrosis. No mass, no mass or stone. Normal parenchymal echogenicity. Trace amount of nonspecific perinephric edema or fluid. Left Kidney: Renal measurements: 10.2 x 4.7 x 4.3 cm =  volume: 111.4 mL. Renal cortical thinning. Slight dilation of the intrarenal collecting system. Normal parenchymal echogenicity. No mass or stone. Trace amount of perinephric fluid or edema. Bladder: Distended. Small amount of dependent debris. No mass or stone. Ureteral jets not visualized. Other: None. IMPRESSION: 1. Mild to moderate right and slight left renal collecting system dilation, suspected to be on the basis of bladder distension. No renal masses or stones. 2. Renal cortical thinning, left greater than right. Electronically Signed   By: Lajean Manes M.D.   On: 02/13/2020 11:24   DG Chest Port 1 View  Result Date: 02/13/2020 CLINICAL DATA:  Shortness of breath, COVID-19 positivity EXAM: PORTABLE CHEST 1 VIEW COMPARISON:  02/11/2020 FINDINGS: Cardiac shadow is stable. Aortic calcifications are noted. The lungs are well aerated bilaterally. No focal infiltrate or sizable effusion is seen. No acute bony abnormality is seen. IMPRESSION: No acute abnormality noted. Electronically Signed   By: Inez Catalina M.D.   On: 02/13/2020 08:52   DG Chest Portable 1 View  Result Date: 02/11/2020 CLINICAL DATA:  Weakness. EXAM: PORTABLE CHEST 1 VIEW COMPARISON:  CT chest 12/0 scratch the CT chest 02/19/2019. FINDINGS: The lungs clear. Heart size normal. Aortic atherosclerosis. No pneumothorax or pleural fluid. IMPRESSION: No acute disease. Aortic Atherosclerosis (ICD10-I70.0). Electronically Signed   By: Inge Rise M.D.   On: 02/11/2020 17:46   VAS [place] LOWER EXTREMITY VENOUS (DVT)  Result Date: 02/15/2020  Lower Venous DVT Study Indications: Swelling.  Risk Factors: COVID 19 positive. Comparison Study: No prior studies. Performing Technologist: Oliver Hum RVT  Examination Guidelines: A complete evaluation includes B-mode imaging, spectral Doppler, color Doppler, and power Doppler as needed of all accessible portions of each vessel. Bilateral testing is considered an integral part of a complete  examination. Limited examinations for reoccurring indications may be performed as noted. The reflux portion of the exam is performed with the patient in reverse Trendelenburg.  +---------+---------------+---------+-----------+----------+--------------+ RIGHT    CompressibilityPhasicitySpontaneityPropertiesThrombus Aging +---------+---------------+---------+-----------+----------+--------------+ CFV      Full           Yes      Yes                                 +---------+---------------+---------+-----------+----------+--------------+ SFJ      Full                                                        +---------+---------------+---------+-----------+----------+--------------+  FV Prox  Full                                                        +---------+---------------+---------+-----------+----------+--------------+ FV Mid   Full                                                        +---------+---------------+---------+-----------+----------+--------------+ FV DistalFull                                                        +---------+---------------+---------+-----------+----------+--------------+ PFV      Full                                                        +---------+---------------+---------+-----------+----------+--------------+ POP      Full           Yes      Yes                                 +---------+---------------+---------+-----------+----------+--------------+ PTV      Full                                                        +---------+---------------+---------+-----------+----------+--------------+ PERO     Full                                                        +---------+---------------+---------+-----------+----------+--------------+   +---------+---------------+---------+-----------+----------+--------------+ LEFT     CompressibilityPhasicitySpontaneityPropertiesThrombus Aging  +---------+---------------+---------+-----------+----------+--------------+ CFV      Full           Yes      Yes                                 +---------+---------------+---------+-----------+----------+--------------+ SFJ      Full                                                        +---------+---------------+---------+-----------+----------+--------------+ FV Prox  Full                                                        +---------+---------------+---------+-----------+----------+--------------+  FV Mid   Full                                                        +---------+---------------+---------+-----------+----------+--------------+ FV DistalFull                                                        +---------+---------------+---------+-----------+----------+--------------+ PFV      Full                                                        +---------+---------------+---------+-----------+----------+--------------+ POP      Full           Yes      Yes                                 +---------+---------------+---------+-----------+----------+--------------+ PTV      Full                                                        +---------+---------------+---------+-----------+----------+--------------+ PERO     Full                                                        +---------+---------------+---------+-----------+----------+--------------+     Summary: RIGHT: - There is no evidence of deep vein thrombosis in the lower extremity.  - No cystic structure found in the popliteal fossa.  LEFT: - There is no evidence of deep vein thrombosis in the lower extremity.  - No cystic structure found in the popliteal fossa.  *See table(s) above for measurements and observations. Electronically signed by Ruta Hinds MD on 02/15/2020 at 5:00:56 PM.    Final    US Abdomen Limited RUQ (LIVER/GB)  Result Date: 02/14/2020 CLINICAL DATA:   Transaminitis EXAM: ULTRASOUND ABDOMEN LIMITED RIGHT UPPER QUADRANT COMPARISON:  None. FINDINGS: Gallbladder: No gallstones or wall thickening visualized. No sonographic Murphy sign noted by sonographer. Common bile duct: Diameter: 6 mm Liver: No focal lesion identified. Within normal limits in parenchymal echogenicity. Portal vein is patent on color Doppler imaging with normal direction of blood flow towards the liver. Other: Incidentally noted is a right-sided pleural effusion. IMPRESSION: 1. No specific abnormality to explain the patient's transaminitis. 2. Incidentally noted right-sided pleural effusion. Electronically Signed   By: Constance Holster M.D.   On: 02/14/2020 19:05

## 2020-02-16 NOTE — Plan of Care (Signed)
Patient is currently resting in bed. C/o pain on bottom, repositioned with multiple pillows. Patient given tylenol. Foley in place. VSS. Remains on room air. AOx4, forgetful at times. Tolerating diet. Call bell within reach. Bed alarm on.   Problem: Education: Goal: Knowledge of risk factors and measures for prevention of condition will improve Outcome: Progressing   Problem: Coping: Goal: Psychosocial and spiritual needs will be supported Outcome: Progressing   Problem: Respiratory: Goal: Will maintain a patent airway Outcome: Progressing Goal: Complications related to the disease process, condition or treatment will be avoided or minimized Outcome: Progressing   Problem: Education: Goal: Knowledge of General Education information will improve Description: Including pain rating scale, medication(s)/side effects and non-pharmacologic comfort measures Outcome: Progressing   Problem: Health Behavior/Discharge Planning: Goal: Ability to manage health-related needs will improve Outcome: Progressing   Problem: Clinical Measurements: Goal: Ability to maintain clinical measurements within normal limits will improve Outcome: Progressing Goal: Will remain free from infection Outcome: Progressing Goal: Diagnostic test results will improve Outcome: Progressing Goal: Respiratory complications will improve Outcome: Progressing Goal: Cardiovascular complication will be avoided Outcome: Progressing   Problem: Activity: Goal: Risk for activity intolerance will decrease Outcome: Progressing   Problem: Nutrition: Goal: Adequate nutrition will be maintained Outcome: Progressing   Problem: Coping: Goal: Level of anxiety will decrease Outcome: Progressing   Problem: Elimination: Goal: Will not experience complications related to bowel motility Outcome: Progressing Goal: Will not experience complications related to urinary retention Outcome: Progressing   Problem: Pain  Managment: Goal: General experience of comfort will improve Outcome: Progressing   Problem: Safety: Goal: Ability to remain free from injury will improve Outcome: Progressing   Problem: Skin Integrity: Goal: Risk for impaired skin integrity will decrease Outcome: Progressing

## 2020-02-16 NOTE — TOC Progression Note (Addendum)
Transition of Care [facility]) - Progression Note    Patient Details  Name: Sylvia Petersen MRN: [id number] Date of Birth: 06/07/1929  Transition of [facility]) CM/SW [place], LCSW Phone Number: 02/16/2020, 2:07 PM  Clinical Narrative:    2pm-CSW received denial from [facility] for SNF placement but did get approval for [facility] transport [phone]). CSW offered peer-to-peer to [place] but he requested CSW make family aware to see if they can take patient back home as he does not feel peer to peer is warranted. CSW left voicemail for patient's son, Sylvia Petersen.   2:30pm-CSW spoke with patient's son Sylvia Petersen. He requested CSW find out about an appeal process while he discusses with his brother. CSW contacted [facility] to make them aware. They will fax over a denial letter and that will have the appeal information on it for the family.   3:20pm-CSW received call from patient's other son, Sylvia Petersen, and CSW provided him with an update. He reported that patient is active with [facility] but they need for her to be able to walk some first before taking her home. CSW awaiting denial letter from [facility].   5pm-CSW received [facility] denial letter by fax and provided it to patient (and emailed copy to patient's son, Sylvia Petersen). CSW provided appeal phone number to [place]. Could take up to 72 hours once completed.    Expected Discharge Plan: Skilled Nursing Facility Barriers to Discharge: SNF Pending bed offer, Ship broker, Continued Medical Work up  Expected Discharge Plan and Services Expected Discharge Plan: [place] In-house Referral: Clinical Social Work   Post Acute Care Choice: [place] Living arrangements for the past 2 months: Single Family Home                                       Social Determinants of Health (SDOH) Interventions    Readmission Risk Interventions No flowsheet data found.

## 2020-02-16 NOTE — Progress Notes (Signed)
Physical Therapy Treatment Patient Details Name: Sylvia Petersen MRN: [id number] DOB: 1929-08-16 Today's Date: 02/16/2020    History of Present Illness Patient is a 86 y.o. female with PMHx of HLD, HTN-who has had progressive generalized weakness for approximately 1 week-apparently 4 prior to this hospitalization-patient sustained a mechanical fall-and was on the floor for almost 12 hours-patient was helped back to her bed by neighbors-since then she has been mostly bedbound/weak and frail-family subsequently called EMS and she was brought to the ED.    PT Comments    Continuing work on functional mobility and activity tolerance;  Today's session focused on sitting posture and balance, with particular attention to preventing a heavy posterior lean, which makes sit to stand transfers nearly impossible; Ms. Nesser requires Max to Total Assist for bed mobility and transfers; This therapist was able to maintain hips flexed with transition from sidelie to sit, which lead to better upright sitting, and able to get  BP in sitting -- with a notable drops in SBP at initial and 3 minutes compared to supine and concomitant with feeling sick (See vitals flow sheet, or below); Total assist with standing trials, and difficulty getting to fully upright posture; Noted that in order for her to be able to dc home she will need to be able to stand and walk; Strongly recommend post-acute rehab at SNF level to maximize independence and safety with mobility and ADLs and enable her to transition home safely; At her current status, she requires Max to Total assist, and would require 2 person Total assist to safely attempt walking.  Follow Up Recommendations  SNF     Equipment Recommendations  Rolling walker with 5" wheels;3in1 (PT);Wheelchair (measurements PT);Wheelchair cushion (measurements PT);Hospital bed    Recommendations for Other Services       Precautions / Restrictions Precautions Precautions:  Fall Precaution Comments: watch for orthostatic hypotension    Mobility  Bed Mobility Overal bed mobility: Needs Assistance Bed Mobility: Rolling;Sidelying to Sit;Sit to Supine Rolling: Max assist Sidelying to sit: Total assist   Sit to supine: Total assist   General bed mobility comments: Used bed pad for rolling, and kept pt in hips-flexed position during trnasition form sidelying to sit, heavy dependence on use of support at shoulder girdle and downward force applied at R pelvis for better achievement of sitting upright; close guard to keep from pushing back into posterior lean like last session; Obtained BP in sitting at initial sit and at 3 min sit  Transfers Overall transfer level: Needs assistance Equipment used:  (Bed pad to cradle hips) Transfers: Sit to/from Stand Sit to Stand: Total assist         General transfer comment: Attempted sit to stand using bed pad to cradle hips and knees blocked for stability; no weight bearing response noted when center of mass was translated over feet; unable to try for standing BP  Ambulation/Gait                 Stairs             Wheelchair Mobility    Modified Rankin (Stroke Patients Only)       Balance     Sitting balance-Leahy Scale: Zero       Standing balance-Leahy Scale: Zero                              Cognition Arousal/Alertness: Lethargic (Eyes closed most of session; one  word answers) Behavior During Therapy: Flat affect Overall Cognitive Status: No family/caregiver present to determine baseline cognitive functioning                                 General Comments: Much less engagement in PT session today      Exercises      General Comments General comments (skin integrity, edema, etc.):   02/16/20 1130 02/16/20 1141 02/16/20 1144  Vital Signs  Patient Position (if appropriate) Orthostatic Vitals  --   --   Orthostatic Lying   BP- Lying 119/63  --   131/72 (map 90)  Pulse- Lying 73  --  75  Orthostatic Sitting  BP- Sitting 107/56 96/56 (map67) reports feeling sick  --   Pulse- Sitting 62 74  --     02/16/20 1146  Vital Signs  Patient Position (if appropriate)  --   Orthostatic Lying   BP- Lying  --   Pulse- Lying  --   Orthostatic Sitting  BP- Sitting 127/57 (79)  Pulse- Sitting 70 (bed in semi-chair position)         Pertinent Vitals/Pain Pain Assessment: Faces Faces Pain Scale: Hurts little more Pain Location: Generalized; unable to specify; grimace while sitting EOB; saying she feels "sick" Pain Descriptors / Indicators: Grimacing;Discomfort Pain Intervention(s): Monitored during session;Repositioned    Home Living                      Prior Function            PT Goals (current goals can now be found in the care plan section) Acute Rehab PT Goals Patient Stated Goal: "lay me back down" PT Goal Formulation: With patient Time For Goal Achievement: 02/28/20 Potential to Achieve Goals: Good Progress towards PT goals: Progressing toward goals (extremely slowly; feeling sick, limited participation)    Frequency    Min 2X/week      PT Plan Current plan remains appropriate    Co-evaluation              AM-PAC PT "6 Clicks" Mobility   Outcome Measure  Help needed turning from your back to your side while in a flat bed without using bedrails?: A Lot Help needed moving from lying on your back to sitting on the side of a flat bed without using bedrails?: Total Help needed moving to and from a bed to a chair (including a wheelchair)?: Total Help needed standing up from a chair using your arms (e.g., wheelchair or bedside chair)?: Total Help needed to walk in hospital room?: Total Help needed climbing 3-5 steps with a railing? : Total 6 Click Score: 7    End of Session Equipment Utilized During Treatment: Other (comment) (bed pad) Activity Tolerance: Other (comment) (Limited by feeling  sick and notable BP drop) Patient left: in bed;with call bell/phone within reach;with bed alarm set (bed in semi-chair position) Nurse Communication: Mobility status PT Visit Diagnosis: Unsteadiness on feet (R26.81);Other abnormalities of gait and mobility (R26.89);History of falling (Z91.81)     Time: 1129-1150 PT Time Calculation (min) (ACUTE ONLY): 21 min  Charges:  $Therapeutic Activity: 8-22 mins                     Roney Marion, PT  Acute Rehabilitation Services Pager [phone] Office [place] 02/16/2020, 4:22 PM

## 2020-02-17 LAB — CBC WITH DIFFERENTIAL/PLATELET
Abs Immature Granulocytes: 0.1 10*3/uL — ABNORMAL HIGH (ref 0.00–0.07)
Basophils Absolute: 0 10*3/uL (ref 0.0–0.1)
Basophils Relative: 0 %
Eosinophils Absolute: 0 10*3/uL (ref 0.0–0.5)
Eosinophils Relative: 0 %
HCT: 36.3 % (ref 36.0–46.0)
Hemoglobin: 11.6 g/dL — ABNORMAL LOW (ref 12.0–15.0)
Immature Granulocytes: 1 %
Lymphocytes Relative: 6 %
Lymphs Abs: 0.6 10*3/uL — ABNORMAL LOW (ref 0.7–4.0)
MCH: 28.1 pg (ref 26.0–34.0)
MCHC: 32 g/dL (ref 30.0–36.0)
MCV: 87.9 fL (ref 80.0–100.0)
Monocytes Absolute: 0.6 10*3/uL (ref 0.1–1.0)
Monocytes Relative: 6 %
Neutro Abs: 7.9 10*3/uL — ABNORMAL HIGH (ref 1.7–7.7)
Neutrophils Relative %: 87 %
Platelets: 306 10*3/uL (ref 150–400)
RBC: 4.13 MIL/uL (ref 3.87–5.11)
RDW: 13.2 % (ref 11.5–15.5)
WBC: 9.2 10*3/uL (ref 4.0–10.5)
nRBC: 0 % (ref 0.0–0.2)

## 2020-02-17 LAB — COMPREHENSIVE METABOLIC PANEL
ALT: 164 U/L — ABNORMAL HIGH (ref 0–44)
AST: 79 U/L — ABNORMAL HIGH (ref 15–41)
Albumin: 2.5 g/dL — ABNORMAL LOW (ref 3.5–5.0)
Alkaline Phosphatase: 55 U/L (ref 38–126)
Anion gap: 11 (ref 5–15)
BUN: 39 mg/dL — ABNORMAL HIGH (ref 8–23)
CO2: 21 mmol/L — ABNORMAL LOW (ref 22–32)
Calcium: 9.4 mg/dL (ref 8.9–10.3)
Chloride: 107 mmol/L (ref 98–111)
Creatinine, Ser: 0.77 mg/dL (ref 0.44–1.00)
GFR, Estimated: >60 mL/min (ref >60)
Glucose, Bld: 118 mg/dL — ABNORMAL HIGH (ref 70–99)
Potassium: 4 mmol/L (ref 3.5–5.1)
Sodium: 139 mmol/L (ref 135–145)
Total Bilirubin: 0.5 mg/dL (ref 0.3–1.2)
Total Protein: 5.7 g/dL — ABNORMAL LOW (ref 6.5–8.1)

## 2020-02-17 LAB — C-REACTIVE PROTEIN: CRP: 1.6 mg/dL — ABNORMAL HIGH (ref <1.0)

## 2020-02-17 LAB — GLUCOSE, CAPILLARY: Glucose-Capillary: 127 mg/dL — ABNORMAL HIGH (ref 70–99)

## 2020-02-17 LAB — D-DIMER, QUANTITATIVE: D-Dimer, Quant: 1.88 ug/mL-FEU — ABNORMAL HIGH (ref 0.00–0.50)

## 2020-02-17 LAB — PROTIME-INR
INR: 1 (ref 0.8–1.2)
Prothrombin Time: 13.2 seconds (ref 11.4–15.2)

## 2020-02-17 LAB — BRAIN NATRIURETIC PEPTIDE: B Natriuretic Peptide: 153.7 pg/mL — ABNORMAL HIGH (ref 0.0–100.0)

## 2020-02-17 LAB — PROCALCITONIN: Procalcitonin: 0.11 ng/mL

## 2020-02-17 LAB — MAGNESIUM: Magnesium: 2.1 mg/dL (ref 1.7–2.4)

## 2020-02-17 NOTE — Plan of Care (Signed)
  Problem: Education: Goal: Knowledge of risk factors and measures for prevention of condition will improve Outcome: Progressing   Problem: Respiratory: Goal: Will maintain a patent airway Outcome: Progressing Goal: Complications related to the disease process, condition or treatment will be avoided or minimized Outcome: Progressing   Problem: Education: Goal: Knowledge of General Education information will improve Description: Including pain rating scale, medication(s)/side effects and non-pharmacologic comfort measures Outcome: Progressing   Problem: Clinical Measurements: Goal: Ability to maintain clinical measurements within normal limits will improve Outcome: Progressing Goal: Will remain free from infection Outcome: Progressing Goal: Diagnostic test results will improve Outcome: Progressing Goal: Respiratory complications will improve Outcome: Progressing Goal: Cardiovascular complication will be avoided Outcome: Progressing   Problem: Coping: Goal: Level of anxiety will decrease Outcome: Progressing   Problem: Elimination: Goal: Will not experience complications related to bowel motility Outcome: Progressing Goal: Will not experience complications related to urinary retention Outcome: Progressing   Problem: Pain Managment: Goal: General experience of comfort will improve Outcome: Progressing   Problem: Safety: Goal: Ability to remain free from injury will improve Outcome: Progressing   Problem: Skin Integrity: Goal: Risk for impaired skin integrity will decrease Outcome: Progressing   Problem: Coping: Goal: Psychosocial and spiritual needs will be supported Outcome: Not Progressing   Problem: Health Behavior/Discharge Planning: Goal: Ability to manage health-related needs will improve Outcome: Not Progressing   Problem: Activity: Goal: Risk for activity intolerance will decrease Outcome: Not Progressing   Problem: Nutrition: Goal: Adequate  nutrition will be maintained Outcome: Not Progressing

## 2020-02-17 NOTE — Progress Notes (Signed)
Pt second bladder scan showed retention of 402 ml urine; order stated place foley if > 350 for second scan.  Foley placed at 0545.

## 2020-02-17 NOTE — Plan of Care (Signed)
  Problem: Respiratory: Goal: Will maintain a patent airway Outcome: Progressing   Problem: Education: Goal: Knowledge of General Education information will improve Description: Including pain rating scale, medication(s)/side effects and non-pharmacologic comfort measures Outcome: Not Progressing   Problem: Health Behavior/Discharge Planning: Goal: Ability to manage health-related needs will improve Outcome: Not Progressing

## 2020-02-17 NOTE — Progress Notes (Signed)
PROGRESS NOTE                                                                                                                                                                                                             Patient Demographics:    Sylvia Petersen, is a 86 y.o. female, DOB - Dec 02, 1929, LEX:[id number]  Outpatient Primary MD for the patient is [name], MD   Admit date - 02/11/2020   LOS - 6  Chief Complaint  Patient presents with  . Code Sepsis       Brief Narrative: Patient is a 86 y.o. female with PMHx of HLD, HTN-who has had progressive generalized weakness for approximately 1 week-apparently 4 prior to this hospitalization-patient sustained a mechanical fall-and was on the floor for almost 12 hours-patient was helped back to her bed by neighbors-since then she has been mostly bedbound/weak and frail-family subsequently called EMS and she was brought to the ED.  Upon further evaluation-she was found to have [id number] infection, complicated UTI-and admitted to the hospitalist service.  See below for further details.  COVID-19 vaccinated status: Unvaccinated (claims that she thinks vaccinated people get more Covid than the unvaccinated)  Significant Events: 12/2>> Admit to [facility] for [id number] infection, complicated UTI, AKI  Significant studies: 12/2>>Chest x-ray: No active disease 12/3>> x-ray pelvis: No acute abnormality noted  COVID-19 medications: Monoclonal antibody infusion: 12/3 x 1  Antibiotics: Rocephin: 12/2>>  Microbiology data: 12/2 >>blood culture: No growth 12/2>> urine culture: Pending  Procedures: None  Consults: None  DVT prophylaxis: enoxaparin (LOVENOX) injection 40 mg Start: 02/14/20 2200    Subjective:   Patient in bed, appears comfortable, denies any headache, no fever, no chest pain or pressure, no shortness of breath , no abdominal pain. No focal weakness.    Assessment  & Plan :   Sepsis from complicated UTI and [id number]-[id number] infection: Continue supportive care-follow cultures.  Afebrile-sepsis pathophysiology has completely resolved.  Pansensitive E. coli UTI: Continue Rocephin, will give 5 days.  COVID-19 infection: Despite chest x-ray being clear now CRP is creeping up and she is mildly short of breath, add 2 L oxygen and Decadron. CRP is trending down and she is now feeling much better. No need to escalate treatment. We will continue steroids only for now.   Recent Labs  Lab 02/11/20 1602 02/11/20 1921 02/11/20  2026 02/11/20 2219 02/13/20 0352 02/14/20 0430 02/14/20 0453 02/15/20 0409 02/15/20 1101 02/16/20 0039 02/17/20 0104  WBC  --   --   --    < > 11.7* 6.4  --  7.5  --  7.9 9.2  HGB  --   --   --    < > 11.6* 10.9*  --  11.4*  --  11.8* 11.6*  HCT  --   --   --    < > 34.2* 34.2*  --  35.4*  --  34.1* 36.3  PLT  --   --   --    < > 243 210  --  249  --  269 306  CRP  --   --   --    < > 17.4* 11.9*  --  5.4*  --  3.0* 1.6*  BNP  --   --   --   --  170.2*  --  141.3* 159.6*  --  124.0* 153.7*  DDIMER  --   --   --    < > 3.35* 3.32*  --  2.26*  --  1.88* 1.88*  PROCALCITON  --   --   --   --  1.99 1.19  --  0.55  --  0.27 0.11  AST  --   --   --    < > 56* 192*  --  270*  --  139* 79*  ALT  --   --   --    < > 33 126*  --  242*  --  199* 164*  ALKPHOS  --   --   --    < > 48 44  --  47  --  53 55  BILITOT  --   --   --    < > 0.4 0.5  --  0.5  --  0.6 0.5  ALBUMIN  --   --   --    < > 2.5* 2.3*  --  2.4*  --  2.5* 2.5*  INR  --   --   --   --   --   --   --   --  1.1 1.1 1.0  LATICACIDVEN 1.3  --  1.6  --   --   --   --   --   --   --   --   SARSCOV2NAA  --  POSITIVE*  --   --   --   --   --   --   --   --   --    < > = values in this interval not displayed.     Transaminitis - likely due to COVID-19 and mild rhabdomyolysis, holding statin, stable right upper quadrant ultrasound, negative hepatitis panel, stable INR, trend is  improving.  HLD: Statin on hold due to transaminitis.  AKI: With reported fall at home and prolonged downtime, mild rhabdo, solved after hydration with IV fluids.     Urinary retention.  Foley and Flomax, Foley placed 02/13/2020. Discontinue Foley on 02/16/2020 and monitor.  Elevated D-dimer.  Due to intense inflammation. Detailed lower extremity ultrasound, continue moderate dose Lovenox, D-dimer is now downtrending.Marland Kitchen  HTN: On Norvasc and beta-blocker.  ACE inhibitor held due to AKI.  As needed hydralazine added.  Severe debility/deconditioning: Secondary to COVID-19 infection-await PT/OT eval. he cannot go home safely however her health insurance does not cover custodial care at SNF, I discussed this with health insurance  MD Dr. Amalia Hailey on 02/17/2020 at 7:15 in the morning. He has thoroughly reviewed patient's chart and benefits and does not think she qualifies for SNF as she is not rehab full. Family will appeal.  Hypernatremia. Resolved after hydration with D5W.     Condition - Extremely Guarded  Family Communication  :  Son [phone]) updated over the phone 02/13/20 - DNR, updated again on 02/14/20, called 7:30 in the morning on 02/17/2020. No response.  Code Status : DNR  Diet :  Diet Order            DIET SOFT Room service appropriate? No; Fluid consistency: Thin  Diet effective now                  Disposition Plan  :   Status is: Inpatient  Remains inpatient appropriate because:Inpatient level of care appropriate due to severity of illness   Dispo: The patient is from: Home              Anticipated d/c is to: TBD              Anticipated d/c date is: > 3 days              Patient currently is not medically stable to d/c.  Barriers to discharge: Complicated UTI requiring IV antibiotics-awaiting culture data, profound generalized debility/weakness-due to COVID-19 infection-needs PT eval to determine appropriate disposition.  Antimicorbials  :     Anti-infectives (From admission, onward)   Start     Dose/Rate Route Frequency Ordered Stop   02/13/20 1800  cefTRIAXone (ROCEPHIN) 1 g in sodium chloride 0.9 % 100 mL IVPB        1 g 200 mL/hr over 30 Minutes Intravenous Every 24 hours 02/13/20 0917 02/19/20 1759   02/13/20 1000  doxycycline (VIBRA-TABS) tablet 100 mg        100 mg Oral Every 12 hours 02/13/20 0917 02/18/20 0959   02/12/20 1900  cefTRIAXone (ROCEPHIN) 1 g in sodium chloride 0.9 % 100 mL IVPB  Status:  Discontinued        1 g 200 mL/hr over 30 Minutes Intravenous Every 24 hours 02/11/20 2117 02/13/20 0917   02/11/20 1830  cefTRIAXone (ROCEPHIN) 1 g in sodium chloride 0.9 % 100 mL IVPB        1 g 200 mL/hr over 30 Minutes Intravenous  Once 02/11/20 1818 02/11/20 2029      Inpatient Medications  Scheduled Meds: . amLODipine  10 mg Oral Daily  . vitamin C  500 mg Oral Daily  . Chlorhexidine Gluconate Cloth  6 each Topical Daily  . dexamethasone (DECADRON) injection  6 mg Intravenous Q24H  . doxycycline  100 mg Oral Q12H  . enoxaparin (LOVENOX) injection  40 mg Subcutaneous Q24H  . metoprolol tartrate  50 mg Oral BID  . pantoprazole sodium  40 mg Oral Daily  . tamsulosin  0.4 mg Oral Daily  . zinc sulfate  220 mg Oral Daily   Continuous Infusions: . sodium chloride    . cefTRIAXone (ROCEPHIN)  IV Stopped (02/16/20 1822)   PRN Meds:.sodium chloride, acetaminophen, albuterol, hydrALAZINE, ondansetron (ZOFRAN) IV   Time Spent in minutes  25  See all Orders from today for further details   Lala Lund M.D on 02/17/2020 at 10:23 AM  To page go to [email or web address] - use universal password  [facility] -  Office  [phone]    Objective:   Vitals:   02/16/20 1412 02/16/20 2002  02/17/20 0000 02/17/20 0400  BP: 127/65 131/64  128/62  Pulse: 63 75  (!) 58  Resp: 20 15  14   Temp: (!) 97.5 F (36.4 C) 98.5 F (36.9 C)  97.7 F (36.5 C)  TempSrc: Axillary Oral  Axillary  SpO2: 95% 95% 94% 94%   Weight:      Height:        Wt Readings from Last 3 Encounters:  02/11/20 53.1 kg  09/03/19 64 kg  04/25/16 64.6 kg     Intake/Output Summary (Last 24 hours) at 02/17/2020 1023 Last data filed at 02/17/2020 0900 Gross per 24 hour  Intake 567 ml  Output 1025 ml  Net -458 ml     Physical Exam  Awake  No new F.N deficits, Foley in place .AT,PERRAL Supple Neck,No JVD, No cervical lymphadenopathy appriciated.  Symmetrical Chest wall movement, Good air movement bilaterally, CTAB RRR,No Gallops, Rubs or new Murmurs, No Parasternal Heave +ve B.Sounds, Abd Soft, No tenderness, No organomegaly appriciated, No rebound - guarding or rigidity. No Cyanosis, Clubbing or edema, No new Rash or bruise    Data Review:    CBC Recent Labs  Lab 02/13/20 0352 02/14/20 0430 02/15/20 0409 02/16/20 0039 02/17/20 0104  WBC 11.7* 6.4 7.5 7.9 9.2  HGB 11.6* 10.9* 11.4* 11.8* 11.6*  HCT 34.2* 34.2* 35.4* 34.1* 36.3  PLT 243 210 249 269 306  MCV 86.6 89.8 88.7 85.7 87.9  MCH 29.4 28.6 28.6 29.6 28.1  MCHC 33.9 31.9 32.2 34.6 32.0  RDW 13.8 13.9 13.5 13.2 13.2  LYMPHSABS 0.6* 0.8 0.8 0.7 0.6*  MONOABS 0.2 0.3 0.3 0.3 0.6  EOSABS 0.0 0.0 0.0 0.0 0.0  BASOSABS 0.0 0.0 0.0 0.0 0.0    Chemistries  Recent Labs  Lab 02/13/20 0352 02/14/20 0430 02/15/20 0409 02/16/20 0039 02/17/20 0104  NA 141 146* 138 139 139  K 3.3* 3.4* 4.0 4.3 4.0  CL 111 114* 109 110 107  CO2 19* 22 20* 19* 21*  GLUCOSE 138* 126* 120* 124* 118*  BUN 36* 37* 35* 41* 39*  CREATININE 1.48* 1.03* 0.82 0.87 0.77  CALCIUM 8.2* 8.3* 8.6* 9.2 9.4  MG 2.2 2.3 2.1 2.0 2.1  AST 56* 192* 270* 139* 79*  ALT 33 126* 242* 199* 164*  ALKPHOS 48 44 47 53 55  BILITOT 0.4 0.5 0.5 0.6 0.5   ------------------------------------------------------------------------------------------------------------------ No results for input(s): CHOL, HDL, LDLCALC, TRIG, CHOLHDL, LDLDIRECT in the last 72 hours.  No results found for:  HGBA1C ------------------------------------------------------------------------------------------------------------------ No results for input(s): TSH, T4TOTAL, T3FREE, THYROIDAB in the last 72 hours.  Invalid input(s): FREET3 ------------------------------------------------------------------------------------------------------------------ No results for input(s): VITAMINB12, FOLATE, FERRITIN, TIBC, IRON, RETICCTPCT in the last 72 hours.  Coagulation profile Recent Labs  Lab 02/15/20 1101 02/16/20 0039 02/17/20 0104  INR 1.1 1.1 1.0    Recent Labs    02/16/20 0039 02/17/20 0104  DDIMER 1.88* 1.88*    Cardiac Enzymes No results for input(s): CKMB, TROPONINI, MYOGLOBIN in the last 168 hours.  Invalid input(s): CK ------------------------------------------------------------------------------------------------------------------    Component Value Date/Time   BNP 153.7 (H) 02/17/2020 0104    Micro Results Recent Results (from the past 240 hour(s))  Culture, blood (routine x 2)     Status: None   Collection Time: 02/11/20  4:02 PM   Specimen: BLOOD  Result Value Ref Range Status   Specimen Description BLOOD RIGHT ANTECUBITAL  Final   Special Requests   Final    BOTTLES DRAWN AEROBIC AND ANAEROBIC Blood Culture  results may not be optimal due to an inadequate volume of blood received in culture bottles   Culture   Final    NO GROWTH 5 DAYS Performed at [facility], [place] [address] [place], [place] [address]    Report Status 02/16/2020 FINAL  Final  Urine culture     Status: Abnormal   Collection Time: 02/11/20  4:52 PM   Specimen: Urine, Random  Result Value Ref Range Status   Specimen Description URINE, RANDOM  Final   Special Requests   Final    NONE Performed at [facility], [place] [address], [place], [place] [address]    Culture >=100,000 COLONIES/mL ESCHERICHIA COLI (A)  Final   Report Status 02/14/2020 FINAL  Final   Organism ID, Bacteria  ESCHERICHIA COLI (A)  Final      Susceptibility   Escherichia coli - MIC*    AMPICILLIN >=32 RESISTANT Resistant     CEFAZOLIN 16 SENSITIVE Sensitive     CEFEPIME <=0.12 SENSITIVE Sensitive     CEFTRIAXONE <=0.25 SENSITIVE Sensitive     CIPROFLOXACIN <=0.25 SENSITIVE Sensitive     GENTAMICIN <=1 SENSITIVE Sensitive     IMIPENEM 0.5 SENSITIVE Sensitive     NITROFURANTOIN <=16 SENSITIVE Sensitive     TRIMETH/SULFA <=20 SENSITIVE Sensitive     AMPICILLIN/SULBACTAM >=32 RESISTANT Resistant     PIP/TAZO <=4 SENSITIVE Sensitive     * >=100,000 COLONIES/mL ESCHERICHIA COLI  Culture, blood (routine x 2)     Status: None   Collection Time: 02/11/20  5:00 PM   Specimen: BLOOD RIGHT ARM  Result Value Ref Range Status   Specimen Description BLOOD RIGHT ARM  Final   Special Requests   Final    BOTTLES DRAWN AEROBIC ONLY Blood Culture results may not be optimal due to an inadequate volume of blood received in culture bottles   Culture   Final    NO GROWTH 5 DAYS Performed at [facility], [place] [address] [place], [place] [address]    Report Status 02/16/2020 FINAL  Final  Resp Panel by RT-PCR (Flu A&B, Covid) Nasopharyngeal Swab     Status: Abnormal   Collection Time: 02/11/20  7:21 PM   Specimen: Nasopharyngeal Swab; Nasopharyngeal(NP) swabs in vial transport medium  Result Value Ref Range Status   SARS Coronavirus 2 by RT PCR POSITIVE (A) NEGATIVE Final    Comment: RESULT CALLED TO, READ BACK BY AND VERIFIED WITH: Teofilo Pod RN 02/11/20 AT 2158 SK (NOTE) SARS-CoV-2 target nucleic acids are DETECTED.  The SARS-CoV-2 RNA is generally detectable in upper respiratory specimens during the acute phase of infection. Positive results are indicative of the presence of the identified virus, but do not rule out bacterial infection or co-infection with other pathogens not detected by the test. Clinical correlation with patient history and other diagnostic information is necessary to determine  patient infection status. The expected result is Negative.  Fact Sheet for Patients: EntrepreneurPulse.com.au  Fact Sheet for Healthcare Providers: IncredibleEmployment.be  This test is not yet approved or cleared by the [place] FDA and  has been authorized for detection and/or diagnosis of SARS-CoV-2 by FDA under an Emergency Use Authorization (EUA).  This EUA will remain in effect (meaning this test can be  used) for the duration of  the COVID-19 declaration under Section 564(b)(1) of the Act, 21 U.S.C. section 360bbb-3(b)(1), unless the authorization is terminated or revoked sooner.     Influenza A by PCR NEGATIVE NEGATIVE  Final   Influenza B by PCR NEGATIVE NEGATIVE Final    Comment: (NOTE) The Xpert Xpress SARS-CoV-2/FLU/RSV plus assay is intended as an aid in the diagnosis of influenza from Nasopharyngeal swab specimens and should not be used as a sole basis for treatment. Nasal washings and aspirates are unacceptable for Xpert Xpress SARS-CoV-2/FLU/RSV testing.  Fact Sheet for Patients: EntrepreneurPulse.com.au  Fact Sheet for Healthcare Providers: IncredibleEmployment.be  This test is not yet approved or cleared by the [place] FDA and has been authorized for detection and/or diagnosis of SARS-CoV-2 by FDA under an Emergency Use Authorization (EUA). This EUA will remain in effect (meaning this test can be used) for the duration of the COVID-19 declaration under Section 564(b)(1) of the Act, 21 U.S.C. section 360bbb-3(b)(1), unless the authorization is terminated or revoked.  Performed at [facility], [place] [address] [place], [place] [address]     Radiology Reports DG Pelvis 1-2 Views  Result Date: 02/12/2020 CLINICAL DATA:  Weakness and foul-smelling urine EXAM: PELVIS - 1 VIEW COMPARISON:  09/03/2019 FINDINGS: There is no evidence of pelvic fracture or diastasis. No pelvic  bone lesions are seen. IMPRESSION: No acute abnormality noted. Electronically Signed   By: Inez Catalina M.D.   On: 02/12/2020 02:13   CT HEAD WO CONTRAST  Result Date: 02/12/2020 CLINICAL DATA:  Weakness for 1 week EXAM: CT HEAD WITHOUT CONTRAST CT CERVICAL SPINE WITHOUT CONTRAST TECHNIQUE: Multidetector CT imaging of the head and cervical spine was performed following the standard protocol without intravenous contrast. Multiplanar CT image reconstructions of the cervical spine were also generated. COMPARISON:  None. FINDINGS: CT HEAD FINDINGS Brain: There is no mass, hemorrhage or extra-axial collection. The size and configuration of the ventricles and extra-axial CSF spaces are normal. There is hypoattenuation of the periventricular white matter, most commonly indicating chronic ischemic microangiopathy. Vascular: No abnormal hyperdensity of the major intracranial arteries or dural venous sinuses. No intracranial atherosclerosis. Skull: The visualized skull base, calvarium and extracranial soft tissues are normal. Sinuses/Orbits: No fluid levels or advanced mucosal thickening of the visualized paranasal sinuses. No mastoid or middle ear effusion. The orbits are normal. CT CERVICAL SPINE FINDINGS Alignment: No static subluxation. Facets are aligned. Occipital condyles are normally positioned. Skull base and vertebrae: No acute fracture. Soft tissues and spinal canal: No prevertebral fluid or swelling. No visible canal hematoma. Disc levels: No advanced spinal canal or neural foraminal stenosis. Upper chest: No pneumothorax, pulmonary nodule or pleural effusion. Other: Calcific aortic atherosclerosis. IMPRESSION: 1. Chronic ischemic microangiopathy without acute intracranial abnormality. 2. No acute fracture or static subluxation of the cervical spine. Aortic Atherosclerosis (ICD10-I70.0). Electronically Signed   By: Ulyses Jarred M.D.   On: 02/12/2020 01:11   CT CERVICAL SPINE WO CONTRAST  Result Date:  02/12/2020 CLINICAL DATA:  Weakness for 1 week EXAM: CT HEAD WITHOUT CONTRAST CT CERVICAL SPINE WITHOUT CONTRAST TECHNIQUE: Multidetector CT imaging of the head and cervical spine was performed following the standard protocol without intravenous contrast. Multiplanar CT image reconstructions of the cervical spine were also generated. COMPARISON:  None. FINDINGS: CT HEAD FINDINGS Brain: There is no mass, hemorrhage or extra-axial collection. The size and configuration of the ventricles and extra-axial CSF spaces are normal. There is hypoattenuation of the periventricular white matter, most commonly indicating chronic ischemic microangiopathy. Vascular: No abnormal hyperdensity of the major intracranial arteries or dural venous sinuses. No intracranial atherosclerosis. Skull: The visualized skull base, calvarium and extracranial soft tissues are normal. Sinuses/Orbits: No fluid levels or  advanced mucosal thickening of the visualized paranasal sinuses. No mastoid or middle ear effusion. The orbits are normal. CT CERVICAL SPINE FINDINGS Alignment: No static subluxation. Facets are aligned. Occipital condyles are normally positioned. Skull base and vertebrae: No acute fracture. Soft tissues and spinal canal: No prevertebral fluid or swelling. No visible canal hematoma. Disc levels: No advanced spinal canal or neural foraminal stenosis. Upper chest: No pneumothorax, pulmonary nodule or pleural effusion. Other: Calcific aortic atherosclerosis. IMPRESSION: 1. Chronic ischemic microangiopathy without acute intracranial abnormality. 2. No acute fracture or static subluxation of the cervical spine. Aortic Atherosclerosis (ICD10-I70.0). Electronically Signed   By: Ulyses Jarred M.D.   On: 02/12/2020 01:11   US RENAL  Result Date: 02/13/2020 CLINICAL DATA:  Acute kidney injury. EXAM: RENAL / URINARY TRACT ULTRASOUND COMPLETE COMPARISON:  None. FINDINGS: Right Kidney: Renal measurements: 9.9 x 4.7 x 4.9 cm = volume: 118.5 mL.  Mild renal cortical thinning. Mild to moderate hydronephrosis. No mass, no mass or stone. Normal parenchymal echogenicity. Trace amount of nonspecific perinephric edema or fluid. Left Kidney: Renal measurements: 10.2 x 4.7 x 4.3 cm = volume: 111.4 mL. Renal cortical thinning. Slight dilation of the intrarenal collecting system. Normal parenchymal echogenicity. No mass or stone. Trace amount of perinephric fluid or edema. Bladder: Distended. Small amount of dependent debris. No mass or stone. Ureteral jets not visualized. Other: None. IMPRESSION: 1. Mild to moderate right and slight left renal collecting system dilation, suspected to be on the basis of bladder distension. No renal masses or stones. 2. Renal cortical thinning, left greater than right. Electronically Signed   By: Lajean Manes M.D.   On: 02/13/2020 11:24   DG Chest Port 1 View  Result Date: 02/13/2020 CLINICAL DATA:  Shortness of breath, COVID-19 positivity EXAM: PORTABLE CHEST 1 VIEW COMPARISON:  02/11/2020 FINDINGS: Cardiac shadow is stable. Aortic calcifications are noted. The lungs are well aerated bilaterally. No focal infiltrate or sizable effusion is seen. No acute bony abnormality is seen. IMPRESSION: No acute abnormality noted. Electronically Signed   By: Inez Catalina M.D.   On: 02/13/2020 08:52   DG Chest Portable 1 View  Result Date: 02/11/2020 CLINICAL DATA:  Weakness. EXAM: PORTABLE CHEST 1 VIEW COMPARISON:  CT chest 12/0 scratch the CT chest 02/19/2019. FINDINGS: The lungs clear. Heart size normal. Aortic atherosclerosis. No pneumothorax or pleural fluid. IMPRESSION: No acute disease. Aortic Atherosclerosis (ICD10-I70.0). Electronically Signed   By: Inge Rise M.D.   On: 02/11/2020 17:46   VAS [place] LOWER EXTREMITY VENOUS (DVT)  Result Date: 02/15/2020  Lower Venous DVT Study Indications: Swelling.  Risk Factors: COVID 19 positive. Comparison Study: No prior studies. Performing Technologist: Oliver Hum RVT  Examination  Guidelines: A complete evaluation includes B-mode imaging, spectral Doppler, color Doppler, and power Doppler as needed of all accessible portions of each vessel. Bilateral testing is considered an integral part of a complete examination. Limited examinations for reoccurring indications may be performed as noted. The reflux portion of the exam is performed with the patient in reverse Trendelenburg.  +---------+---------------+---------+-----------+----------+--------------+ RIGHT    CompressibilityPhasicitySpontaneityPropertiesThrombus Aging +---------+---------------+---------+-----------+----------+--------------+ CFV      Full           Yes      Yes                                 +---------+---------------+---------+-----------+----------+--------------+ SFJ      Full                                                        +---------+---------------+---------+-----------+----------+--------------+  FV Prox  Full                                                        +---------+---------------+---------+-----------+----------+--------------+ FV Mid   Full                                                        +---------+---------------+---------+-----------+----------+--------------+ FV DistalFull                                                        +---------+---------------+---------+-----------+----------+--------------+ PFV      Full                                                        +---------+---------------+---------+-----------+----------+--------------+ POP      Full           Yes      Yes                                 +---------+---------------+---------+-----------+----------+--------------+ PTV      Full                                                        +---------+---------------+---------+-----------+----------+--------------+ PERO     Full                                                         +---------+---------------+---------+-----------+----------+--------------+   +---------+---------------+---------+-----------+----------+--------------+ LEFT     CompressibilityPhasicitySpontaneityPropertiesThrombus Aging +---------+---------------+---------+-----------+----------+--------------+ CFV      Full           Yes      Yes                                 +---------+---------------+---------+-----------+----------+--------------+ SFJ      Full                                                        +---------+---------------+---------+-----------+----------+--------------+ FV Prox  Full                                                        +---------+---------------+---------+-----------+----------+--------------+  FV Mid   Full                                                        +---------+---------------+---------+-----------+----------+--------------+ FV DistalFull                                                        +---------+---------------+---------+-----------+----------+--------------+ PFV      Full                                                        +---------+---------------+---------+-----------+----------+--------------+ POP      Full           Yes      Yes                                 +---------+---------------+---------+-----------+----------+--------------+ PTV      Full                                                        +---------+---------------+---------+-----------+----------+--------------+ PERO     Full                                                        +---------+---------------+---------+-----------+----------+--------------+     Summary: RIGHT: - There is no evidence of deep vein thrombosis in the lower extremity.  - No cystic structure found in the popliteal fossa.  LEFT: - There is no evidence of deep vein thrombosis in the lower extremity.  - No cystic structure found in the popliteal fossa.   *See table(s) above for measurements and observations. Electronically signed by Ruta Hinds MD on 02/15/2020 at 5:00:56 PM.    Final    US Abdomen Limited RUQ (LIVER/GB)  Result Date: 02/14/2020 CLINICAL DATA:  Transaminitis EXAM: ULTRASOUND ABDOMEN LIMITED RIGHT UPPER QUADRANT COMPARISON:  None. FINDINGS: Gallbladder: No gallstones or wall thickening visualized. No sonographic Murphy sign noted by sonographer. Common bile duct: Diameter: 6 mm Liver: No focal lesion identified. Within normal limits in parenchymal echogenicity. Portal vein is patent on color Doppler imaging with normal direction of blood flow towards the liver. Other: Incidentally noted is a right-sided pleural effusion. IMPRESSION: 1. No specific abnormality to explain the patient's transaminitis. 2. Incidentally noted right-sided pleural effusion. Electronically Signed   By: Constance Holster M.D.   On: 02/14/2020 19:05

## 2020-02-17 NOTE — Progress Notes (Signed)
Occupational Therapy Treatment Patient Details Name: Sylvia Petersen MRN: [id number] DOB: 10-25-1929 Today's Date: 02/17/2020    History of present illness Patient is a 86 y.o. female with PMHx of HLD, HTN-who has had progressive generalized weakness for approximately 1 week-apparently 4 prior to this hospitalization-patient sustained a mechanical fall-and was on the floor for almost 12 hours-patient was helped back to her bed by neighbors-since then she has been mostly bedbound/weak and frail-family subsequently called EMS and she was brought to the ED.   OT comments  Pt seen for OT follow up session with focus on BUE/BLE exercise and engagement in self feeding. Pt reports she has not been able to feed herself a meal since being admitted. Had pt complete UE exercises as listed below in preparation for self feeding. She then was able to apply lip balm with mod A and wipe mouth with mod A. Also had pt complete BLE exercise as listed below as preparation for eventual OOB ADL. Reviewed skin integrity precautions with pt and appropriate turning schedule and when to alert RN staff. Messaged MD and RN about ordering prevalon boots for heel cord tightness. D/c recs remain appropriate, will continue to follow.   Follow Up Recommendations  SNF    Equipment Recommendations  Wheelchair (measurements OT);Wheelchair cushion (measurements OT)    Recommendations for Other Services      Precautions / Restrictions Precautions Precautions: Fall Restrictions Weight Bearing Restrictions: No       Mobility Bed Mobility Overal bed mobility: Needs Assistance Bed Mobility: Rolling Rolling: Max assist         General bed mobility comments: use of bed pad and pillows to reposition patient with education on maintaining skin integrity  Transfers                      Balance                                           ADL either performed or assessed with clinical judgement    ADL Overall ADL's : Needs assistance/impaired Eating/Feeding: Moderate assistance;Bed level Eating/Feeding Details (indicate cue type and reason): pt having difficulty with consistent hand to mouth pattern due to weakness and poor motor control. Grooming: Moderate assistance;Bed level                                 General ADL Comments: session focused on bed level UE/LE exercises and practicing self feeding/grooming     Vision       Perception     Praxis      Cognition Arousal/Alertness: Awake/alert Behavior During Therapy: Flat affect Overall Cognitive Status: No family/caregiver present to determine baseline cognitive functioning                                 General Comments: slow to process basic ADL commands this session. Poor awareness into current deficits        Exercises Other Exercises Other Exercises: BUEs: AAROM shoulder flexion and elbow flexion x5. BLEs: hip and knee flexion, ankle x5 AAROM   Shoulder Instructions       General Comments      Pertinent Vitals/ Pain       Pain Assessment: Faces Faces Pain  Scale: Hurts little more Pain Location: L hip due to "jerking" Pain Descriptors / Indicators: Grimacing;Discomfort Pain Intervention(s): Limited activity within patient's tolerance;Monitored during session;Repositioned  Home Living                                          Prior Functioning/Environment              Frequency  Min 2X/week        Progress Toward Goals  OT Goals(current goals can now be found in the care plan section)  Progress towards OT goals: Progressing toward goals  Acute Rehab OT Goals Patient Stated Goal: be able to feed myself and get out of bed OT Goal Formulation: With patient Time For Goal Achievement: 02/28/20 Potential to Achieve Goals: [place] Discharge plan remains appropriate    Co-evaluation                 AM-PAC OT "6 Clicks" Daily  Activity     Outcome Measure   Help from another person eating meals?: A Lot Help from another person taking care of personal grooming?: A Lot Help from another person toileting, which includes using toliet, bedpan, or urinal?: Total Help from another person bathing (including washing, rinsing, drying)?: Total Help from another person to put on and taking off regular upper body clothing?: Total Help from another person to put on and taking off regular lower body clothing?: Total 6 Click Score: 8    End of Session    OT Visit Diagnosis: Muscle weakness (generalized) (M62.81);Other abnormalities of gait and mobility (R26.89);History of falling (Z91.81)   Activity Tolerance Patient tolerated treatment well   Patient Left in bed;with call bell/phone within reach;with bed alarm set   Nurse Communication Mobility status        Time: 1658-1730 OT Time Calculation (min): 32 min  Charges: OT General Charges $OT Visit: 1 Visit OT Treatments $Self Care/Home Management : 23-37 mins  Zenovia Jarred, MSOT, OTR/L [facility] Office Number: [phone] Pager: [phone]  Zenovia Jarred 02/17/2020, 6:12 PM

## 2020-02-18 LAB — CREATININE, SERUM
Creatinine, Ser: 0.81 mg/dL (ref 0.44–1.00)
GFR, Estimated: >60 mL/min (ref >60)

## 2020-02-18 LAB — PROTIME-INR
INR: 1 (ref 0.8–1.2)
Prothrombin Time: 13 seconds (ref 11.4–15.2)

## 2020-02-18 MED ORDER — TRAMADOL HCL 50 MG PO TABS
50.0000 mg | ORAL_TABLET | Freq: Four times a day (QID) | ORAL | Status: DC | PRN
  Administered 2020-02-19 – 2020-02-22 (×6): 50 mg via ORAL
  Filled 2020-02-18 (×8): qty 1

## 2020-02-18 NOTE — Progress Notes (Signed)
Physical Therapy Treatment Patient Details Name: Sylvia Petersen MRN: [id number] DOB: 1929/12/26 Today's Date: 02/18/2020    History of Present Illness Patient is a 86 y.o. female with PMHx of HLD, HTN-who has had progressive generalized weakness for approximately 1 week-apparently 4 prior to this hospitalization-patient sustained a mechanical fall-and was on the floor for almost 12 hours-patient was helped back to her bed by neighbors-since then she has been mostly bedbound/weak and frail-family subsequently called EMS and she was brought to the ED.    PT Comments    Patient up in chair (by female NT, +1 total assist per nursing and patient) on arrival. Pleasant and requesting back to bed due to discomfort (bottom and heels due to recliner; left hip chronic). Able to participate in coming to edge of chair (pulling torso forward with bil UEs, leaning torso left or right to assist with scooting pelvis forward). Utilized stedy for standing transfer back to bed with pt able to reach up to utilize grab bar with bil UEs. Required +2 total assist and use of pad under pelvis to come to stand, however pt was engaging all 4 extremities to assist. Patient asking, "when do you think I'll be able to walk again?" Patient motivated and has a good prognosis for improving with therapy.     Follow Up Recommendations  SNF     Equipment Recommendations  Rolling walker with 5" wheels;3in1 (PT);Wheelchair (measurements PT);Wheelchair cushion (measurements PT);Hospital bed    Recommendations for Other Services       Precautions / Restrictions Precautions Precautions: Fall Precaution Comments: watch for orthostatic hypotension Required Braces or Orthoses: Other Brace Other Brace: has bil prevalon boots for off-loading heels and some DF stretch    Mobility  Bed Mobility Overal bed mobility: Needs Assistance Bed Mobility: Rolling;Sit to Sidelying Rolling: Max assist (side to her back)       Sit to  sidelying: Total assist;+2 for physical assistance General bed mobility comments: pt up in recliner and uncomfortable on arrival; assisted back to bed with stedy  Transfers Overall transfer level: Needs assistance Equipment used:  (Bed pad to cradle hips; folded towel under heels for wt=bearing) Transfers: Sit to/from Stand Sit to Stand: Total assist;+2 physical assistance;+2 safety/equipment         General transfer comment: from recliner (pt able to reach UEs up to hold onto grab bar); from stedy seat  Ambulation/Gait                 Stairs             Wheelchair Mobility    Modified Rankin (Stroke Patients Only)       Balance Overall balance assessment: Needs assistance Sitting-balance support: Bilateral upper extremity supported Sitting balance-Leahy Scale: Poor Sitting balance - Comments: edge of chair close guarding without UE support Postural control: Posterior lean   Standing balance-Leahy Scale: Zero                              Cognition Arousal/Alertness: Awake/alert Behavior During Therapy: Flat affect Overall Cognitive Status: No family/caregiver present to determine baseline cognitive functioning                                 General Comments: ?slow processing vs bradykinesia due to stiffness/pain; pt with limited AROM x 4 extremities due to pain/stiffness      Exercises  Other Exercises Other Exercises: seated bil heel cord stretch with feet on floor; rt to approx -5 DF; lt to approx -20. Utilized contract relax on lt to improve ROM with ?progress to -10 DF.    General Comments General comments (skin integrity, edema, etc.): Patient positioned in Prevalon boots on return to bed for mild DF stretch and heel protection. Pt unable to fully extend knees in prevalon boots. Ultimately positioned up in chair position with pillows under her elbows for self-feeding with OT (lunch)      Pertinent Vitals/Pain Pain  Assessment: Faces Faces Pain Scale: Hurts whole lot Pain Location: L hip, bottom, bil heels (from position in recliner) Pain Descriptors / Indicators: Grimacing;Discomfort Pain Intervention(s): Monitored during session;Repositioned;Other (comment) (RN made aware; pt refused pain meds "then I'll be out!")    Home Living                      Prior Function            PT Goals (current goals can now be found in the care plan section) Acute Rehab PT Goals Patient Stated Goal: be able to feed myself and get out of bed Time For Goal Achievement: 02/28/20 Potential to Achieve Goals: Good Progress towards PT goals: Progressing toward goals    Frequency    Min 2X/week      PT Plan Current plan remains appropriate    Co-evaluation PT/OT/SLP Co-Evaluation/Treatment: Yes Reason for Co-Treatment: Complexity of the patient's impairments (multi-system involvement);For patient/therapist safety;To address functional/ADL transfers PT goals addressed during session: Mobility/safety with mobility;Balance;Strengthening/ROM        AM-PAC PT "6 Clicks" Mobility   Outcome Measure  Help needed turning from your back to your side while in a flat bed without using bedrails?: A Lot Help needed moving from lying on your back to sitting on the side of a flat bed without using bedrails?: Total Help needed moving to and from a bed to a chair (including a wheelchair)?: Total Help needed standing up from a chair using your arms (e.g., wheelchair or bedside chair)?: Total Help needed to walk in hospital room?: Total Help needed climbing 3-5 steps with a railing? : Total 6 Click Score: 7    End of Session Equipment Utilized During Treatment: Other (comment) (bed pad) Activity Tolerance: Other (comment);Patient limited by fatigue;No increased pain Patient left: in bed;Other (comment) (chair position with OT addressing self-feeding) Nurse Communication: Mobility status;Other (comment) (needs  chair cushion and heels floated above recliner legrest (pillow vs prvalon boots)) PT Visit Diagnosis: Unsteadiness on feet (R26.81);Other abnormalities of gait and mobility (R26.89);History of falling (Z91.81)     Time: 0539-7673 PT Time Calculation (min) (ACUTE ONLY): 46 min  Charges:  $Therapeutic Activity: 8-22 mins                      Arby Barrette, PT Pager [phone]    Rexanne Mano 02/18/2020, 1:40 PM

## 2020-02-18 NOTE — Progress Notes (Signed)
Occupational Therapy Treatment Patient Details Name: Sylvia Petersen MRN: [id number] DOB: 1929-09-02 Today's Date: 02/18/2020    History of present illness Patient is a 86 y.o. female with PMHx of HLD, HTN-who has had progressive generalized weakness for approximately 1 week-apparently 4 prior to this hospitalization-patient sustained a mechanical fall-and was on the floor for almost 12 hours-patient was helped back to her bed by neighbors-since then she has been mostly bedbound/weak and frail-family subsequently called EMS and she was brought to the ED.   OT comments  Pt seen for OT follow up session with focus on ADL mobility progression and self feeding. Pt up in chair on arrival, had been for several hours and was poorly positioned which was causing pain. Pt was assisted back to bed using stedy, which required total A +2 for sit <> stands in and out of stedy frame. Once back to bed, pt required max A to return to supine. She was then placed in chair position in the bed for self feeding. Pt has better success with self feeding when her elbows are propped on pillows. She was able to feed herself 2 bites in ~10 mins. She endorses being a slow eater at baseline due to needing to "clear out her esophagus". MD notified to place SLP consult. Pt prevalon boots placed and RN Staff notified. Will plan to order prafo boots for more rigid stretching for contracture prevention. D/c recs remain appropriate, will continue to follow.   Follow Up Recommendations  SNF    Equipment Recommendations  Wheelchair (measurements OT);Wheelchair cushion (measurements OT);Hospital bed    Recommendations for Other Services      Precautions / Restrictions Precautions Precautions: Fall Precaution Comments: watch for orthostatic hypotension Required Braces or Orthoses: Other Brace Other Brace: has bil prevalon boots for off-loading heels and some DF stretch Restrictions Weight Bearing Restrictions: No        Mobility Bed Mobility Overal bed mobility: Needs Assistance Bed Mobility: Rolling;Sit to Sidelying Rolling: Max assist       Sit to sidelying: Total assist;+2 for physical assistance General bed mobility comments: pt up in recliner and uncomfortable on arrival; assisted back to bed with stedy  Transfers Overall transfer level: Needs assistance Equipment used: Ambulation equipment used Transfers: Sit to/from Stand Sit to Stand: Total assist;+2 physical assistance;+2 safety/equipment         General transfer comment: from recliner (pt able to reach UEs up to hold onto grab bar); from stedy seat    Balance Overall balance assessment: Needs assistance Sitting-balance support: Bilateral upper extremity supported Sitting balance-Leahy Scale: Poor Sitting balance - Comments: edge of chair close guarding without UE support Postural control: Posterior lean   Standing balance-Leahy Scale: Zero Standing balance comment: [facility] staff support and stedy                           ADL either performed or assessed with clinical judgement   ADL Overall ADL's : Needs assistance/impaired Eating/Feeding: Minimal assistance;Bed level Eating/Feeding Details (indicate cue type and reason): pt with very slow initiation and motor planning. requires specific tray set up and elbows propped. In ~10 mins pt was able to take two bites                     Toilet Transfer: Total assistance;+2 for physical assistance;+2 for safety/equipment Toilet Transfer Details (indicate cue type and reason): use of stedy frame to rise into standing from recliner chair  Functional mobility during ADLs: Total assistance;+2 for physical assistance;+2 for safety/equipment (use of stedy) General ADL Comments: pt up in chair with NT; use of stedy back to bed     Vision       Perception     Praxis      Cognition Arousal/Alertness: Awake/alert Behavior During Therapy: Flat  affect Overall Cognitive Status: No family/caregiver present to determine baseline cognitive functioning Area of Impairment: Problem solving                             Problem Solving: Slow processing;Decreased initiation General Comments: slow processing vs bradykinesia        Exercises Exercises: Other exercises Other Exercises Other Exercises: seated bil heel cord stretch with feet on floor; rt to approx -5 DF; lt to approx -20. Utilized contract relax on lt to improve ROM with ?progress to -10 DF.   Shoulder Instructions       General Comments Patient positioned in Prevalon boots on return to bed for mild DF stretch and heel protection. Pt unable to fully extend knees in prevalon boots. Ultimately positioned up in chair position with pillows under her elbows for self-feeding with OT (lunch)    Pertinent Vitals/ Pain       Pain Assessment: Faces Faces Pain Scale: Hurts whole lot Pain Location: L hip, bottom, bil heels (from position in recliner) Pain Descriptors / Indicators: Grimacing;Discomfort Pain Intervention(s): Limited activity within patient's tolerance;Monitored during session;Repositioned  Home Living                                          Prior Functioning/Environment              Frequency  Min 2X/week        Progress Toward Goals  OT Goals(current goals can now be found in the care plan section)  Progress towards OT goals: Progressing toward goals  Acute Rehab OT Goals Patient Stated Goal: be able to feed myself and get out of bed OT Goal Formulation: With patient Time For Goal Achievement: 02/28/20 Potential to Achieve Goals: [place] Discharge plan remains appropriate    Co-evaluation    PT/OT/SLP Co-Evaluation/Treatment: Yes Reason for Co-Treatment: For patient/therapist safety;To address functional/ADL transfers PT goals addressed during session: Mobility/safety with  mobility;Balance;Strengthening/ROM OT goals addressed during session: ADL's and self-care;Proper use of Adaptive equipment and DME;Strengthening/ROM      AM-PAC OT "6 Clicks" Daily Activity     Outcome Measure   Help from another person eating meals?: A Little Help from another person taking care of personal grooming?: A Lot Help from another person toileting, which includes using toliet, bedpan, or urinal?: Total Help from another person bathing (including washing, rinsing, drying)?: Total Help from another person to put on and taking off regular upper body clothing?: Total Help from another person to put on and taking off regular lower body clothing?: Total 6 Click Score: 9    End of Session    OT Visit Diagnosis: Muscle weakness (generalized) (M62.81);Other abnormalities of gait and mobility (R26.89);History of falling (Z91.81)   Activity Tolerance Patient tolerated treatment well   Patient Left in bed;with call bell/phone within reach;with bed alarm set   Nurse Communication Mobility status        Time: 1062-6948 OT Time Calculation (min): 68 min  Charges: OT  General Charges $OT Visit: 1 Visit OT Treatments $Self Care/Home Management : 53-67 mins  Zenovia Jarred, MSOT, OTR/L Acute Rehabilitation Services [facility] Office Number: [phone] Pager: [phone]  Zenovia Jarred 02/18/2020, 2:40 PM

## 2020-02-18 NOTE — Progress Notes (Signed)
PROGRESS NOTE                                                                                                                                                                                                             Patient Demographics:    Sylvia Petersen, is a 86 y.o. female, DOB - 07-07-1929, TTS:[id number]  Outpatient Primary MD for the patient is [name], MD   Admit date - 02/11/2020   LOS - 7  Chief Complaint  Patient presents with  . Code Sepsis       Brief Narrative: Patient is a 86 y.o. female with PMHx of HLD, HTN-who has had progressive generalized weakness for approximately 1 week-apparently 4 prior to this hospitalization-patient sustained a mechanical fall-and was on the floor for almost 12 hours-patient was helped back to her bed by neighbors-since then she has been mostly bedbound/weak and frail-family subsequently called EMS and she was brought to the ED.  Upon further evaluation-she was found to have [id number]-00 infection, complicated UTI-and admitted to the hospitalist service.  See below for further details.  COVID-19 vaccinated status: Unvaccinated (claims that she thinks vaccinated people get more Covid than the unvaccinated)  Significant Events: 12/2>> Admit to [facility] for [id number] infection, complicated UTI, AKI  Significant studies: 12/2>>Chest x-ray: No active disease 12/3>> x-ray pelvis: No acute abnormality noted  COVID-19 medications: Monoclonal antibody infusion: 12/3 x 1  Antibiotics: Rocephin: 12/2>>  Microbiology data: 12/2 >>blood culture: No growth 12/2>> urine culture: Pending  Procedures: None  Consults: None  DVT prophylaxis: enoxaparin (LOVENOX) injection 40 mg Start: 02/14/20 2200    Subjective:   Patient in bed, appears comfortable, denies any headache, no fever, no chest pain or pressure, no shortness of breath , no abdominal pain. No focal weakness.    Assessment  & Plan :   Sepsis from complicated UTI and HLKTG-25 infection: Continue supportive care-follow cultures.  Afebrile-sepsis pathophysiology has completely resolved.  Pansensitive E. coli UTI: Continue Rocephin, will give 5 days.  COVID-19 infection: Despite chest x-ray being clear now CRP is creeping up and she is mildly short of breath, add 2 L oxygen and Decadron. CRP is trending down and she is now feeling much better. No need to escalate treatment. We will continue steroids only for now.   Recent Labs  Lab 02/11/20 1602 02/11/20 1921 02/11/20  2026 02/11/20 2219 02/13/20 0352 02/14/20 0430 02/14/20 0453 02/15/20 0409 02/15/20 1101 02/16/20 0039 02/17/20 0104 02/18/20 0549  WBC  --   --   --    < > 11.7* 6.4  --  7.5  --  7.9 9.2  --   HGB  --   --   --    < > 11.6* 10.9*  --  11.4*  --  11.8* 11.6*  --   HCT  --   --   --    < > 34.2* 34.2*  --  35.4*  --  34.1* 36.3  --   PLT  --   --   --    < > 243 210  --  249  --  269 306  --   CRP  --   --   --    < > 17.4* 11.9*  --  5.4*  --  3.0* 1.6*  --   BNP  --   --   --   --  170.2*  --  141.3* 159.6*  --  124.0* 153.7*  --   DDIMER  --   --   --    < > 3.35* 3.32*  --  2.26*  --  1.88* 1.88*  --   PROCALCITON  --   --   --   --  1.99 1.19  --  0.55  --  0.27 0.11  --   AST  --   --   --    < > 56* 192*  --  270*  --  139* 79*  --   ALT  --   --   --    < > 33 126*  --  242*  --  199* 164*  --   ALKPHOS  --   --   --    < > 48 44  --  47  --  53 55  --   BILITOT  --   --   --    < > 0.4 0.5  --  0.5  --  0.6 0.5  --   ALBUMIN  --   --   --    < > 2.5* 2.3*  --  2.4*  --  2.5* 2.5*  --   INR  --   --   --   --   --   --   --   --  1.1 1.1 1.0 1.0  LATICACIDVEN 1.3  --  1.6  --   --   --   --   --   --   --   --   --   SARSCOV2NAA  --  POSITIVE*  --   --   --   --   --   --   --   --   --   --    < > = values in this interval not displayed.     Transaminitis - likely due to COVID-19 and mild rhabdomyolysis, holding  statin, stable right upper quadrant ultrasound, negative hepatitis panel, stable INR, trend is improving.  HLD: Statin on hold due to transaminitis.  AKI: With reported fall at home and prolonged downtime, mild rhabdo, solved after hydration with IV fluids.     Urinary retention.  Foley and Flomax, Foley placed 02/13/2020. Discontinue Foley on 02/16/2020 and monitor.  Elevated D-dimer.  Due to intense inflammation. Detailed lower extremity ultrasound, continue moderate dose Lovenox, D-dimer is now downtrending.Marland Kitchen  HTN: On Norvasc and beta-blocker.  ACE inhibitor held due to AKI.  As needed hydralazine added.  Severe debility/deconditioning: Secondary to COVID-19 infection-await PT/OT eval. he cannot go home safely however her health insurance does not cover custodial care at SNF, I discussed this with health insurance MD Dr. Amalia Hailey on 02/17/2020 at 7:15 in the morning. He has thoroughly reviewed patient's chart and benefits and does not think she qualifies for SNF as she is not rehab full. Family will appeal.  Hypernatremia. Resolved after hydration with D5W.     Condition - Extremely Guarded  Family Communication  :  Son [phone]) updated over the phone 02/13/20 - DNR, updated again on 02/14/20, called 7:30 in the morning on 02/17/2020. No response.  Code Status : DNR  Diet :  Diet Order            DIET SOFT Room service appropriate? No; Fluid consistency: Thin  Diet effective now                  Disposition Plan  :   Status is: Inpatient  Remains inpatient appropriate because:Inpatient level of care appropriate due to severity of illness   Dispo: The patient is from: Home              Anticipated d/c is to: TBD              Anticipated d/c date is: > 3 days              Patient currently is not medically stable to d/c.  Barriers to discharge: Complicated UTI requiring IV antibiotics-awaiting culture data, profound generalized debility/weakness-due to COVID-19  infection-needs PT eval to determine appropriate disposition.  Antimicorbials  :    Anti-infectives (From admission, onward)   Start     Dose/Rate Route Frequency Ordered Stop   02/13/20 1800  cefTRIAXone (ROCEPHIN) 1 g in sodium chloride 0.9 % 100 mL IVPB        1 g 200 mL/hr over 30 Minutes Intravenous Every 24 hours 02/13/20 0917 02/19/20 1759   02/13/20 1000  doxycycline (VIBRA-TABS) tablet 100 mg        100 mg Oral Every 12 hours 02/13/20 0917 02/17/20 2030   02/12/20 1900  cefTRIAXone (ROCEPHIN) 1 g in sodium chloride 0.9 % 100 mL IVPB  Status:  Discontinued        1 g 200 mL/hr over 30 Minutes Intravenous Every 24 hours 02/11/20 2117 02/13/20 0917   02/11/20 1830  cefTRIAXone (ROCEPHIN) 1 g in sodium chloride 0.9 % 100 mL IVPB        1 g 200 mL/hr over 30 Minutes Intravenous  Once 02/11/20 1818 02/11/20 2029      Inpatient Medications  Scheduled Meds: . amLODipine  10 mg Oral Daily  . vitamin C  500 mg Oral Daily  . Chlorhexidine Gluconate Cloth  6 each Topical Daily  . dexamethasone (DECADRON) injection  6 mg Intravenous Q24H  . enoxaparin (LOVENOX) injection  40 mg Subcutaneous Q24H  . metoprolol tartrate  50 mg Oral BID  . pantoprazole sodium  40 mg Oral Daily  . tamsulosin  0.4 mg Oral Daily  . zinc sulfate  220 mg Oral Daily   Continuous Infusions: . sodium chloride    . cefTRIAXone (ROCEPHIN)  IV Stopped (02/17/20 1813)   PRN Meds:.sodium chloride, acetaminophen, albuterol, hydrALAZINE, ondansetron (ZOFRAN) IV   Time Spent in minutes  25  See all Orders from today for further details  Lala Lund M.D on 02/18/2020 at 10:47 AM  To page go to [email or web address] - use universal password  [facility] -  Office  [phone]    Objective:   Vitals:   02/17/20 0400 02/17/20 1300 02/17/20 2029 02/18/20 0524  BP: 128/62 128/67 (!) 143/70 (!) 153/73  Pulse: (!) 58 70 71 76  Resp: 14 20 18 15   Temp: 97.7 F (36.5 C) 98.3 F (36.8 C) 97.6 F (36.4  C) 97.9 F (36.6 C)  TempSrc: Axillary Oral Oral Oral  SpO2: 94% 92% 93% 93%  Weight:      Height:        Wt Readings from Last 3 Encounters:  02/11/20 53.1 kg  09/03/19 64 kg  04/25/16 64.6 kg     Intake/Output Summary (Last 24 hours) at 02/18/2020 1047 Last data filed at 02/18/2020 0533 Gross per 24 hour  Intake 185.34 ml  Output 1250 ml  Net -1064.66 ml     Physical Exam  Awake  No new F.N deficits, Foley in place [place].AT,PERRAL Supple Neck,No JVD, No cervical lymphadenopathy appriciated.  Symmetrical Chest wall movement, Good air movement bilaterally, CTAB RRR,No Gallops, Rubs or new Murmurs, No Parasternal Heave +ve B.Sounds, Abd Soft, No tenderness, No organomegaly appriciated, No rebound - guarding or rigidity. No Cyanosis, Clubbing or edema, No new Rash or bruise     Data Review:    CBC Recent Labs  Lab 02/13/20 0352 02/14/20 0430 02/15/20 0409 02/16/20 0039 02/17/20 0104  WBC 11.7* 6.4 7.5 7.9 9.2  HGB 11.6* 10.9* 11.4* 11.8* 11.6*  HCT 34.2* 34.2* 35.4* 34.1* 36.3  PLT 243 210 249 269 306  MCV 86.6 89.8 88.7 85.7 87.9  MCH 29.4 28.6 28.6 29.6 28.1  MCHC 33.9 31.9 32.2 34.6 32.0  RDW 13.8 13.9 13.5 13.2 13.2  LYMPHSABS 0.6* 0.8 0.8 0.7 0.6*  MONOABS 0.2 0.3 0.3 0.3 0.6  EOSABS 0.0 0.0 0.0 0.0 0.0  BASOSABS 0.0 0.0 0.0 0.0 0.0    Chemistries  Recent Labs  Lab 02/13/20 0352 02/14/20 0430 02/15/20 0409 02/16/20 0039 02/17/20 0104 02/18/20 0549  NA 141 146* 138 139 139  --   K 3.3* 3.4* 4.0 4.3 4.0  --   CL 111 114* 109 110 107  --   CO2 19* 22 20* 19* 21*  --   GLUCOSE 138* 126* 120* 124* 118*  --   BUN 36* 37* 35* 41* 39*  --   CREATININE 1.48* 1.03* 0.82 0.87 0.77 0.81  CALCIUM 8.2* 8.3* 8.6* 9.2 9.4  --   MG 2.2 2.3 2.1 2.0 2.1  --   AST 56* 192* 270* 139* 79*  --   ALT 33 126* 242* 199* 164*  --   ALKPHOS 48 44 47 53 55  --   BILITOT 0.4 0.5 0.5 0.6 0.5  --     ------------------------------------------------------------------------------------------------------------------ No results for input(s): CHOL, HDL, LDLCALC, TRIG, CHOLHDL, LDLDIRECT in the last 72 hours.  No results found for: HGBA1C ------------------------------------------------------------------------------------------------------------------ No results for input(s): TSH, T4TOTAL, T3FREE, THYROIDAB in the last 72 hours.  Invalid input(s): FREET3 ------------------------------------------------------------------------------------------------------------------ No results for input(s): VITAMINB12, FOLATE, FERRITIN, TIBC, IRON, RETICCTPCT in the last 72 hours.  Coagulation profile Recent Labs  Lab 02/15/20 1101 02/16/20 0039 02/17/20 0104 02/18/20 0549  INR 1.1 1.1 1.0 1.0    Recent Labs    02/16/20 0039 02/17/20 0104  DDIMER 1.88* 1.88*    Cardiac Enzymes No results for input(s): CKMB, TROPONINI, MYOGLOBIN in the last 168 hours.  Invalid input(s): CK ------------------------------------------------------------------------------------------------------------------    Component Value Date/Time   BNP 153.7 (H) 02/17/2020 0104    Micro Results Recent Results (from the past 240 hour(s))  Culture, blood (routine x 2)     Status: None   Collection Time: 02/11/20  4:02 PM   Specimen: BLOOD  Result Value Ref Range Status   Specimen Description BLOOD RIGHT ANTECUBITAL  Final   Special Requests   Final    BOTTLES DRAWN AEROBIC AND ANAEROBIC Blood Culture results may not be optimal due to an inadequate volume of blood received in culture bottles   Culture   Final    NO GROWTH 5 DAYS Performed at [facility], [place] [address], [place], [place] [address]    Report Status 02/16/2020 FINAL  Final  Urine culture     Status: Abnormal   Collection Time: 02/11/20  4:52 PM   Specimen: Urine, Random  Result Value Ref Range Status   Specimen Description URINE, RANDOM   Final   Special Requests   Final    NONE Performed at [facility], [place] [address], [place], [place] [address]    Culture >=100,000 COLONIES/mL ESCHERICHIA COLI (A)  Final   Report Status 02/14/2020 FINAL  Final   Organism ID, Bacteria ESCHERICHIA COLI (A)  Final      Susceptibility   Escherichia coli - MIC*    AMPICILLIN >=32 RESISTANT Resistant     CEFAZOLIN 16 SENSITIVE Sensitive     CEFEPIME <=0.12 SENSITIVE Sensitive     CEFTRIAXONE <=0.25 SENSITIVE Sensitive     CIPROFLOXACIN <=0.25 SENSITIVE Sensitive     GENTAMICIN <=1 SENSITIVE Sensitive     IMIPENEM 0.5 SENSITIVE Sensitive     NITROFURANTOIN <=16 SENSITIVE Sensitive     TRIMETH/SULFA <=20 SENSITIVE Sensitive     AMPICILLIN/SULBACTAM >=32 RESISTANT Resistant     PIP/TAZO <=4 SENSITIVE Sensitive     * >=100,000 COLONIES/mL ESCHERICHIA COLI  Culture, blood (routine x 2)     Status: None   Collection Time: 02/11/20  5:00 PM   Specimen: BLOOD RIGHT ARM  Result Value Ref Range Status   Specimen Description BLOOD RIGHT ARM  Final   Special Requests   Final    BOTTLES DRAWN AEROBIC ONLY Blood Culture results may not be optimal due to an inadequate volume of blood received in culture bottles   Culture   Final    NO GROWTH 5 DAYS Performed at [facility], [place] [address] [place], [place] [address]    Report Status 02/16/2020 FINAL  Final  Resp Panel by RT-PCR (Flu A&B, Covid) Nasopharyngeal Swab     Status: Abnormal   Collection Time: 02/11/20  7:21 PM   Specimen: Nasopharyngeal Swab; Nasopharyngeal(NP) swabs in vial transport medium  Result Value Ref Range Status   SARS Coronavirus 2 by RT PCR POSITIVE (A) NEGATIVE Final    Comment: RESULT CALLED TO, READ BACK BY AND VERIFIED WITH: Teofilo Pod RN 02/11/20 AT 2158 SK (NOTE) SARS-CoV-2 target nucleic acids are DETECTED.  The SARS-CoV-2 RNA is generally detectable in upper respiratory specimens during the acute phase of infection. Positive results are indicative  of the presence of the identified virus, but do not rule out bacterial infection or co-infection with other pathogens not detected by the test. Clinical correlation with patient history and other diagnostic information is necessary to determine patient infection status. The expected result is Negative.  Fact Sheet for Patients: EntrepreneurPulse.com.au  Fact Sheet for Healthcare  Providers: IncredibleEmployment.be  This test is not yet approved or cleared by the [place] and  has been authorized for detection and/or diagnosis of SARS-CoV-2 by FDA under an Emergency Use Authorization (EUA).  This EUA will remain in effect (meaning this test can be  used) for the duration of  the COVID-19 declaration under Section 564(b)(1) of the Act, 21 U.S.C. section 360bbb-3(b)(1), unless the authorization is terminated or revoked sooner.     Influenza A by PCR NEGATIVE NEGATIVE Final   Influenza B by PCR NEGATIVE NEGATIVE Final    Comment: (NOTE) The Xpert Xpress SARS-CoV-2/FLU/RSV plus assay is intended as an aid in the diagnosis of influenza from Nasopharyngeal swab specimens and should not be used as a sole basis for treatment. Nasal washings and aspirates are unacceptable for Xpert Xpress SARS-CoV-2/FLU/RSV testing.  Fact Sheet for Patients: EntrepreneurPulse.com.au  Fact Sheet for Healthcare Providers: IncredibleEmployment.be  This test is not yet approved or cleared by the [place] FDA and has been authorized for detection and/or diagnosis of SARS-CoV-2 by FDA under an Emergency Use Authorization (EUA). This EUA will remain in effect (meaning this test can be used) for the duration of the COVID-19 declaration under Section 564(b)(1) of the Act, 21 U.S.C. section 360bbb-3(b)(1), unless the authorization is terminated or revoked.  Performed at [facility], [place] [address], [place],  [place] [address]     Radiology Reports DG Pelvis 1-2 Views  Result Date: 02/12/2020 CLINICAL DATA:  Weakness and foul-smelling urine EXAM: PELVIS - 1 VIEW COMPARISON:  09/03/2019 FINDINGS: There is no evidence of pelvic fracture or diastasis. No pelvic bone lesions are seen. IMPRESSION: No acute abnormality noted. Electronically Signed   By: Inez Catalina M.D.   On: 02/12/2020 02:13   CT HEAD WO CONTRAST  Result Date: 02/12/2020 CLINICAL DATA:  Weakness for 1 week EXAM: CT HEAD WITHOUT CONTRAST CT CERVICAL SPINE WITHOUT CONTRAST TECHNIQUE: Multidetector CT imaging of the head and cervical spine was performed following the standard protocol without intravenous contrast. Multiplanar CT image reconstructions of the cervical spine were also generated. COMPARISON:  None. FINDINGS: CT HEAD FINDINGS Brain: There is no mass, hemorrhage or extra-axial collection. The size and configuration of the ventricles and extra-axial CSF spaces are normal. There is hypoattenuation of the periventricular white matter, most commonly indicating chronic ischemic microangiopathy. Vascular: No abnormal hyperdensity of the major intracranial arteries or dural venous sinuses. No intracranial atherosclerosis. Skull: The visualized skull base, calvarium and extracranial soft tissues are normal. Sinuses/Orbits: No fluid levels or advanced mucosal thickening of the visualized paranasal sinuses. No mastoid or middle ear effusion. The orbits are normal. CT CERVICAL SPINE FINDINGS Alignment: No static subluxation. Facets are aligned. Occipital condyles are normally positioned. Skull base and vertebrae: No acute fracture. Soft tissues and spinal canal: No prevertebral fluid or swelling. No visible canal hematoma. Disc levels: No advanced spinal canal or neural foraminal stenosis. Upper chest: No pneumothorax, pulmonary nodule or pleural effusion. Other: Calcific aortic atherosclerosis. IMPRESSION: 1. Chronic ischemic microangiopathy without acute  intracranial abnormality. 2. No acute fracture or static subluxation of the cervical spine. Aortic Atherosclerosis (ICD10-I70.0). Electronically Signed   By: Ulyses Jarred M.D.   On: 02/12/2020 01:11   CT CERVICAL SPINE WO CONTRAST  Result Date: 02/12/2020 CLINICAL DATA:  Weakness for 1 week EXAM: CT HEAD WITHOUT CONTRAST CT CERVICAL SPINE WITHOUT CONTRAST TECHNIQUE: Multidetector CT imaging of the head and cervical spine was performed following the standard protocol without intravenous contrast. Multiplanar CT  image reconstructions of the cervical spine were also generated. COMPARISON:  None. FINDINGS: CT HEAD FINDINGS Brain: There is no mass, hemorrhage or extra-axial collection. The size and configuration of the ventricles and extra-axial CSF spaces are normal. There is hypoattenuation of the periventricular white matter, most commonly indicating chronic ischemic microangiopathy. Vascular: No abnormal hyperdensity of the major intracranial arteries or dural venous sinuses. No intracranial atherosclerosis. Skull: The visualized skull base, calvarium and extracranial soft tissues are normal. Sinuses/Orbits: No fluid levels or advanced mucosal thickening of the visualized paranasal sinuses. No mastoid or middle ear effusion. The orbits are normal. CT CERVICAL SPINE FINDINGS Alignment: No static subluxation. Facets are aligned. Occipital condyles are normally positioned. Skull base and vertebrae: No acute fracture. Soft tissues and spinal canal: No prevertebral fluid or swelling. No visible canal hematoma. Disc levels: No advanced spinal canal or neural foraminal stenosis. Upper chest: No pneumothorax, pulmonary nodule or pleural effusion. Other: Calcific aortic atherosclerosis. IMPRESSION: 1. Chronic ischemic microangiopathy without acute intracranial abnormality. 2. No acute fracture or static subluxation of the cervical spine. Aortic Atherosclerosis (ICD10-I70.0). Electronically Signed   By: Ulyses Jarred M.D.    On: 02/12/2020 01:11   US RENAL  Result Date: 02/13/2020 CLINICAL DATA:  Acute kidney injury. EXAM: RENAL / URINARY TRACT ULTRASOUND COMPLETE COMPARISON:  None. FINDINGS: Right Kidney: Renal measurements: 9.9 x 4.7 x 4.9 cm = volume: 118.5 mL. Mild renal cortical thinning. Mild to moderate hydronephrosis. No mass, no mass or stone. Normal parenchymal echogenicity. Trace amount of nonspecific perinephric edema or fluid. Left Kidney: Renal measurements: 10.2 x 4.7 x 4.3 cm = volume: 111.4 mL. Renal cortical thinning. Slight dilation of the intrarenal collecting system. Normal parenchymal echogenicity. No mass or stone. Trace amount of perinephric fluid or edema. Bladder: Distended. Small amount of dependent debris. No mass or stone. Ureteral jets not visualized. Other: None. IMPRESSION: 1. Mild to moderate right and slight left renal collecting system dilation, suspected to be on the basis of bladder distension. No renal masses or stones. 2. Renal cortical thinning, left greater than right. Electronically Signed   By: Lajean Manes M.D.   On: 02/13/2020 11:24   DG Chest Port 1 View  Result Date: 02/13/2020 CLINICAL DATA:  Shortness of breath, COVID-19 positivity EXAM: PORTABLE CHEST 1 VIEW COMPARISON:  02/11/2020 FINDINGS: Cardiac shadow is stable. Aortic calcifications are noted. The lungs are well aerated bilaterally. No focal infiltrate or sizable effusion is seen. No acute bony abnormality is seen. IMPRESSION: No acute abnormality noted. Electronically Signed   By: Inez Catalina M.D.   On: 02/13/2020 08:52   DG Chest Portable 1 View  Result Date: 02/11/2020 CLINICAL DATA:  Weakness. EXAM: PORTABLE CHEST 1 VIEW COMPARISON:  CT chest 12/0 scratch the CT chest 02/19/2019. FINDINGS: The lungs clear. Heart size normal. Aortic atherosclerosis. No pneumothorax or pleural fluid. IMPRESSION: No acute disease. Aortic Atherosclerosis (ICD10-I70.0). Electronically Signed   By: Inge Rise M.D.   On:  02/11/2020 17:46   VAS [place] LOWER EXTREMITY VENOUS (DVT)  Result Date: 02/15/2020  Lower Venous DVT Study Indications: Swelling.  Risk Factors: COVID 19 positive. Comparison Study: No prior studies. Performing Technologist: Oliver Hum RVT  Examination Guidelines: A complete evaluation includes B-mode imaging, spectral Doppler, color Doppler, and power Doppler as needed of all accessible portions of each vessel. Bilateral testing is considered an integral part of a complete examination. Limited examinations for reoccurring indications may be performed as noted. The reflux portion of the exam is performed with  the patient in reverse Trendelenburg.  +---------+---------------+---------+-----------+----------+--------------+ RIGHT    CompressibilityPhasicitySpontaneityPropertiesThrombus Aging +---------+---------------+---------+-----------+----------+--------------+ CFV      Full           Yes      Yes                                 +---------+---------------+---------+-----------+----------+--------------+ SFJ      Full                                                        +---------+---------------+---------+-----------+----------+--------------+ FV Prox  Full                                                        +---------+---------------+---------+-----------+----------+--------------+ FV Mid   Full                                                        +---------+---------------+---------+-----------+----------+--------------+ FV DistalFull                                                        +---------+---------------+---------+-----------+----------+--------------+ PFV      Full                                                        +---------+---------------+---------+-----------+----------+--------------+ POP      Full           Yes      Yes                                  +---------+---------------+---------+-----------+----------+--------------+ PTV      Full                                                        +---------+---------------+---------+-----------+----------+--------------+ PERO     Full                                                        +---------+---------------+---------+-----------+----------+--------------+   +---------+---------------+---------+-----------+----------+--------------+ LEFT     CompressibilityPhasicitySpontaneityPropertiesThrombus Aging +---------+---------------+---------+-----------+----------+--------------+ CFV      Full           Yes      Yes                                 +---------+---------------+---------+-----------+----------+--------------+  SFJ      Full                                                        +---------+---------------+---------+-----------+----------+--------------+ FV Prox  Full                                                        +---------+---------------+---------+-----------+----------+--------------+ FV Mid   Full                                                        +---------+---------------+---------+-----------+----------+--------------+ FV DistalFull                                                        +---------+---------------+---------+-----------+----------+--------------+ PFV      Full                                                        +---------+---------------+---------+-----------+----------+--------------+ POP      Full           Yes      Yes                                 +---------+---------------+---------+-----------+----------+--------------+ PTV      Full                                                        +---------+---------------+---------+-----------+----------+--------------+ PERO     Full                                                         +---------+---------------+---------+-----------+----------+--------------+     Summary: RIGHT: - There is no evidence of deep vein thrombosis in the lower extremity.  - No cystic structure found in the popliteal fossa.  LEFT: - There is no evidence of deep vein thrombosis in the lower extremity.  - No cystic structure found in the popliteal fossa.  *See table(s) above for measurements and observations. Electronically signed by Ruta Hinds MD on 02/15/2020 at 5:00:56 PM.    Final    US Abdomen Limited RUQ (LIVER/GB)  Result Date: 02/14/2020 CLINICAL DATA:  Transaminitis EXAM: ULTRASOUND ABDOMEN LIMITED RIGHT UPPER QUADRANT COMPARISON:  None. FINDINGS: Gallbladder: No gallstones or wall thickening visualized. No  sonographic Percell Miller sign noted by sonographer. Common bile duct: Diameter: 6 mm Liver: No focal lesion identified. Within normal limits in parenchymal echogenicity. Portal vein is patent on color Doppler imaging with normal direction of blood flow towards the liver. Other: Incidentally noted is a right-sided pleural effusion. IMPRESSION: 1. No specific abnormality to explain the patient's transaminitis. 2. Incidentally noted right-sided pleural effusion. Electronically Signed   By: Constance Holster M.D.   On: 02/14/2020 19:05

## 2020-02-18 NOTE — Progress Notes (Signed)
Orthopedic Tech Progress Note Patient Details:  [name] 04/03/1929 [id number] Called order into [employer] Patient ID: Sylvia Petersen, female   DOB: 11-Apr-1929, 86 y.o.   MRN: [id number]   [name] 02/18/2020, 4:16 PM

## 2020-02-18 NOTE — TOC Progression Note (Signed)
Transition of Care [facility]) - Progression Note    Patient Details  Name: KARMIN KASPRZAK MRN: [id number] Date of Birth: [birth year]  Transition of Care [facility]) CM/SW [place], LCSW Phone Number: 02/18/2020, 1:16 PM  Clinical Narrative:    Still waiting on determination from [employer] appeal for SNF.    Expected Discharge Plan: [place] Barriers to Discharge: SNF Pending bed offer,Insurance Authorization,Continued Medical Work up  Expected Discharge Plan and Services Expected Discharge Plan: [place] In-house Referral: Clinical Social Work   Post Acute Care Choice: [place] Living arrangements for the past 2 months: Single Family Home                                       Social Determinants of Health (SDOH) Interventions    Readmission Risk Interventions No flowsheet data found.

## 2020-02-19 LAB — PROTIME-INR
INR: 1.1 (ref 0.8–1.2)
Prothrombin Time: 13.7 seconds (ref 11.4–15.2)

## 2020-02-19 NOTE — TOC Progression Note (Addendum)
Transition of Care [facility]) - Progression Note    Patient Details  Name: Sylvia Petersen MRN: [id number] Date of Birth: 1930/01/07  Transition of Care [facility]) CM/SW [place], LCSW Phone Number: 02/19/2020, 9:00 AM  Clinical Narrative:    CSW made patient's son, Sylvia Petersen, aware that [employer] has denied the first appeal and that it is now under second appeal. CSW provided [employer] with the Weekend CSW contact number once a decision is made. Confirmed [facility] is able to keep patient for home health services if patient has to return home.    Expected Discharge Plan: [place] Barriers to Discharge: SNF Pending bed offer,Insurance Authorization,Continued Medical Work up  Expected Discharge Plan and Services Expected Discharge Plan: [place] In-house Referral: Clinical Social Work   Post Acute Care Choice: [place] Living arrangements for the past 2 months: Single Family Home                                       Social Determinants of Health (SDOH) Interventions    Readmission Risk Interventions No flowsheet data found.

## 2020-02-19 NOTE — Progress Notes (Signed)
Occupational Therapy Treatment Patient Details Name: Sylvia Petersen MRN: [id number] DOB: 1929/11/13 Today's Date: 02/19/2020    History of present illness Patient is a 86 y.o. female with PMHx of HLD, HTN-who has had progressive generalized weakness for approximately 1 week-apparently 4 prior to this hospitalization-patient sustained a mechanical fall-and was on the floor for almost 12 hours-patient was helped back to her bed by neighbors-since then she has been mostly bedbound/weak and frail-family subsequently called EMS and she was brought to the ED.   OT comments  Pt seen for OT follow up session with focus on application of PRAFO boot. PRAFO boot to be worn on one foot, prevalon on other foot and switched every 4 hours. The prevalon boot is to aide in pressure relief, while the PRAFO is to assist in DF stretch to prevent contracture. RN staff education given to both RN and NT. PRAFO applied to R foor this session with success. Pt also completed x20 ankle pumps. She continues to require min A for self feeding. Also educated Conservation officer, historic buildings that pt should be up in chair for ~2 hours, have rest in bed then back up to chair throughout the day (Rather than sitting up all day long) due to poor activity tolerance and risk of pressure injury. Pt remains excellent SNF rehab candidate to progress ADL and mobility. Will continue to follow per POC.   Follow Up Recommendations  SNF;Supervision/Assistance - 24 hour    Equipment Recommendations  Wheelchair (measurements OT);Wheelchair cushion (measurements OT);Hospital bed    Recommendations for Other Services      Precautions / Restrictions Precautions Precautions: Fall Precaution Comments: watch for orthostatic hypotension Required Braces or Orthoses: Other Brace Other Brace: has bil prevalon boots for off-loading heels with one PRAFO boot to be rotated every four hours Restrictions Weight Bearing Restrictions: No       Mobility Bed Mobility                General bed mobility comments: pt up in recliner; max A for repositioning for pressure relief  Transfers                      Balance                                           ADL either performed or assessed with clinical judgement   ADL Overall ADL's : Needs assistance/impaired Eating/Feeding: Minimal assistance;Sitting Eating/Feeding Details (indicate cue type and reason): pt up in chair for self feeding. Appears to be self limiting this session                                   General ADL Comments: session mostly focused on application of PRAFO boot with education to RN staff on appropriate wearing schedule     Vision Patient Visual Report: No change from baseline     Perception     Praxis      Cognition Arousal/Alertness: Awake/alert Behavior During Therapy: Flat affect Overall Cognitive Status: No family/caregiver present to determine baseline cognitive functioning Area of Impairment: Problem solving                             Problem Solving: Slow processing;Decreased initiation General Comments: slow processing  vs bradykinesia. Is able to recall previous events with increased time        Exercises Other Exercises Other Exercises: BLE: ankle pumps x20   Shoulder Instructions       General Comments      Pertinent Vitals/ Pain       Pain Assessment: Faces Faces Pain Scale: Hurts even more Pain Location: neck from position in recliner Pain Descriptors / Indicators: Grimacing;Discomfort Pain Intervention(s): Limited activity within patient's tolerance;Monitored during session;Repositioned  Home Living                                          Prior Functioning/Environment              Frequency  Min 2X/week        Progress Toward Goals  OT Goals(current goals can now be found in the care plan section)  Progress towards OT goals: Progressing toward  goals  Acute Rehab OT Goals Patient Stated Goal: be able to feed myself and get out of bed OT Goal Formulation: With patient Time For Goal Achievement: 02/28/20 Potential to Achieve Goals: [place] Discharge plan remains appropriate    Co-evaluation                 AM-PAC OT "6 Clicks" Daily Activity     Outcome Measure   Help from another person eating meals?: A Little Help from another person taking care of personal grooming?: A Lot Help from another person toileting, which includes using toliet, bedpan, or urinal?: Total Help from another person bathing (including washing, rinsing, drying)?: Total Help from another person to put on and taking off regular upper body clothing?: Total Help from another person to put on and taking off regular lower body clothing?: Total 6 Click Score: 9    End of Session    OT Visit Diagnosis: Muscle weakness (generalized) (M62.81);Other abnormalities of gait and mobility (R26.89);History of falling (Z91.81)   Activity Tolerance Patient tolerated treatment well   Patient Left in chair;with call bell/phone within reach   Nurse Communication Mobility status        Time: 9628-3662 OT Time Calculation (min): 15 min  Charges: OT General Charges $OT Visit: 1 Visit OT Treatments $Therapeutic Activity: 8-22 mins  Zenovia Jarred, MSOT, OTR/L Acute Rehabilitation Services [facility] Office Number: [phone] Pager: [phone]  Zenovia Jarred 02/19/2020, 2:59 PM

## 2020-02-19 NOTE — Progress Notes (Signed)
PROGRESS NOTE                                                                                                                                                                                                             Patient Demographics:    Sylvia Petersen, is a 86 y.o. female, DOB - 25-Apr-1929, IHK:[id number]  Outpatient Primary MD for the patient is [name], MD   Admit date - 02/11/2020   LOS - 8  Chief Complaint  Patient presents with  . Code Sepsis       Brief Narrative: Patient is a 86 y.o. female with PMHx of HLD, HTN-who has had progressive generalized weakness for approximately 1 week-apparently 4 prior to this hospitalization-patient sustained a mechanical fall-and was on the floor for almost 12 hours-patient was helped back to her bed by neighbors-since then she has been mostly bedbound/weak and frail-family subsequently called EMS and she was brought to the ED.  Upon further evaluation-she was found to have VFIEP-32 infection, complicated UTI-and admitted to the hospitalist service.  See below for further details.  COVID-19 vaccinated status: Unvaccinated (claims that she thinks vaccinated people get more Covid than the unvaccinated)  Significant Events: 12/2>> Admit to [facility] for [id number] infection, complicated UTI, AKI  Significant studies: 12/2>>Chest x-ray: No active disease 12/3>> x-ray pelvis: No acute abnormality noted  COVID-19 medications: Monoclonal antibody infusion: 12/3 x 1  Antibiotics: Rocephin: 12/2>>  Microbiology data: 12/2 >>blood culture: No growth 12/2>> urine culture: Pending  Procedures: None  Consults: None  DVT prophylaxis: enoxaparin (LOVENOX) injection 40 mg Start: 02/14/20 2200    Subjective:   Patient in bed, appears comfortable, denies any headache, no fever, no chest pain or pressure, no shortness of breath , no abdominal pain. No focal weakness.     Assessment  & Plan :   Sepsis from complicated UTI and YSAYT-01 infection: Continue supportive care-follow cultures.  Afebrile-sepsis pathophysiology has completely resolved.  Pansensitive E. coli UTI: Continue Rocephin, will give 5 days.  COVID-19 infection: Despite chest x-ray being clear now CRP is creeping up and she is mildly short of breath, add 2 L oxygen and Decadron. CRP is trending down and she is now feeling much better. No need to escalate treatment. We will continue steroids only for now.   Recent Labs  Lab 02/13/20 0352 02/14/20 0430  02/14/20 0453 02/15/20 0409 02/15/20 1101 02/16/20 0039 02/17/20 0104 02/18/20 0549 02/19/20 0105  WBC 11.7* 6.4  --  7.5  --  7.9 9.2  --   --   HGB 11.6* 10.9*  --  11.4*  --  11.8* 11.6*  --   --   HCT 34.2* 34.2*  --  35.4*  --  34.1* 36.3  --   --   PLT 243 210  --  249  --  269 306  --   --   CRP 17.4* 11.9*  --  5.4*  --  3.0* 1.6*  --   --   BNP 170.2*  --  141.3* 159.6*  --  124.0* 153.7*  --   --   DDIMER 3.35* 3.32*  --  2.26*  --  1.88* 1.88*  --   --   PROCALCITON 1.99 1.19  --  0.55  --  0.27 0.11  --   --   AST 56* 192*  --  270*  --  139* 79*  --   --   ALT 33 126*  --  242*  --  199* 164*  --   --   ALKPHOS 48 44  --  47  --  53 55  --   --   BILITOT 0.4 0.5  --  0.5  --  0.6 0.5  --   --   ALBUMIN 2.5* 2.3*  --  2.4*  --  2.5* 2.5*  --   --   INR  --   --   --   --  1.1 1.1 1.0 1.0 1.1     Transaminitis - likely due to COVID-19 and mild rhabdomyolysis, holding statin, stable right upper quadrant ultrasound, negative hepatitis panel, stable INR, trend is improving.  HLD: Statin on hold due to transaminitis.  AKI: With reported fall at home and prolonged downtime, mild rhabdo, solved after hydration with IV fluids.     Urinary retention.  Foley and Flomax, Foley placed 02/13/2020. Discontinue Foley on 02/16/2020 and monitor.  Elevated D-dimer.  Due to intense inflammation. Detailed lower extremity ultrasound,  continue moderate dose Lovenox, D-dimer is now downtrending.Marland Kitchen  HTN: On Norvasc and beta-blocker.  ACE inhibitor held due to AKI.  As needed hydralazine added.  Severe debility/deconditioning: Secondary to COVID-19 infection-await PT/OT eval. he cannot go home safely however her health insurance does not cover custodial care at SNF, I discussed this with health insurance MD Dr. Amalia Hailey on 02/17/2020 at 7:15 in the morning. He has thoroughly reviewed patient's chart and benefits and does not think she qualifies for SNF as she is not rehab full. Family will appeal.  Hypernatremia. Resolved after hydration with D5W.     Condition - Extremely Guarded  Family Communication  :  Son [phone]) updated over the phone 02/13/20 - DNR, updated again on 02/14/20, called 7:30 in the morning on 02/17/2020. No response.  Code Status : DNR  Diet :  Diet Order            DIET SOFT Room service appropriate? No; Fluid consistency: Thin  Diet effective now                  Disposition Plan  :   Status is: Inpatient  Remains inpatient appropriate because:Inpatient level of care appropriate due to severity of illness   Dispo: The patient is from: Home              Anticipated d/c is  to: TBD              Anticipated d/c date is: > 3 days              Patient currently is not medically stable to d/c.  Barriers to discharge: Complicated UTI requiring IV antibiotics-awaiting culture data, profound generalized debility/weakness-due to COVID-19 infection-needs PT eval to determine appropriate disposition.  Antimicorbials  :    Anti-infectives (From admission, onward)   Start     Dose/Rate Route Frequency Ordered Stop   02/13/20 1800  cefTRIAXone (ROCEPHIN) 1 g in sodium chloride 0.9 % 100 mL IVPB        1 g 200 mL/hr over 30 Minutes Intravenous Every 24 hours 02/13/20 0917 02/18/20 1809   02/13/20 1000  doxycycline (VIBRA-TABS) tablet 100 mg        100 mg Oral Every 12 hours 02/13/20 0917  02/17/20 2030   02/12/20 1900  cefTRIAXone (ROCEPHIN) 1 g in sodium chloride 0.9 % 100 mL IVPB  Status:  Discontinued        1 g 200 mL/hr over 30 Minutes Intravenous Every 24 hours 02/11/20 2117 02/13/20 0917   02/11/20 1830  cefTRIAXone (ROCEPHIN) 1 g in sodium chloride 0.9 % 100 mL IVPB        1 g 200 mL/hr over 30 Minutes Intravenous  Once 02/11/20 1818 02/11/20 2029      Inpatient Medications  Scheduled Meds: . amLODipine  10 mg Oral Daily  . vitamin C  500 mg Oral Daily  . Chlorhexidine Gluconate Cloth  6 each Topical Daily  . enoxaparin (LOVENOX) injection  40 mg Subcutaneous Q24H  . metoprolol tartrate  50 mg Oral BID  . pantoprazole sodium  40 mg Oral Daily  . tamsulosin  0.4 mg Oral Daily  . zinc sulfate  220 mg Oral Daily   Continuous Infusions: . sodium chloride     PRN Meds:.sodium chloride, acetaminophen, albuterol, hydrALAZINE, ondansetron (ZOFRAN) IV, traMADol   Time Spent in minutes  25  See all Orders from today for further details   Lala Lund M.D on 02/19/2020 at 9:57 AM  To page go to [email or web address] - use universal password  [facility] -  Office  [phone]    Objective:   Vitals:   02/18/20 2000 02/18/20 2057 02/19/20 0000 02/19/20 0400  BP: 126/60 126/64 (!) 117/58 (!) 142/61  Pulse: 79 70 62 69  Resp: 18 15 15 16   Temp:  97.6 F (36.4 C)    TempSrc:  Oral    SpO2: 98% 97% 96% 95%  Weight:      Height:        Wt Readings from Last 3 Encounters:  02/11/20 53.1 kg  09/03/19 64 kg  04/25/16 64.6 kg     Intake/Output Summary (Last 24 hours) at 02/19/2020 0957 Last data filed at 02/19/2020 0100 Gross per 24 hour  Intake 125.08 ml  Output 955 ml  Net -829.92 ml     Physical Exam  Awake, mildly confused, No new F.N deficits, Foley in place, bilateral pro-form boots [place].AT,PERRAL Supple Neck,No JVD, No cervical lymphadenopathy appriciated.  Symmetrical Chest wall movement, Good air movement bilaterally, CTAB RRR,No  Gallops, Rubs or new Murmurs, No Parasternal Heave +ve B.Sounds, Abd Soft, No tenderness, No organomegaly appriciated, No rebound - guarding or rigidity. No Cyanosis      Data Review:    CBC Recent Labs  Lab 02/13/20 0352 02/14/20 0430 02/15/20 0409 02/16/20 0039 02/17/20 0104  WBC 11.7* 6.4 7.5 7.9 9.2  HGB 11.6* 10.9* 11.4* 11.8* 11.6*  HCT 34.2* 34.2* 35.4* 34.1* 36.3  PLT 243 210 249 269 306  MCV 86.6 89.8 88.7 85.7 87.9  MCH 29.4 28.6 28.6 29.6 28.1  MCHC 33.9 31.9 32.2 34.6 32.0  RDW 13.8 13.9 13.5 13.2 13.2  LYMPHSABS 0.6* 0.8 0.8 0.7 0.6*  MONOABS 0.2 0.3 0.3 0.3 0.6  EOSABS 0.0 0.0 0.0 0.0 0.0  BASOSABS 0.0 0.0 0.0 0.0 0.0    Chemistries  Recent Labs  Lab 02/13/20 0352 02/14/20 0430 02/15/20 0409 02/16/20 0039 02/17/20 0104 02/18/20 0549  NA 141 146* 138 139 139  --   K 3.3* 3.4* 4.0 4.3 4.0  --   CL 111 114* 109 110 107  --   CO2 19* 22 20* 19* 21*  --   GLUCOSE 138* 126* 120* 124* 118*  --   BUN 36* 37* 35* 41* 39*  --   CREATININE 1.48* 1.03* 0.82 0.87 0.77 0.81  CALCIUM 8.2* 8.3* 8.6* 9.2 9.4  --   MG 2.2 2.3 2.1 2.0 2.1  --   AST 56* 192* 270* 139* 79*  --   ALT 33 126* 242* 199* 164*  --   ALKPHOS 48 44 47 53 55  --   BILITOT 0.4 0.5 0.5 0.6 0.5  --    ------------------------------------------------------------------------------------------------------------------ No results for input(s): CHOL, HDL, LDLCALC, TRIG, CHOLHDL, LDLDIRECT in the last 72 hours.  No results found for: HGBA1C ------------------------------------------------------------------------------------------------------------------ No results for input(s): TSH, T4TOTAL, T3FREE, THYROIDAB in the last 72 hours.  Invalid input(s): FREET3 ------------------------------------------------------------------------------------------------------------------ No results for input(s): VITAMINB12, FOLATE, FERRITIN, TIBC, IRON, RETICCTPCT in the last 72 hours.  Coagulation  profile Recent Labs  Lab 02/15/20 1101 02/16/20 0039 02/17/20 0104 02/18/20 0549 02/19/20 0105  INR 1.1 1.1 1.0 1.0 1.1    Recent Labs    02/17/20 0104  DDIMER 1.88*    Cardiac Enzymes No results for input(s): CKMB, TROPONINI, MYOGLOBIN in the last 168 hours.  Invalid input(s): CK ------------------------------------------------------------------------------------------------------------------    Component Value Date/Time   BNP 153.7 (H) 02/17/2020 0104    Micro Results Recent Results (from the past 240 hour(s))  Culture, blood (routine x 2)     Status: None   Collection Time: 02/11/20  4:02 PM   Specimen: BLOOD  Result Value Ref Range Status   Specimen Description BLOOD RIGHT ANTECUBITAL  Final   Special Requests   Final    BOTTLES DRAWN AEROBIC AND ANAEROBIC Blood Culture results may not be optimal due to an inadequate volume of blood received in culture bottles   Culture   Final    NO GROWTH 5 DAYS Performed at [facility], [place] [address], [place], [place] [address]    Report Status 02/16/2020 FINAL  Final  Urine culture     Status: Abnormal   Collection Time: 02/11/20  4:52 PM   Specimen: Urine, Random  Result Value Ref Range Status   Specimen Description URINE, RANDOM  Final   Special Requests   Final    NONE Performed at [facility], [place] [address] [place], [place] [address]    Culture >=100,000 COLONIES/mL ESCHERICHIA COLI (A)  Final   Report Status 02/14/2020 FINAL  Final   Organism ID, Bacteria ESCHERICHIA COLI (A)  Final      Susceptibility   Escherichia coli - MIC*    AMPICILLIN >=32 RESISTANT Resistant     CEFAZOLIN 16 SENSITIVE Sensitive  CEFEPIME <=0.12 SENSITIVE Sensitive     CEFTRIAXONE <=0.25 SENSITIVE Sensitive     CIPROFLOXACIN <=0.25 SENSITIVE Sensitive     GENTAMICIN <=1 SENSITIVE Sensitive     IMIPENEM 0.5 SENSITIVE Sensitive     NITROFURANTOIN <=16 SENSITIVE Sensitive     TRIMETH/SULFA <=20 SENSITIVE Sensitive      AMPICILLIN/SULBACTAM >=32 RESISTANT Resistant     PIP/TAZO <=4 SENSITIVE Sensitive     * >=100,000 COLONIES/mL ESCHERICHIA COLI  Culture, blood (routine x 2)     Status: None   Collection Time: 02/11/20  5:00 PM   Specimen: BLOOD RIGHT ARM  Result Value Ref Range Status   Specimen Description BLOOD RIGHT ARM  Final   Special Requests   Final    BOTTLES DRAWN AEROBIC ONLY Blood Culture results may not be optimal due to an inadequate volume of blood received in culture bottles   Culture   Final    NO GROWTH 5 DAYS Performed at [facility], [place] [address], [place], [place] [address]    Report Status 02/16/2020 FINAL  Final  Resp Panel by RT-PCR (Flu A&B, Covid) Nasopharyngeal Swab     Status: Abnormal   Collection Time: 02/11/20  7:21 PM   Specimen: Nasopharyngeal Swab; Nasopharyngeal(NP) swabs in vial transport medium  Result Value Ref Range Status   SARS Coronavirus 2 by RT PCR POSITIVE (A) NEGATIVE Final    Comment: RESULT CALLED TO, READ BACK BY AND VERIFIED WITH: Teofilo Pod RN 02/11/20 AT 2158 SK (NOTE) SARS-CoV-2 target nucleic acids are DETECTED.  The SARS-CoV-2 RNA is generally detectable in upper respiratory specimens during the acute phase of infection. Positive results are indicative of the presence of the identified virus, but do not rule out bacterial infection or co-infection with other pathogens not detected by the test. Clinical correlation with patient history and other diagnostic information is necessary to determine patient infection status. The expected result is Negative.  Fact Sheet for Patients: EntrepreneurPulse.com.au  Fact Sheet for Healthcare Providers: IncredibleEmployment.be  This test is not yet approved or cleared by the [place] FDA and  has been authorized for detection and/or diagnosis of SARS-CoV-2 by FDA under an Emergency Use Authorization (EUA).  This EUA will remain in effect (meaning this  test can be  used) for the duration of  the COVID-19 declaration under Section 564(b)(1) of the Act, 21 U.S.C. section 360bbb-3(b)(1), unless the authorization is terminated or revoked sooner.     Influenza A by PCR NEGATIVE NEGATIVE Final   Influenza B by PCR NEGATIVE NEGATIVE Final    Comment: (NOTE) The Xpert Xpress SARS-CoV-2/FLU/RSV plus assay is intended as an aid in the diagnosis of influenza from Nasopharyngeal swab specimens and should not be used as a sole basis for treatment. Nasal washings and aspirates are unacceptable for Xpert Xpress SARS-CoV-2/FLU/RSV testing.  Fact Sheet for Patients: EntrepreneurPulse.com.au  Fact Sheet for Healthcare Providers: IncredibleEmployment.be  This test is not yet approved or cleared by the [place] FDA and has been authorized for detection and/or diagnosis of SARS-CoV-2 by FDA under an Emergency Use Authorization (EUA). This EUA will remain in effect (meaning this test can be used) for the duration of the COVID-19 declaration under Section 564(b)(1) of the Act, 21 U.S.C. section 360bbb-3(b)(1), unless the authorization is terminated or revoked.  Performed at [facility], [place] [address], [place],  [address]     Radiology Reports DG Pelvis 1-2 Views  Result Date: 02/12/2020 CLINICAL DATA:  Weakness and  foul-smelling urine EXAM: PELVIS - 1 VIEW COMPARISON:  09/03/2019 FINDINGS: There is no evidence of pelvic fracture or diastasis. No pelvic bone lesions are seen. IMPRESSION: No acute abnormality noted. Electronically Signed   By: Inez Catalina M.D.   On: 02/12/2020 02:13   CT HEAD WO CONTRAST  Result Date: 02/12/2020 CLINICAL DATA:  Weakness for 1 week EXAM: CT HEAD WITHOUT CONTRAST CT CERVICAL SPINE WITHOUT CONTRAST TECHNIQUE: Multidetector CT imaging of the head and cervical spine was performed following the standard protocol without intravenous contrast. Multiplanar CT image  reconstructions of the cervical spine were also generated. COMPARISON:  None. FINDINGS: CT HEAD FINDINGS Brain: There is no mass, hemorrhage or extra-axial collection. The size and configuration of the ventricles and extra-axial CSF spaces are normal. There is hypoattenuation of the periventricular white matter, most commonly indicating chronic ischemic microangiopathy. Vascular: No abnormal hyperdensity of the major intracranial arteries or dural venous sinuses. No intracranial atherosclerosis. Skull: The visualized skull base, calvarium and extracranial soft tissues are normal. Sinuses/Orbits: No fluid levels or advanced mucosal thickening of the visualized paranasal sinuses. No mastoid or middle ear effusion. The orbits are normal. CT CERVICAL SPINE FINDINGS Alignment: No static subluxation. Facets are aligned. Occipital condyles are normally positioned. Skull base and vertebrae: No acute fracture. Soft tissues and spinal canal: No prevertebral fluid or swelling. No visible canal hematoma. Disc levels: No advanced spinal canal or neural foraminal stenosis. Upper chest: No pneumothorax, pulmonary nodule or pleural effusion. Other: Calcific aortic atherosclerosis. IMPRESSION: 1. Chronic ischemic microangiopathy without acute intracranial abnormality. 2. No acute fracture or static subluxation of the cervical spine. Aortic Atherosclerosis (ICD10-I70.0). Electronically Signed   By: Ulyses Jarred M.D.   On: 02/12/2020 01:11   CT CERVICAL SPINE WO CONTRAST  Result Date: 02/12/2020 CLINICAL DATA:  Weakness for 1 week EXAM: CT HEAD WITHOUT CONTRAST CT CERVICAL SPINE WITHOUT CONTRAST TECHNIQUE: Multidetector CT imaging of the head and cervical spine was performed following the standard protocol without intravenous contrast. Multiplanar CT image reconstructions of the cervical spine were also generated. COMPARISON:  None. FINDINGS: CT HEAD FINDINGS Brain: There is no mass, hemorrhage or extra-axial collection. The size  and configuration of the ventricles and extra-axial CSF spaces are normal. There is hypoattenuation of the periventricular white matter, most commonly indicating chronic ischemic microangiopathy. Vascular: No abnormal hyperdensity of the major intracranial arteries or dural venous sinuses. No intracranial atherosclerosis. Skull: The visualized skull base, calvarium and extracranial soft tissues are normal. Sinuses/Orbits: No fluid levels or advanced mucosal thickening of the visualized paranasal sinuses. No mastoid or middle ear effusion. The orbits are normal. CT CERVICAL SPINE FINDINGS Alignment: No static subluxation. Facets are aligned. Occipital condyles are normally positioned. Skull base and vertebrae: No acute fracture. Soft tissues and spinal canal: No prevertebral fluid or swelling. No visible canal hematoma. Disc levels: No advanced spinal canal or neural foraminal stenosis. Upper chest: No pneumothorax, pulmonary nodule or pleural effusion. Other: Calcific aortic atherosclerosis. IMPRESSION: 1. Chronic ischemic microangiopathy without acute intracranial abnormality. 2. No acute fracture or static subluxation of the cervical spine. Aortic Atherosclerosis (ICD10-I70.0). Electronically Signed   By: Ulyses Jarred M.D.   On: 02/12/2020 01:11   US RENAL  Result Date: 02/13/2020 CLINICAL DATA:  Acute kidney injury. EXAM: RENAL / URINARY TRACT ULTRASOUND COMPLETE COMPARISON:  None. FINDINGS: Right Kidney: Renal measurements: 9.9 x 4.7 x 4.9 cm = volume: 118.5 mL. Mild renal cortical thinning. Mild to moderate hydronephrosis. No mass, no mass or stone. Normal parenchymal echogenicity.  Trace amount of nonspecific perinephric edema or fluid. Left Kidney: Renal measurements: 10.2 x 4.7 x 4.3 cm = volume: 111.4 mL. Renal cortical thinning. Slight dilation of the intrarenal collecting system. Normal parenchymal echogenicity. No mass or stone. Trace amount of perinephric fluid or edema. Bladder: Distended. Small  amount of dependent debris. No mass or stone. Ureteral jets not visualized. Other: None. IMPRESSION: 1. Mild to moderate right and slight left renal collecting system dilation, suspected to be on the basis of bladder distension. No renal masses or stones. 2. Renal cortical thinning, left greater than right. Electronically Signed   By: Lajean Manes M.D.   On: 02/13/2020 11:24   DG Chest Port 1 View  Result Date: 02/13/2020 CLINICAL DATA:  Shortness of breath, COVID-19 positivity EXAM: PORTABLE CHEST 1 VIEW COMPARISON:  02/11/2020 FINDINGS: Cardiac shadow is stable. Aortic calcifications are noted. The lungs are well aerated bilaterally. No focal infiltrate or sizable effusion is seen. No acute bony abnormality is seen. IMPRESSION: No acute abnormality noted. Electronically Signed   By: Inez Catalina M.D.   On: 02/13/2020 08:52   DG Chest Portable 1 View  Result Date: 02/11/2020 CLINICAL DATA:  Weakness. EXAM: PORTABLE CHEST 1 VIEW COMPARISON:  CT chest 12/0 scratch the CT chest 02/19/2019. FINDINGS: The lungs clear. Heart size normal. Aortic atherosclerosis. No pneumothorax or pleural fluid. IMPRESSION: No acute disease. Aortic Atherosclerosis (ICD10-I70.0). Electronically Signed   By: Inge Rise M.D.   On: 02/11/2020 17:46   VAS [place] LOWER EXTREMITY VENOUS (DVT)  Result Date: 02/15/2020  Lower Venous DVT Study Indications: Swelling.  Risk Factors: COVID 19 positive. Comparison Study: No prior studies. Performing Technologist: Oliver Hum RVT  Examination Guidelines: A complete evaluation includes B-mode imaging, spectral Doppler, color Doppler, and power Doppler as needed of all accessible portions of each vessel. Bilateral testing is considered an integral part of a complete examination. Limited examinations for reoccurring indications may be performed as noted. The reflux portion of the exam is performed with the patient in reverse Trendelenburg.   +---------+---------------+---------+-----------+----------+--------------+ RIGHT    CompressibilityPhasicitySpontaneityPropertiesThrombus Aging +---------+---------------+---------+-----------+----------+--------------+ CFV      Full           Yes      Yes                                 +---------+---------------+---------+-----------+----------+--------------+ SFJ      Full                                                        +---------+---------------+---------+-----------+----------+--------------+ FV Prox  Full                                                        +---------+---------------+---------+-----------+----------+--------------+ FV Mid   Full                                                        +---------+---------------+---------+-----------+----------+--------------+ FV  DistalFull                                                        +---------+---------------+---------+-----------+----------+--------------+ PFV      Full                                                        +---------+---------------+---------+-----------+----------+--------------+ POP      Full           Yes      Yes                                 +---------+---------------+---------+-----------+----------+--------------+ PTV      Full                                                        +---------+---------------+---------+-----------+----------+--------------+ PERO     Full                                                        +---------+---------------+---------+-----------+----------+--------------+   +---------+---------------+---------+-----------+----------+--------------+ LEFT     CompressibilityPhasicitySpontaneityPropertiesThrombus Aging +---------+---------------+---------+-----------+----------+--------------+ CFV      Full           Yes      Yes                                  +---------+---------------+---------+-----------+----------+--------------+ SFJ      Full                                                        +---------+---------------+---------+-----------+----------+--------------+ FV Prox  Full                                                        +---------+---------------+---------+-----------+----------+--------------+ FV Mid   Full                                                        +---------+---------------+---------+-----------+----------+--------------+ FV DistalFull                                                        +---------+---------------+---------+-----------+----------+--------------+  PFV      Full                                                        +---------+---------------+---------+-----------+----------+--------------+ POP      Full           Yes      Yes                                 +---------+---------------+---------+-----------+----------+--------------+ PTV      Full                                                        +---------+---------------+---------+-----------+----------+--------------+ PERO     Full                                                        +---------+---------------+---------+-----------+----------+--------------+     Summary: RIGHT: - There is no evidence of deep vein thrombosis in the lower extremity.  - No cystic structure found in the popliteal fossa.  LEFT: - There is no evidence of deep vein thrombosis in the lower extremity.  - No cystic structure found in the popliteal fossa.  *See table(s) above for measurements and observations. Electronically signed by Ruta Hinds MD on 02/15/2020 at 5:00:56 PM.    Final    US Abdomen Limited RUQ (LIVER/GB)  Result Date: 02/14/2020 CLINICAL DATA:  Transaminitis EXAM: ULTRASOUND ABDOMEN LIMITED RIGHT UPPER QUADRANT COMPARISON:  None. FINDINGS: Gallbladder: No gallstones or wall thickening visualized. No  sonographic Murphy sign noted by sonographer. Common bile duct: Diameter: 6 mm Liver: No focal lesion identified. Within normal limits in parenchymal echogenicity. Portal vein is patent on color Doppler imaging with normal direction of blood flow towards the liver. Other: Incidentally noted is a right-sided pleural effusion. IMPRESSION: 1. No specific abnormality to explain the patient's transaminitis. 2. Incidentally noted right-sided pleural effusion. Electronically Signed   By: Constance Holster M.D.   On: 02/14/2020 19:05

## 2020-02-20 LAB — PROTIME-INR
INR: 1.1 (ref 0.8–1.2)
Prothrombin Time: 13.5 seconds (ref 11.4–15.2)

## 2020-02-20 NOTE — Progress Notes (Signed)
PROGRESS NOTE                                                                                                                                                                                                             Patient Demographics:    Sylvia Petersen, is a 86 y.o. female, DOB - 02-Aug-1929, ZMO:[id number]  Outpatient Primary MD for the patient is [name], MD   Admit date - 02/11/2020   LOS - 9  Chief Complaint  Patient presents with  . Code Sepsis       Brief Narrative: Patient is a 86 y.o. female with PMHx of HLD, HTN-who has had progressive generalized weakness for approximately 1 week-apparently 4 prior to this hospitalization-patient sustained a mechanical fall-and was on the floor for almost 12 hours-patient was helped back to her bed by neighbors-since then she has been mostly bedbound/weak and frail-family subsequently called EMS and she was brought to the ED.  Upon further evaluation-she was found to have KPTWS-56 infection, complicated UTI-and admitted to the hospitalist service.  See below for further details.  COVID-19 vaccinated status: Unvaccinated (claims that she thinks vaccinated people get more Covid than the unvaccinated)  Significant Events: 12/2>> Admit to [facility] for CLEXN-17 infection, complicated UTI, AKI  Significant studies: 12/2>>Chest x-ray: No active disease 12/3>> x-ray pelvis: No acute abnormality noted  COVID-19 medications: Monoclonal antibody infusion: 12/3 x 1  Antibiotics: Rocephin: 12/2>>  Microbiology data: 12/2 >>blood culture: No growth 12/2>> urine culture: Pending  Procedures: None  Consults: None  DVT prophylaxis: enoxaparin (LOVENOX) injection 40 mg Start: 02/14/20 2200    Subjective:   In bed appears to be in no distress, she denies any headache chest or abdominal pain, minimal foot discomfort when she moves out of bed, says this has been there for a  while.    Assessment  & Plan :   Sepsis from complicated UTI and GYFVC-94 infection: Continue supportive care-follow cultures.  Afebrile-sepsis pathophysiology has completely resolved.  Pansensitive E. coli UTI: Continue Rocephin x 5 days.  COVID-19 infection: she had very mild disease with elevated CRP only, was treated with a course of steroids which she has finished remained stable and no hypoxia.   Recent Labs  Lab 02/14/20 0430 02/14/20 4967 02/15/20 0409 02/15/20 1101 02/16/20 0039 02/17/20 0104 02/18/20 5916 02/19/20 0105 02/20/20 3846  WBC 6.4  --  7.5  --  7.9 9.2  --   --   --   HGB 10.9*  --  11.4*  --  11.8* 11.6*  --   --   --   HCT 34.2*  --  35.4*  --  34.1* 36.3  --   --   --   PLT 210  --  249  --  269 306  --   --   --   CRP 11.9*  --  5.4*  --  3.0* 1.6*  --   --   --   BNP  --  141.3* 159.6*  --  124.0* 153.7*  --   --   --   DDIMER 3.32*  --  2.26*  --  1.88* 1.88*  --   --   --   PROCALCITON 1.19  --  0.55  --  0.27 0.11  --   --   --   AST 192*  --  270*  --  139* 79*  --   --   --   ALT 126*  --  242*  --  199* 164*  --   --   --   ALKPHOS 44  --  47  --  53 55  --   --   --   BILITOT 0.5  --  0.5  --  0.6 0.5  --   --   --   ALBUMIN 2.3*  --  2.4*  --  2.5* 2.5*  --   --   --   INR  --   --   --    < > 1.1 1.0 1.0 1.1 1.1   < > = values in this interval not displayed.     Transaminitis - likely due to COVID-19 and mild rhabdomyolysis, holding statin, stable right upper quadrant ultrasound, negative hepatitis panel, stable INR, trend is improving.  HLD: Statin on hold due to transaminitis.  AKI: With reported fall at home and prolonged downtime, mild rhabdo, solved after hydration with IV fluids.     Urinary retention.  Foley and Flomax, Foley placed 02/13/2020. Discontinue Foley on 02/16/2020 and monitor.  Elevated D-dimer.  Due to intense inflammation. Detailed lower extremity ultrasound, continue moderate dose Lovenox, D-dimer is now  downtrending.Marland Kitchen  HTN: On Norvasc and beta-blocker.  ACE inhibitor held due to AKI.  As needed hydralazine added.  Severe debility/deconditioning: Secondary to COVID-19 infection-await PT/OT eval. he cannot go home safely however her health insurance does not cover custodial care at SNF, I discussed this with health insurance MD Dr. Amalia Hailey on 02/17/2020 at 7:15 in the morning. He has thoroughly reviewed patient's chart and benefits and does not think she qualifies for SNF as she is not rehab full. Family will appeal.  Hypernatremia. Resolved after hydration with D5W.     Condition - Extremely Guarded  Family Communication  :  Son [phone]) updated over the phone 02/13/20 - DNR, updated again on 02/14/20, called 7:30 in the morning on 02/17/2020. No response, 02/20/20 at 10:10 AM message left.  Code Status : DNR  Diet :  Diet Order            DIET SOFT Room service appropriate? No; Fluid consistency: Thin  Diet effective now                  Disposition Plan  :   Status is: Inpatient  Remains inpatient appropriate because:Inpatient level of care appropriate  due to severity of illness   Dispo: The patient is from: Home              Anticipated d/c is to: TBD              Anticipated d/c date is: > 3 days              Patient currently is not medically stable to d/c.  Barriers to discharge: Complicated UTI requiring IV antibiotics-awaiting culture data, profound generalized debility/weakness-due to COVID-19 infection-needs PT eval to determine appropriate disposition.  Antimicorbials  :    Anti-infectives (From admission, onward)   Start     Dose/Rate Route Frequency Ordered Stop   02/13/20 1800  cefTRIAXone (ROCEPHIN) 1 g in sodium chloride 0.9 % 100 mL IVPB        1 g 200 mL/hr over 30 Minutes Intravenous Every 24 hours 02/13/20 0917 02/18/20 1809   02/13/20 1000  doxycycline (VIBRA-TABS) tablet 100 mg        100 mg Oral Every 12 hours 02/13/20 0917 02/17/20 2030    02/12/20 1900  cefTRIAXone (ROCEPHIN) 1 g in sodium chloride 0.9 % 100 mL IVPB  Status:  Discontinued        1 g 200 mL/hr over 30 Minutes Intravenous Every 24 hours 02/11/20 2117 02/13/20 0917   02/11/20 1830  cefTRIAXone (ROCEPHIN) 1 g in sodium chloride 0.9 % 100 mL IVPB        1 g 200 mL/hr over 30 Minutes Intravenous  Once 02/11/20 1818 02/11/20 2029      Inpatient Medications  Scheduled Meds: . amLODipine  10 mg Oral Daily  . vitamin C  500 mg Oral Daily  . Chlorhexidine Gluconate Cloth  6 each Topical Daily  . enoxaparin (LOVENOX) injection  40 mg Subcutaneous Q24H  . metoprolol tartrate  50 mg Oral BID  . pantoprazole sodium  40 mg Oral Daily  . tamsulosin  0.4 mg Oral Daily  . zinc sulfate  220 mg Oral Daily   Continuous Infusions: . sodium chloride     PRN Meds:.sodium chloride, acetaminophen, albuterol, hydrALAZINE, ondansetron (ZOFRAN) IV, traMADol   Time Spent in minutes  25  See all Orders from today for further details   Lala Lund M.D on 02/20/2020 at 10:04 AM  To page go to [email or web address] - use universal password  [facility] -  Office  [phone]    Objective:   Vitals:   02/19/20 0400 02/19/20 1623 02/19/20 2129 02/20/20 0510  BP: (!) 142/61 (!) 116/58 137/63 (!) 153/77  Pulse: 69 70 68 67  Resp: 16 15 14 12   Temp:  97.9 F (36.6 C) 98.2 F (36.8 C) 98 F (36.7 C)  TempSrc:  Oral Oral Oral  SpO2: 95% 97% 96% 98%  Weight:      Height:        Wt Readings from Last 3 Encounters:  02/11/20 53.1 kg  09/03/19 64 kg  04/25/16 64.6 kg     Intake/Output Summary (Last 24 hours) at 02/20/2020 1004 Last data filed at 02/20/2020 0916 Gross per 24 hour  Intake 120 ml  Output 1225 ml  Net -1105 ml     Physical Exam  Awake, mildly confused, No new F.N deficits, Foley in place, bilateral pro-form boots .AT,PERRAL Supple Neck,No JVD, No cervical lymphadenopathy appriciated.  Symmetrical Chest wall movement, Good air movement  bilaterally, CTAB RRR,No Gallops, Rubs or new Murmurs, No Parasternal Heave +ve B.Sounds, Abd Soft, No tenderness, No  organomegaly appriciated, No rebound - guarding or rigidity. No Cyanosis,    Data Review:    CBC Recent Labs  Lab 02/14/20 0430 02/15/20 0409 02/16/20 0039 02/17/20 0104  WBC 6.4 7.5 7.9 9.2  HGB 10.9* 11.4* 11.8* 11.6*  HCT 34.2* 35.4* 34.1* 36.3  PLT 210 249 269 306  MCV 89.8 88.7 85.7 87.9  MCH 28.6 28.6 29.6 28.1  MCHC 31.9 32.2 34.6 32.0  RDW 13.9 13.5 13.2 13.2  LYMPHSABS 0.8 0.8 0.7 0.6*  MONOABS 0.3 0.3 0.3 0.6  EOSABS 0.0 0.0 0.0 0.0  BASOSABS 0.0 0.0 0.0 0.0    Chemistries  Recent Labs  Lab 02/14/20 0430 02/15/20 0409 02/16/20 0039 02/17/20 0104 02/18/20 0549  NA 146* 138 139 139  --   K 3.4* 4.0 4.3 4.0  --   CL 114* 109 110 107  --   CO2 22 20* 19* 21*  --   GLUCOSE 126* 120* 124* 118*  --   BUN 37* 35* 41* 39*  --   CREATININE 1.03* 0.82 0.87 0.77 0.81  CALCIUM 8.3* 8.6* 9.2 9.4  --   MG 2.3 2.1 2.0 2.1  --   AST 192* 270* 139* 79*  --   ALT 126* 242* 199* 164*  --   ALKPHOS 44 47 53 55  --   BILITOT 0.5 0.5 0.6 0.5  --    ------------------------------------------------------------------------------------------------------------------ No results for input(s): CHOL, HDL, LDLCALC, TRIG, CHOLHDL, LDLDIRECT in the last 72 hours.  No results found for: HGBA1C ------------------------------------------------------------------------------------------------------------------ No results for input(s): TSH, T4TOTAL, T3FREE, THYROIDAB in the last 72 hours.  Invalid input(s): FREET3 ------------------------------------------------------------------------------------------------------------------ No results for input(s): VITAMINB12, FOLATE, FERRITIN, TIBC, IRON, RETICCTPCT in the last 72 hours.  Coagulation profile Recent Labs  Lab 02/16/20 0039 02/17/20 0104 02/18/20 0549 02/19/20 0105 02/20/20 0237  INR 1.1 1.0 1.0 1.1 1.1     No results for input(s): DDIMER in the last 72 hours.  Cardiac Enzymes No results for input(s): CKMB, TROPONINI, MYOGLOBIN in the last 168 hours.  Invalid input(s): CK ------------------------------------------------------------------------------------------------------------------    Component Value Date/Time   BNP 153.7 (H) 02/17/2020 0104    Micro Results Recent Results (from the past 240 hour(s))  Culture, blood (routine x 2)     Status: None   Collection Time: 02/11/20  4:02 PM   Specimen: BLOOD  Result Value Ref Range Status   Specimen Description BLOOD RIGHT ANTECUBITAL  Final   Special Requests   Final    BOTTLES DRAWN AEROBIC AND ANAEROBIC Blood Culture results may not be optimal due to an inadequate volume of blood received in culture bottles   Culture   Final    NO GROWTH 5 DAYS Performed at [facility], [place] [address], [place], [place] [address]    Report Status 02/16/2020 FINAL  Final  Urine culture     Status: Abnormal   Collection Time: 02/11/20  4:52 PM   Specimen: Urine, Random  Result Value Ref Range Status   Specimen Description URINE, RANDOM  Final   Special Requests   Final    NONE Performed at [facility], [place] [address], [place],  [address]    Culture >=100,000 COLONIES/mL ESCHERICHIA COLI (A)  Final   Report Status 02/14/2020 FINAL  Final   Organism ID, Bacteria ESCHERICHIA COLI (A)  Final      Susceptibility   Escherichia coli - MIC*    AMPICILLIN >=32 RESISTANT Resistant     CEFAZOLIN 16 SENSITIVE  Sensitive     CEFEPIME <=0.12 SENSITIVE Sensitive     CEFTRIAXONE <=0.25 SENSITIVE Sensitive     CIPROFLOXACIN <=0.25 SENSITIVE Sensitive     GENTAMICIN <=1 SENSITIVE Sensitive     IMIPENEM 0.5 SENSITIVE Sensitive     NITROFURANTOIN <=16 SENSITIVE Sensitive     TRIMETH/SULFA <=20 SENSITIVE Sensitive     AMPICILLIN/SULBACTAM >=32 RESISTANT Resistant     PIP/TAZO <=4 SENSITIVE Sensitive     * >=100,000 COLONIES/mL  ESCHERICHIA COLI  Culture, blood (routine x 2)     Status: None   Collection Time: 02/11/20  5:00 PM   Specimen: BLOOD RIGHT ARM  Result Value Ref Range Status   Specimen Description BLOOD RIGHT ARM  Final   Special Requests   Final    BOTTLES DRAWN AEROBIC ONLY Blood Culture results may not be optimal due to an inadequate volume of blood received in culture bottles   Culture   Final    NO GROWTH 5 DAYS Performed at [facility], [place] [address], [place], [place] [address]    Report Status 02/16/2020 FINAL  Final  Resp Panel by RT-PCR (Flu A&B, Covid) Nasopharyngeal Swab     Status: Abnormal   Collection Time: 02/11/20  7:21 PM   Specimen: Nasopharyngeal Swab; Nasopharyngeal(NP) swabs in vial transport medium  Result Value Ref Range Status   SARS Coronavirus 2 by RT PCR POSITIVE (A) NEGATIVE Final    Comment: RESULT CALLED TO, READ BACK BY AND VERIFIED WITH: Teofilo Pod RN 02/11/20 AT 2158 SK (NOTE) SARS-CoV-2 target nucleic acids are DETECTED.  The SARS-CoV-2 RNA is generally detectable in upper respiratory specimens during the acute phase of infection. Positive results are indicative of the presence of the identified virus, but do not rule out bacterial infection or co-infection with other pathogens not detected by the test. Clinical correlation with patient history and other diagnostic information is necessary to determine patient infection status. The expected result is Negative.  Fact Sheet for Patients: EntrepreneurPulse.com.au  Fact Sheet for Healthcare Providers: IncredibleEmployment.be  This test is not yet approved or cleared by the [place] FDA and  has been authorized for detection and/or diagnosis of SARS-CoV-2 by FDA under an Emergency Use Authorization (EUA).  This EUA will remain in effect (meaning this test can be  used) for the duration of  the COVID-19 declaration under Section 564(b)(1) of the Act, 21 U.S.C.  section 360bbb-3(b)(1), unless the authorization is terminated or revoked sooner.     Influenza A by PCR NEGATIVE NEGATIVE Final   Influenza B by PCR NEGATIVE NEGATIVE Final    Comment: (NOTE) The Xpert Xpress SARS-CoV-2/FLU/RSV plus assay is intended as an aid in the diagnosis of influenza from Nasopharyngeal swab specimens and should not be used as a sole basis for treatment. Nasal washings and aspirates are unacceptable for Xpert Xpress SARS-CoV-2/FLU/RSV testing.  Fact Sheet for Patients: EntrepreneurPulse.com.au  Fact Sheet for Healthcare Providers: IncredibleEmployment.be  This test is not yet approved or cleared by the [place] FDA and has been authorized for detection and/or diagnosis of SARS-CoV-2 by FDA under an Emergency Use Authorization (EUA). This EUA will remain in effect (meaning this test can be used) for the duration of the COVID-19 declaration under Section 564(b)(1) of the Act, 21 U.S.C. section 360bbb-3(b)(1), unless the authorization is terminated or revoked.  Performed at [facility], [place] [address], [place],  [address]     Radiology Reports DG Pelvis 1-2 Views  Result Date: 02/12/2020  CLINICAL DATA:  Weakness and foul-smelling urine EXAM: PELVIS - 1 VIEW COMPARISON:  09/03/2019 FINDINGS: There is no evidence of pelvic fracture or diastasis. No pelvic bone lesions are seen. IMPRESSION: No acute abnormality noted. Electronically Signed   By: Inez Catalina M.D.   On: 02/12/2020 02:13   CT HEAD WO CONTRAST  Result Date: 02/12/2020 CLINICAL DATA:  Weakness for 1 week EXAM: CT HEAD WITHOUT CONTRAST CT CERVICAL SPINE WITHOUT CONTRAST TECHNIQUE: Multidetector CT imaging of the head and cervical spine was performed following the standard protocol without intravenous contrast. Multiplanar CT image reconstructions of the cervical spine were also generated. COMPARISON:  None. FINDINGS: CT HEAD FINDINGS Brain:  There is no mass, hemorrhage or extra-axial collection. The size and configuration of the ventricles and extra-axial CSF spaces are normal. There is hypoattenuation of the periventricular white matter, most commonly indicating chronic ischemic microangiopathy. Vascular: No abnormal hyperdensity of the major intracranial arteries or dural venous sinuses. No intracranial atherosclerosis. Skull: The visualized skull base, calvarium and extracranial soft tissues are normal. Sinuses/Orbits: No fluid levels or advanced mucosal thickening of the visualized paranasal sinuses. No mastoid or middle ear effusion. The orbits are normal. CT CERVICAL SPINE FINDINGS Alignment: No static subluxation. Facets are aligned. Occipital condyles are normally positioned. Skull base and vertebrae: No acute fracture. Soft tissues and spinal canal: No prevertebral fluid or swelling. No visible canal hematoma. Disc levels: No advanced spinal canal or neural foraminal stenosis. Upper chest: No pneumothorax, pulmonary nodule or pleural effusion. Other: Calcific aortic atherosclerosis. IMPRESSION: 1. Chronic ischemic microangiopathy without acute intracranial abnormality. 2. No acute fracture or static subluxation of the cervical spine. Aortic Atherosclerosis (ICD10-I70.0). Electronically Signed   By: Ulyses Jarred M.D.   On: 02/12/2020 01:11   CT CERVICAL SPINE WO CONTRAST  Result Date: 02/12/2020 CLINICAL DATA:  Weakness for 1 week EXAM: CT HEAD WITHOUT CONTRAST CT CERVICAL SPINE WITHOUT CONTRAST TECHNIQUE: Multidetector CT imaging of the head and cervical spine was performed following the standard protocol without intravenous contrast. Multiplanar CT image reconstructions of the cervical spine were also generated. COMPARISON:  None. FINDINGS: CT HEAD FINDINGS Brain: There is no mass, hemorrhage or extra-axial collection. The size and configuration of the ventricles and extra-axial CSF spaces are normal. There is hypoattenuation of the  periventricular white matter, most commonly indicating chronic ischemic microangiopathy. Vascular: No abnormal hyperdensity of the major intracranial arteries or dural venous sinuses. No intracranial atherosclerosis. Skull: The visualized skull base, calvarium and extracranial soft tissues are normal. Sinuses/Orbits: No fluid levels or advanced mucosal thickening of the visualized paranasal sinuses. No mastoid or middle ear effusion. The orbits are normal. CT CERVICAL SPINE FINDINGS Alignment: No static subluxation. Facets are aligned. Occipital condyles are normally positioned. Skull base and vertebrae: No acute fracture. Soft tissues and spinal canal: No prevertebral fluid or swelling. No visible canal hematoma. Disc levels: No advanced spinal canal or neural foraminal stenosis. Upper chest: No pneumothorax, pulmonary nodule or pleural effusion. Other: Calcific aortic atherosclerosis. IMPRESSION: 1. Chronic ischemic microangiopathy without acute intracranial abnormality. 2. No acute fracture or static subluxation of the cervical spine. Aortic Atherosclerosis (ICD10-I70.0). Electronically Signed   By: Ulyses Jarred M.D.   On: 02/12/2020 01:11   US RENAL  Result Date: 02/13/2020 CLINICAL DATA:  Acute kidney injury. EXAM: RENAL / URINARY TRACT ULTRASOUND COMPLETE COMPARISON:  None. FINDINGS: Right Kidney: Renal measurements: 9.9 x 4.7 x 4.9 cm = volume: 118.5 mL. Mild renal cortical thinning. Mild to moderate hydronephrosis. No mass, no mass  or stone. Normal parenchymal echogenicity. Trace amount of nonspecific perinephric edema or fluid. Left Kidney: Renal measurements: 10.2 x 4.7 x 4.3 cm = volume: 111.4 mL. Renal cortical thinning. Slight dilation of the intrarenal collecting system. Normal parenchymal echogenicity. No mass or stone. Trace amount of perinephric fluid or edema. Bladder: Distended. Small amount of dependent debris. No mass or stone. Ureteral jets not visualized. Other: None. IMPRESSION: 1. Mild  to moderate right and slight left renal collecting system dilation, suspected to be on the basis of bladder distension. No renal masses or stones. 2. Renal cortical thinning, left greater than right. Electronically Signed   By: Lajean Manes M.D.   On: 02/13/2020 11:24   DG Chest Port 1 View  Result Date: 02/13/2020 CLINICAL DATA:  Shortness of breath, COVID-19 positivity EXAM: PORTABLE CHEST 1 VIEW COMPARISON:  02/11/2020 FINDINGS: Cardiac shadow is stable. Aortic calcifications are noted. The lungs are well aerated bilaterally. No focal infiltrate or sizable effusion is seen. No acute bony abnormality is seen. IMPRESSION: No acute abnormality noted. Electronically Signed   By: Inez Catalina M.D.   On: 02/13/2020 08:52   DG Chest Portable 1 View  Result Date: 02/11/2020 CLINICAL DATA:  Weakness. EXAM: PORTABLE CHEST 1 VIEW COMPARISON:  CT chest 12/0 scratch the CT chest 02/19/2019. FINDINGS: The lungs clear. Heart size normal. Aortic atherosclerosis. No pneumothorax or pleural fluid. IMPRESSION: No acute disease. Aortic Atherosclerosis (ICD10-I70.0). Electronically Signed   By: Inge Rise M.D.   On: 02/11/2020 17:46   VAS [place] LOWER EXTREMITY VENOUS (DVT)  Result Date: 02/15/2020  Lower Venous DVT Study Indications: Swelling.  Risk Factors: COVID 19 positive. Comparison Study: No prior studies. Performing Technologist: Oliver Hum RVT  Examination Guidelines: A complete evaluation includes B-mode imaging, spectral Doppler, color Doppler, and power Doppler as needed of all accessible portions of each vessel. Bilateral testing is considered an integral part of a complete examination. Limited examinations for reoccurring indications may be performed as noted. The reflux portion of the exam is performed with the patient in reverse Trendelenburg.  +---------+---------------+---------+-----------+----------+--------------+ RIGHT    CompressibilityPhasicitySpontaneityPropertiesThrombus Aging  +---------+---------------+---------+-----------+----------+--------------+ CFV      Full           Yes      Yes                                 +---------+---------------+---------+-----------+----------+--------------+ SFJ      Full                                                        +---------+---------------+---------+-----------+----------+--------------+ FV Prox  Full                                                        +---------+---------------+---------+-----------+----------+--------------+ FV Mid   Full                                                        +---------+---------------+---------+-----------+----------+--------------+  FV DistalFull                                                        +---------+---------------+---------+-----------+----------+--------------+ PFV      Full                                                        +---------+---------------+---------+-----------+----------+--------------+ POP      Full           Yes      Yes                                 +---------+---------------+---------+-----------+----------+--------------+ PTV      Full                                                        +---------+---------------+---------+-----------+----------+--------------+ PERO     Full                                                        +---------+---------------+---------+-----------+----------+--------------+   +---------+---------------+---------+-----------+----------+--------------+ LEFT     CompressibilityPhasicitySpontaneityPropertiesThrombus Aging +---------+---------------+---------+-----------+----------+--------------+ CFV      Full           Yes      Yes                                 +---------+---------------+---------+-----------+----------+--------------+ SFJ      Full                                                         +---------+---------------+---------+-----------+----------+--------------+ FV Prox  Full                                                        +---------+---------------+---------+-----------+----------+--------------+ FV Mid   Full                                                        +---------+---------------+---------+-----------+----------+--------------+ FV DistalFull                                                        +---------+---------------+---------+-----------+----------+--------------+  PFV      Full                                                        +---------+---------------+---------+-----------+----------+--------------+ POP      Full           Yes      Yes                                 +---------+---------------+---------+-----------+----------+--------------+ PTV      Full                                                        +---------+---------------+---------+-----------+----------+--------------+ PERO     Full                                                        +---------+---------------+---------+-----------+----------+--------------+     Summary: RIGHT: - There is no evidence of deep vein thrombosis in the lower extremity.  - No cystic structure found in the popliteal fossa.  LEFT: - There is no evidence of deep vein thrombosis in the lower extremity.  - No cystic structure found in the popliteal fossa.  *See table(s) above for measurements and observations. Electronically signed by Ruta Hinds MD on 02/15/2020 at 5:00:56 PM.    Final    US Abdomen Limited RUQ (LIVER/GB)  Result Date: 02/14/2020 CLINICAL DATA:  Transaminitis EXAM: ULTRASOUND ABDOMEN LIMITED RIGHT UPPER QUADRANT COMPARISON:  None. FINDINGS: Gallbladder: No gallstones or wall thickening visualized. No sonographic Murphy sign noted by sonographer. Common bile duct: Diameter: 6 mm Liver: No focal lesion identified. Within normal limits in parenchymal  echogenicity. Portal vein is patent on color Doppler imaging with normal direction of blood flow towards the liver. Other: Incidentally noted is a right-sided pleural effusion. IMPRESSION: 1. No specific abnormality to explain the patient's transaminitis. 2. Incidentally noted right-sided pleural effusion. Electronically Signed   By: Constance Holster M.D.   On: 02/14/2020 19:05

## 2020-02-20 NOTE — Progress Notes (Signed)
Physical Therapy Treatment Patient Details Name: Sylvia Petersen MRN: [id number] DOB: [birth year] Today's Date: 02/20/2020    History of Present Illness Patient is a 86 y.o. female with PMHx of HLD, HTN-who has had progressive generalized weakness for approximately 1 week-apparently 4 prior to this hospitalization-patient sustained a mechanical fall-and was on the floor for almost 12 hours-patient was helped back to her bed by neighbors-since then she has been mostly bedbound/weak and frail-family subsequently called EMS and she was brought to the ED.    PT Comments    Patient slightly rotated onto her rt side on arrival. Assisted to supine and removed bil PRAFOs. Performed AAROM>AROM, contract-relax, and prolonged PROM stretches to bil LEs, lower trunk, and neck (see below for details). Patient fully participates and understands the need to work through the discomfort to regain ROM which is essential for walking/balancing. RN and NT education provided re: wear schedule for PRAFOs, positioning on left side more often than rt to assist with neck ROM, and need for lift or +2 assist for OOB.    Follow Up Recommendations  SNF     Equipment Recommendations  Rolling walker with 5" wheels;3in1 (PT);Wheelchair (measurements PT);Wheelchair cushion (measurements PT);Hospital bed    Recommendations for Other Services       Precautions / Restrictions Precautions Precautions: Fall Precaution Comments: watch for orthostatic hypotension Required Braces or Orthoses: Other Brace Other Brace: has one rigid PRAFO and one prevalon boot to be alternated rt/lt every 4 hours; already has DF contractures    Mobility  Bed Mobility Overal bed mobility: Needs Assistance Bed Mobility: Rolling Rolling: Max assist         General bed mobility comments: after ROM, positioned on left side for neck stretch  Transfers Overall transfer level: Needs assistance               General transfer comment:  needs +2 or maxi-move  Ambulation/Gait                 Stairs             Wheelchair Mobility    Modified Rankin (Stroke Patients Only)       Balance                                            Cognition Arousal/Alertness: Awake/alert Behavior During Therapy: Flat affect Overall Cognitive Status: No family/caregiver present to determine baseline cognitive functioning Area of Impairment: Problem solving                             Problem Solving: Slow processing;Decreased initiation General Comments: slow processing vs bradykinesia. Is able to recall previous events with increased time      Exercises Other Exercises Other Exercises: AAROM followed by prolonged stretch (min 30 seconds) in supine: ankle DF, knee flexion, hip flexion, hip abdct, hip IR/ER, lower trunk rotation, cervical rotation and lateral flexion (contract relax) and positioned on left side at end of session for prolonged neck stretch into neutral (she is becoming fixed in rt lateral flexion)    General Comments General comments (skin integrity, edema, etc.): Patient reports she does not like "boots" on her legs because then she cannot move her legs. Acknowledged and explained rationale that she is getting very tight heel cords and will be unable to stand if  we don't get her stretched out. Encouraged her to focus on AROM of UEs and head/neck.      Pertinent Vitals/Pain Pain Assessment: Faces Faces Pain Scale: Hurts whole lot Pain Location: neck; bil groin Pain Descriptors / Indicators: Grimacing;Discomfort;Guarding Pain Intervention(s): Limited activity within patient's tolerance;Monitored during session;Repositioned;Heat applied    Home Living                      Prior Function            PT Goals (current goals can now be found in the care plan section) Acute Rehab PT Goals Patient Stated Goal: be able to feed myself and get out of bed Time For  Goal Achievement: 02/28/20 Potential to Achieve Goals: Good Progress towards PT goals: Progressing toward goals (improved neck and DF ROM after stretching)    Frequency    Min 2X/week      PT Plan Current plan remains appropriate    Co-evaluation              AM-PAC PT "6 Clicks" Mobility   Outcome Measure  Help needed turning from your back to your side while in a flat bed without using bedrails?: A Lot Help needed moving from lying on your back to sitting on the side of a flat bed without using bedrails?: Total Help needed moving to and from a bed to a chair (including a wheelchair)?: Total Help needed standing up from a chair using your arms (e.g., wheelchair or bedside chair)?: Total Help needed to walk in hospital room?: Total Help needed climbing 3-5 steps with a railing? : Total 6 Click Score: 7    End of Session   Activity Tolerance: Patient limited by pain Patient left: in bed;with call bell/phone within reach (left sidelying) Nurse Communication: Mobility status;Need for lift equipment;Other (comment) (alternating boots rt/lt q 4 hours) PT Visit Diagnosis: Unsteadiness on feet (R26.81);Other abnormalities of gait and mobility (R26.89);History of falling (Z91.81)     Time: 0272-5366 PT Time Calculation (min) (ACUTE ONLY): 60 min  Charges:  $Therapeutic Exercise: 38-52 mins $Therapeutic Activity: 8-22 mins                      Arby Barrette, PT Pager [phone]    Rexanne Mano 02/20/2020, 11:02 AM

## 2020-02-21 LAB — CBC
HCT: 34.8 % — ABNORMAL LOW (ref 36.0–46.0)
Hemoglobin: 11.3 g/dL — ABNORMAL LOW (ref 12.0–15.0)
MCH: 28.7 pg (ref 26.0–34.0)
MCHC: 32.5 g/dL (ref 30.0–36.0)
MCV: 88.3 fL (ref 80.0–100.0)
Platelets: 429 10*3/uL — ABNORMAL HIGH (ref 150–400)
RBC: 3.94 MIL/uL (ref 3.87–5.11)
RDW: 12.8 % (ref 11.5–15.5)
WBC: 8.4 10*3/uL (ref 4.0–10.5)
nRBC: 0 % (ref 0.0–0.2)

## 2020-02-21 LAB — MAGNESIUM: Magnesium: 2 mg/dL (ref 1.7–2.4)

## 2020-02-21 LAB — BASIC METABOLIC PANEL
Anion gap: 10 (ref 5–15)
BUN: 27 mg/dL — ABNORMAL HIGH (ref 8–23)
CO2: 21 mmol/L — ABNORMAL LOW (ref 22–32)
Calcium: 8.9 mg/dL (ref 8.9–10.3)
Chloride: 105 mmol/L (ref 98–111)
Creatinine, Ser: 0.72 mg/dL (ref 0.44–1.00)
GFR, Estimated: >60 mL/min (ref >60)
Glucose, Bld: 84 mg/dL (ref 70–99)
Potassium: 4 mmol/L (ref 3.5–5.1)
Sodium: 136 mmol/L (ref 135–145)

## 2020-02-21 LAB — PROTIME-INR
INR: 1.1 (ref 0.8–1.2)
Prothrombin Time: 13.8 seconds (ref 11.4–15.2)

## 2020-02-21 NOTE — Progress Notes (Signed)
PROGRESS NOTE                                                                                                                                                                                                             Patient Demographics:    Sylvia Petersen, is a 86 y.o. female, DOB - 01/01/1930, CZY:[id number]  Outpatient Primary MD for the patient is [name], MD   Admit date - 02/11/2020   LOS - 10  Chief Complaint  Patient presents with  . Code Sepsis       Brief Narrative: Patient is a 86 y.o. female with PMHx of HLD, HTN-who has had progressive generalized weakness for approximately 1 week-apparently 4 prior to this hospitalization-patient sustained a mechanical fall-and was on the floor for almost 12 hours-patient was helped back to her bed by neighbors-since then she has been mostly bedbound/weak and frail-family subsequently called EMS and she was brought to the ED.  Upon further evaluation-she was found to have UXNAT-55 infection, complicated UTI-and admitted to the hospitalist service.  See below for further details.  COVID-19 vaccinated status: Unvaccinated (claims that she thinks vaccinated people get more Covid than the unvaccinated)  Significant Events: 12/2>> Admit to [facility] for DDUKG-25 infection, complicated UTI, AKI  Significant studies: 12/2>>Chest x-ray: No active disease 12/3>> x-ray pelvis: No acute abnormality noted  COVID-19 medications: Monoclonal antibody infusion: 12/3 x 1  Antibiotics: Rocephin: 12/2>>  Microbiology data: 12/2 >>blood culture: No growth 12/2>> urine culture: Pending  Procedures: None  Consults: None  DVT prophylaxis: enoxaparin (LOVENOX) injection 40 mg Start: 02/14/20 2200  She is medically ready for discharge we await bed.    Subjective:   Patient sitting in chair having breakfast, denies any headache chest or abdominal pain, mild lower extremity  discomfort due to pro-form boots.  No shortness of breath.   Assessment  & Plan :   Sepsis from complicated UTI and KYHCW-23 infection: Continue supportive care-follow cultures.  Afebrile-sepsis pathophysiology has completely resolved.  Pansensitive E. coli UTI: Continue Rocephin x 5 days.  COVID-19 infection: she had very mild disease with elevated CRP only, was treated with a course of steroids which she has finished remained stable and no hypoxia.   Recent Labs  Lab 02/15/20 0409 02/15/20 1101 02/16/20 0039 02/17/20 0104 02/18/20 7628 02/19/20 0105 02/20/20 0237 02/21/20 0422  WBC 7.5  --  7.9 9.2  --   --   --  8.4  HGB 11.4*  --  11.8* 11.6*  --   --   --  11.3*  HCT 35.4*  --  34.1* 36.3  --   --   --  34.8*  PLT 249  --  269 306  --   --   --  429*  CRP 5.4*  --  3.0* 1.6*  --   --   --   --   BNP 159.6*  --  124.0* 153.7*  --   --   --   --   DDIMER 2.26*  --  1.88* 1.88*  --   --   --   --   PROCALCITON 0.55  --  0.27 0.11  --   --   --   --   AST 270*  --  139* 79*  --   --   --   --   ALT 242*  --  199* 164*  --   --   --   --   ALKPHOS 47  --  53 55  --   --   --   --   BILITOT 0.5  --  0.6 0.5  --   --   --   --   ALBUMIN 2.4*  --  2.5* 2.5*  --   --   --   --   INR  --    < > 1.1 1.0 1.0 1.1 1.1 1.1   < > = values in this interval not displayed.     Transaminitis - likely due to COVID-19 and mild rhabdomyolysis, holding statin, stable right upper quadrant ultrasound, negative hepatitis panel, stable INR, trend is improving.  HLD: Statin on hold due to transaminitis.  AKI: With reported fall at home and prolonged downtime, mild rhabdo, solved after hydration with IV fluids.     Urinary retention.  Foley and Flomax, Foley placed 02/13/2020. Discontinue Foley on 02/16/2020 and monitor.  Elevated D-dimer.  Due to intense inflammation. Detailed lower extremity ultrasound, continue moderate dose Lovenox, D-dimer is now downtrending.Marland Kitchen  HTN: On Norvasc and  beta-blocker.  ACE inhibitor held due to AKI.  As needed hydralazine added.  Severe debility/deconditioning: Secondary to COVID-19 infection-await PT/OT eval. he cannot go home safely however her health insurance does not cover custodial care at SNF, I discussed this with health insurance MD Dr. Amalia Hailey on 02/17/2020 at 7:15 in the morning. He has thoroughly reviewed patient's chart and benefits and does not think she qualifies for SNF as she is not rehab full. Family will appeal.  Hypernatremia. Resolved after hydration with D5W.     Condition - Extremely Guarded  Family Communication  :  Son [phone]) updated over the phone 02/13/20 - DNR, updated again on 02/14/20, called 7:30 in the morning on 02/17/2020. No response, 02/20/20 at 10:10 AM message left.  Code Status : DNR  Diet :  Diet Order            DIET SOFT Room service appropriate? No; Fluid consistency: Thin  Diet effective now                  Disposition Plan  :   Status is: Inpatient  Remains inpatient appropriate because:Inpatient level of care appropriate due to severity of illness   Dispo: The patient is from: Home  Anticipated d/c is to: TBD              Anticipated d/c date is: > 3 days              Patient currently is not medically stable to d/c.  Barriers to discharge: Complicated UTI requiring IV antibiotics-awaiting culture data, profound generalized debility/weakness-due to COVID-19 infection-needs PT eval to determine appropriate disposition.  Antimicorbials  :    Anti-infectives (From admission, onward)   Start     Dose/Rate Route Frequency Ordered Stop   02/13/20 1800  cefTRIAXone (ROCEPHIN) 1 g in sodium chloride 0.9 % 100 mL IVPB        1 g 200 mL/hr over 30 Minutes Intravenous Every 24 hours 02/13/20 0917 02/18/20 1809   02/13/20 1000  doxycycline (VIBRA-TABS) tablet 100 mg        100 mg Oral Every 12 hours 02/13/20 0917 02/17/20 2030   02/12/20 1900  cefTRIAXone (ROCEPHIN)  1 g in sodium chloride 0.9 % 100 mL IVPB  Status:  Discontinued        1 g 200 mL/hr over 30 Minutes Intravenous Every 24 hours 02/11/20 2117 02/13/20 0917   02/11/20 1830  cefTRIAXone (ROCEPHIN) 1 g in sodium chloride 0.9 % 100 mL IVPB        1 g 200 mL/hr over 30 Minutes Intravenous  Once 02/11/20 1818 02/11/20 2029      Inpatient Medications  Scheduled Meds: . amLODipine  10 mg Oral Daily  . vitamin C  500 mg Oral Daily  . Chlorhexidine Gluconate Cloth  6 each Topical Daily  . enoxaparin (LOVENOX) injection  40 mg Subcutaneous Q24H  . metoprolol tartrate  50 mg Oral BID  . pantoprazole sodium  40 mg Oral Daily  . tamsulosin  0.4 mg Oral Daily  . zinc sulfate  220 mg Oral Daily   Continuous Infusions: . sodium chloride     PRN Meds:.sodium chloride, acetaminophen, albuterol, hydrALAZINE, ondansetron (ZOFRAN) IV, traMADol   Time Spent in minutes  25  See all Orders from today for further details   Lala Lund M.D on 02/21/2020 at 9:56 AM  To page go to [email or web address] - use universal password  [facility] -  Office  [phone]    Objective:   Vitals:   02/20/20 1311 02/20/20 2121 02/21/20 0455 02/21/20 0756  BP: 126/61 (!) 130/58 139/73 130/62  Pulse: 63 65 73 64  Resp: 13 16 14 16   Temp: 98.2 F (36.8 C) 98 F (36.7 C) 98.1 F (36.7 C) 98.2 F (36.8 C)  TempSrc: Axillary Oral Oral Axillary  SpO2: 96% 94% 96% 96%  Weight:      Height:        Wt Readings from Last 3 Encounters:  02/11/20 53.1 kg  09/03/19 64 kg  04/25/16 64.6 kg     Intake/Output Summary (Last 24 hours) at 02/21/2020 0956 Last data filed at 02/20/2020 2131 Gross per 24 hour  Intake --  Output 600 ml  Net -600 ml     Physical Exam  Awake, mildly confused, No new F.N deficits, Foley in place, bilateral pro-form boots [place].AT,PERRAL Supple Neck,No JVD, No cervical lymphadenopathy appriciated.  Symmetrical Chest wall movement, Good air movement bilaterally, CTAB RRR,No  Gallops, Rubs or new Murmurs, No Parasternal Heave +ve B.Sounds, Abd Soft, No tenderness, No organomegaly appriciated, No rebound - guarding or rigidity. No Cyanosis    Data Review:    CBC Recent Labs  Lab 02/15/20 0409 02/16/20  9379 02/17/20 0104 02/21/20 0422  WBC 7.5 7.9 9.2 8.4  HGB 11.4* 11.8* 11.6* 11.3*  HCT 35.4* 34.1* 36.3 34.8*  PLT 249 269 306 429*  MCV 88.7 85.7 87.9 88.3  MCH 28.6 29.6 28.1 28.7  MCHC 32.2 34.6 32.0 32.5  RDW 13.5 13.2 13.2 12.8  LYMPHSABS 0.8 0.7 0.6*  --   MONOABS 0.3 0.3 0.6  --   EOSABS 0.0 0.0 0.0  --   BASOSABS 0.0 0.0 0.0  --     Chemistries  Recent Labs  Lab 02/15/20 0409 02/16/20 0039 02/17/20 0104 02/18/20 0549 02/21/20 0422  NA 138 139 139  --  136  K 4.0 4.3 4.0  --  4.0  CL 109 110 107  --  105  CO2 20* 19* 21*  --  21*  GLUCOSE 120* 124* 118*  --  84  BUN 35* 41* 39*  --  27*  CREATININE 0.82 0.87 0.77 0.81 0.72  CALCIUM 8.6* 9.2 9.4  --  8.9  MG 2.1 2.0 2.1  --  2.0  AST 270* 139* 79*  --   --   ALT 242* 199* 164*  --   --   ALKPHOS 47 53 55  --   --   BILITOT 0.5 0.6 0.5  --   --    ------------------------------------------------------------------------------------------------------------------ No results for input(s): CHOL, HDL, LDLCALC, TRIG, CHOLHDL, LDLDIRECT in the last 72 hours.  No results found for: HGBA1C ------------------------------------------------------------------------------------------------------------------ No results for input(s): TSH, T4TOTAL, T3FREE, THYROIDAB in the last 72 hours.  Invalid input(s): FREET3 ------------------------------------------------------------------------------------------------------------------ No results for input(s): VITAMINB12, FOLATE, FERRITIN, TIBC, IRON, RETICCTPCT in the last 72 hours.  Coagulation profile Recent Labs  Lab 02/17/20 0104 02/18/20 0549 02/19/20 0105 02/20/20 0237 02/21/20 0422  INR 1.0 1.0 1.1 1.1 1.1    No results for input(s):  DDIMER in the last 72 hours.  Cardiac Enzymes No results for input(s): CKMB, TROPONINI, MYOGLOBIN in the last 168 hours.  Invalid input(s): CK ------------------------------------------------------------------------------------------------------------------    Component Value Date/Time   BNP 153.7 (H) 02/17/2020 0104    Micro Results Recent Results (from the past 240 hour(s))  Culture, blood (routine x 2)     Status: None   Collection Time: 02/11/20  4:02 PM   Specimen: BLOOD  Result Value Ref Range Status   Specimen Description BLOOD RIGHT ANTECUBITAL  Final   Special Requests   Final    BOTTLES DRAWN AEROBIC AND ANAEROBIC Blood Culture results may not be optimal due to an inadequate volume of blood received in culture bottles   Culture   Final    NO GROWTH 5 DAYS Performed at [facility], [place] [address], [place], [place] [address]    Report Status 02/16/2020 FINAL  Final  Urine culture     Status: Abnormal   Collection Time: 02/11/20  4:52 PM   Specimen: Urine, Random  Result Value Ref Range Status   Specimen Description URINE, RANDOM  Final   Special Requests   Final    NONE Performed at [facility], [place] [address], [place],  [address]    Culture >=100,000 COLONIES/mL ESCHERICHIA COLI (A)  Final   Report Status 02/14/2020 FINAL  Final   Organism ID, Bacteria ESCHERICHIA COLI (A)  Final      Susceptibility   Escherichia coli - MIC*    AMPICILLIN >=32 RESISTANT Resistant     CEFAZOLIN 16 SENSITIVE Sensitive     CEFEPIME <=0.12 SENSITIVE Sensitive  CEFTRIAXONE <=0.25 SENSITIVE Sensitive     CIPROFLOXACIN <=0.25 SENSITIVE Sensitive     GENTAMICIN <=1 SENSITIVE Sensitive     IMIPENEM 0.5 SENSITIVE Sensitive     NITROFURANTOIN <=16 SENSITIVE Sensitive     TRIMETH/SULFA <=20 SENSITIVE Sensitive     AMPICILLIN/SULBACTAM >=32 RESISTANT Resistant     PIP/TAZO <=4 SENSITIVE Sensitive     * >=100,000 COLONIES/mL ESCHERICHIA COLI  Culture, blood  (routine x 2)     Status: None   Collection Time: 02/11/20  5:00 PM   Specimen: BLOOD RIGHT ARM  Result Value Ref Range Status   Specimen Description BLOOD RIGHT ARM  Final   Special Requests   Final    BOTTLES DRAWN AEROBIC ONLY Blood Culture results may not be optimal due to an inadequate volume of blood received in culture bottles   Culture   Final    NO GROWTH 5 DAYS Performed at [facility], [place] [address], [place], [place] [address]    Report Status 02/16/2020 FINAL  Final  Resp Panel by RT-PCR (Flu A&B, Covid) Nasopharyngeal Swab     Status: Abnormal   Collection Time: 02/11/20  7:21 PM   Specimen: Nasopharyngeal Swab; Nasopharyngeal(NP) swabs in vial transport medium  Result Value Ref Range Status   SARS Coronavirus 2 by RT PCR POSITIVE (A) NEGATIVE Final    Comment: RESULT CALLED TO, READ BACK BY AND VERIFIED WITH: Teofilo Pod RN 02/11/20 AT 2158 SK (NOTE) SARS-CoV-2 target nucleic acids are DETECTED.  The SARS-CoV-2 RNA is generally detectable in upper respiratory specimens during the acute phase of infection. Positive results are indicative of the presence of the identified virus, but do not rule out bacterial infection or co-infection with other pathogens not detected by the test. Clinical correlation with patient history and other diagnostic information is necessary to determine patient infection status. The expected result is Negative.  Fact Sheet for Patients: EntrepreneurPulse.com.au  Fact Sheet for Healthcare Providers: IncredibleEmployment.be  This test is not yet approved or cleared by the [place] FDA and  has been authorized for detection and/or diagnosis of SARS-CoV-2 by FDA under an Emergency Use Authorization (EUA).  This EUA will remain in effect (meaning this test can be  used) for the duration of  the COVID-19 declaration under Section 564(b)(1) of the Act, 21 U.S.C. section 360bbb-3(b)(1), unless the  authorization is terminated or revoked sooner.     Influenza A by PCR NEGATIVE NEGATIVE Final   Influenza B by PCR NEGATIVE NEGATIVE Final    Comment: (NOTE) The Xpert Xpress SARS-CoV-2/FLU/RSV plus assay is intended as an aid in the diagnosis of influenza from Nasopharyngeal swab specimens and should not be used as a sole basis for treatment. Nasal washings and aspirates are unacceptable for Xpert Xpress SARS-CoV-2/FLU/RSV testing.  Fact Sheet for Patients: EntrepreneurPulse.com.au  Fact Sheet for Healthcare Providers: IncredibleEmployment.be  This test is not yet approved or cleared by the [place] FDA and has been authorized for detection and/or diagnosis of SARS-CoV-2 by FDA under an Emergency Use Authorization (EUA). This EUA will remain in effect (meaning this test can be used) for the duration of the COVID-19 declaration under Section 564(b)(1) of the Act, 21 U.S.C. section 360bbb-3(b)(1), unless the authorization is terminated or revoked.  Performed at [facility], [place] [address] [place], [place] [address]     Radiology Reports DG Pelvis 1-2 Views  Result Date: 02/12/2020 CLINICAL DATA:  Weakness and foul-smelling urine EXAM: PELVIS - 1 VIEW COMPARISON:  09/03/2019 FINDINGS: There is no evidence of pelvic fracture or diastasis. No pelvic bone lesions are seen. IMPRESSION: No acute abnormality noted. Electronically Signed   By: Inez Catalina M.D.   On: 02/12/2020 02:13   CT HEAD WO CONTRAST  Result Date: 02/12/2020 CLINICAL DATA:  Weakness for 1 week EXAM: CT HEAD WITHOUT CONTRAST CT CERVICAL SPINE WITHOUT CONTRAST TECHNIQUE: Multidetector CT imaging of the head and cervical spine was performed following the standard protocol without intravenous contrast. Multiplanar CT image reconstructions of the cervical spine were also generated. COMPARISON:  None. FINDINGS: CT HEAD FINDINGS Brain: There is no mass, hemorrhage or  extra-axial collection. The size and configuration of the ventricles and extra-axial CSF spaces are normal. There is hypoattenuation of the periventricular white matter, most commonly indicating chronic ischemic microangiopathy. Vascular: No abnormal hyperdensity of the major intracranial arteries or dural venous sinuses. No intracranial atherosclerosis. Skull: The visualized skull base, calvarium and extracranial soft tissues are normal. Sinuses/Orbits: No fluid levels or advanced mucosal thickening of the visualized paranasal sinuses. No mastoid or middle ear effusion. The orbits are normal. CT CERVICAL SPINE FINDINGS Alignment: No static subluxation. Facets are aligned. Occipital condyles are normally positioned. Skull base and vertebrae: No acute fracture. Soft tissues and spinal canal: No prevertebral fluid or swelling. No visible canal hematoma. Disc levels: No advanced spinal canal or neural foraminal stenosis. Upper chest: No pneumothorax, pulmonary nodule or pleural effusion. Other: Calcific aortic atherosclerosis. IMPRESSION: 1. Chronic ischemic microangiopathy without acute intracranial abnormality. 2. No acute fracture or static subluxation of the cervical spine. Aortic Atherosclerosis (ICD10-I70.0). Electronically Signed   By: Ulyses Jarred M.D.   On: 02/12/2020 01:11   CT CERVICAL SPINE WO CONTRAST  Result Date: 02/12/2020 CLINICAL DATA:  Weakness for 1 week EXAM: CT HEAD WITHOUT CONTRAST CT CERVICAL SPINE WITHOUT CONTRAST TECHNIQUE: Multidetector CT imaging of the head and cervical spine was performed following the standard protocol without intravenous contrast. Multiplanar CT image reconstructions of the cervical spine were also generated. COMPARISON:  None. FINDINGS: CT HEAD FINDINGS Brain: There is no mass, hemorrhage or extra-axial collection. The size and configuration of the ventricles and extra-axial CSF spaces are normal. There is hypoattenuation of the periventricular white matter, most  commonly indicating chronic ischemic microangiopathy. Vascular: No abnormal hyperdensity of the major intracranial arteries or dural venous sinuses. No intracranial atherosclerosis. Skull: The visualized skull base, calvarium and extracranial soft tissues are normal. Sinuses/Orbits: No fluid levels or advanced mucosal thickening of the visualized paranasal sinuses. No mastoid or middle ear effusion. The orbits are normal. CT CERVICAL SPINE FINDINGS Alignment: No static subluxation. Facets are aligned. Occipital condyles are normally positioned. Skull base and vertebrae: No acute fracture. Soft tissues and spinal canal: No prevertebral fluid or swelling. No visible canal hematoma. Disc levels: No advanced spinal canal or neural foraminal stenosis. Upper chest: No pneumothorax, pulmonary nodule or pleural effusion. Other: Calcific aortic atherosclerosis. IMPRESSION: 1. Chronic ischemic microangiopathy without acute intracranial abnormality. 2. No acute fracture or static subluxation of the cervical spine. Aortic Atherosclerosis (ICD10-I70.0). Electronically Signed   By: Ulyses Jarred M.D.   On: 02/12/2020 01:11   US RENAL  Result Date: 02/13/2020 CLINICAL DATA:  Acute kidney injury. EXAM: RENAL / URINARY TRACT ULTRASOUND COMPLETE COMPARISON:  None. FINDINGS: Right Kidney: Renal measurements: 9.9 x 4.7 x 4.9 cm = volume: 118.5 mL. Mild renal cortical thinning. Mild to moderate hydronephrosis. No mass, no mass or stone. Normal parenchymal echogenicity. Trace amount of nonspecific perinephric edema or fluid. Left  Kidney: Renal measurements: 10.2 x 4.7 x 4.3 cm = volume: 111.4 mL. Renal cortical thinning. Slight dilation of the intrarenal collecting system. Normal parenchymal echogenicity. No mass or stone. Trace amount of perinephric fluid or edema. Bladder: Distended. Small amount of dependent debris. No mass or stone. Ureteral jets not visualized. Other: None. IMPRESSION: 1. Mild to moderate right and slight left  renal collecting system dilation, suspected to be on the basis of bladder distension. No renal masses or stones. 2. Renal cortical thinning, left greater than right. Electronically Signed   By: Lajean Manes M.D.   On: 02/13/2020 11:24   DG Chest Port 1 View  Result Date: 02/13/2020 CLINICAL DATA:  Shortness of breath, COVID-19 positivity EXAM: PORTABLE CHEST 1 VIEW COMPARISON:  02/11/2020 FINDINGS: Cardiac shadow is stable. Aortic calcifications are noted. The lungs are well aerated bilaterally. No focal infiltrate or sizable effusion is seen. No acute bony abnormality is seen. IMPRESSION: No acute abnormality noted. Electronically Signed   By: Inez Catalina M.D.   On: 02/13/2020 08:52   DG Chest Portable 1 View  Result Date: 02/11/2020 CLINICAL DATA:  Weakness. EXAM: PORTABLE CHEST 1 VIEW COMPARISON:  CT chest 12/0 scratch the CT chest 02/19/2019. FINDINGS: The lungs clear. Heart size normal. Aortic atherosclerosis. No pneumothorax or pleural fluid. IMPRESSION: No acute disease. Aortic Atherosclerosis (ICD10-I70.0). Electronically Signed   By: Inge Rise M.D.   On: 02/11/2020 17:46   VAS [place] LOWER EXTREMITY VENOUS (DVT)  Result Date: 02/15/2020  Lower Venous DVT Study Indications: Swelling.  Risk Factors: COVID 19 positive. Comparison Study: No prior studies. Performing Technologist: Oliver Hum RVT  Examination Guidelines: A complete evaluation includes B-mode imaging, spectral Doppler, color Doppler, and power Doppler as needed of all accessible portions of each vessel. Bilateral testing is considered an integral part of a complete examination. Limited examinations for reoccurring indications may be performed as noted. The reflux portion of the exam is performed with the patient in reverse Trendelenburg.  +---------+---------------+---------+-----------+----------+--------------+ RIGHT    CompressibilityPhasicitySpontaneityPropertiesThrombus Aging  +---------+---------------+---------+-----------+----------+--------------+ CFV      Full           Yes      Yes                                 +---------+---------------+---------+-----------+----------+--------------+ SFJ      Full                                                        +---------+---------------+---------+-----------+----------+--------------+ FV Prox  Full                                                        +---------+---------------+---------+-----------+----------+--------------+ FV Mid   Full                                                        +---------+---------------+---------+-----------+----------+--------------+ FV DistalFull                                                        +---------+---------------+---------+-----------+----------+--------------+  PFV      Full                                                        +---------+---------------+---------+-----------+----------+--------------+ POP      Full           Yes      Yes                                 +---------+---------------+---------+-----------+----------+--------------+ PTV      Full                                                        +---------+---------------+---------+-----------+----------+--------------+ PERO     Full                                                        +---------+---------------+---------+-----------+----------+--------------+   +---------+---------------+---------+-----------+----------+--------------+ LEFT     CompressibilityPhasicitySpontaneityPropertiesThrombus Aging +---------+---------------+---------+-----------+----------+--------------+ CFV      Full           Yes      Yes                                 +---------+---------------+---------+-----------+----------+--------------+ SFJ      Full                                                         +---------+---------------+---------+-----------+----------+--------------+ FV Prox  Full                                                        +---------+---------------+---------+-----------+----------+--------------+ FV Mid   Full                                                        +---------+---------------+---------+-----------+----------+--------------+ FV DistalFull                                                        +---------+---------------+---------+-----------+----------+--------------+ PFV      Full                                                        +---------+---------------+---------+-----------+----------+--------------+  POP      Full           Yes      Yes                                 +---------+---------------+---------+-----------+----------+--------------+ PTV      Full                                                        +---------+---------------+---------+-----------+----------+--------------+ PERO     Full                                                        +---------+---------------+---------+-----------+----------+--------------+     Summary: RIGHT: - There is no evidence of deep vein thrombosis in the lower extremity.  - No cystic structure found in the popliteal fossa.  LEFT: - There is no evidence of deep vein thrombosis in the lower extremity.  - No cystic structure found in the popliteal fossa.  *See table(s) above for measurements and observations. Electronically signed by Ruta Hinds MD on 02/15/2020 at 5:00:56 PM.    Final    US Abdomen Limited RUQ (LIVER/GB)  Result Date: 02/14/2020 CLINICAL DATA:  Transaminitis EXAM: ULTRASOUND ABDOMEN LIMITED RIGHT UPPER QUADRANT COMPARISON:  None. FINDINGS: Gallbladder: No gallstones or wall thickening visualized. No sonographic Murphy sign noted by sonographer. Common bile duct: Diameter: 6 mm Liver: No focal lesion identified. Within normal limits in parenchymal  echogenicity. Portal vein is patent on color Doppler imaging with normal direction of blood flow towards the liver. Other: Incidentally noted is a right-sided pleural effusion. IMPRESSION: 1. No specific abnormality to explain the patient's transaminitis. 2. Incidentally noted right-sided pleural effusion. Electronically Signed   By: Constance Holster M.D.   On: 02/14/2020 19:05

## 2020-02-22 LAB — PROTIME-INR
INR: 1.1 (ref 0.8–1.2)
Prothrombin Time: 13.8 seconds (ref 11.4–15.2)

## 2020-02-22 LAB — GLUCOSE, CAPILLARY: Glucose-Capillary: 95 mg/dL (ref 70–99)

## 2020-02-22 NOTE — Progress Notes (Signed)
Physical Therapy Treatment Patient Details Name: Sylvia Petersen MRN: [id number] DOB: 03-07-1930 Today's Date: 02/22/2020    History of Present Illness Patient is a 86 y.o. female with PMHx of HLD, HTN-who has had progressive generalized weakness for approximately 1 week-apparently 4 prior to this hospitalization-patient sustained a mechanical fall-and was on the floor for almost 12 hours-patient was helped back to her bed by neighbors-since then she has been mostly bedbound/weak and frail-family subsequently called EMS and she was brought to the ED.    PT Comments    Patient up in chair via 2 person assist per nursing. Awake, alert however with very delayed responses. Required incr assist this date with sit to stand with stedy lift. Bil knees now with contractures and left ankle PF contracture persists. Required +2 total assist for sit to stand x 4 reps. Communicated with team re: needs for discharge home with son now that insurance denial final.      Follow Up Recommendations  SNF;Other (comment) (insurance denied skilled care; pt appropriate for long-term care however family plans to take her home)     Financial risk analyst (measurements PT);Wheelchair cushion (measurements PT);Hospital bed;Other (comment) (hoyer lift, medical transport)    Recommendations for Other Services       Precautions / Restrictions Precautions Precautions: Fall Precaution Comments: watch for orthostatic hypotension Required Braces or Orthoses: Other Brace Other Brace: has one rigid PRAFO and one prevalon boot to be alternated rt/lt every 4 hours; already has DF contractures Restrictions Weight Bearing Restrictions: No    Mobility  Bed Mobility                  Transfers Overall transfer level: Needs assistance   Transfers: Sit to/from Stand Sit to Stand: Total assist;+2 physical assistance;+2 safety/equipment         General transfer comment: up in recliner via 2  person assist with nursing; stood x 4 from either recliner or seat of stedy; does bear weight and pull with UEs on grab bar, however <25% of effort completed by pt  Ambulation/Gait                 Stairs             Wheelchair Mobility    Modified Rankin (Stroke Patients Only)       Balance Overall balance assessment: Needs assistance Sitting-balance support: Bilateral upper extremity supported Sitting balance-Leahy Scale: Poor Sitting balance - Comments: edge of chair close guarding without UE support Postural control: Posterior lean   Standing balance-Leahy Scale: Zero Standing balance comment: reliant on staff support and stedy                            Cognition Arousal/Alertness: Awake/alert Behavior During Therapy: Flat affect Overall Cognitive Status: No family/caregiver present to determine baseline cognitive functioning Area of Impairment: Problem solving                             Problem Solving: Slow processing;Decreased initiation General Comments: slow processing, delayed initiation, and  bradykinesia. Is able to recall previous events with increased time      Exercises Other Exercises Other Exercises: LAQ x 5-10 reps prior to standing; passive DF to Rt ankle to neutral    General Comments General comments (skin integrity, edema, etc.): Noted insurance continues to deny SNF and she has no coverage for custodial/long-term care  Pertinent Vitals/Pain Pain Assessment: Faces Faces Pain Scale: Hurts even more Pain Location: neck Pain Descriptors / Indicators: Discomfort;Guarding;Tightness Pain Intervention(s): Monitored during session;Repositioned    Home Living                      Prior Function            PT Goals (current goals can now be found in the care plan section) Acute Rehab PT Goals Patient Stated Goal: be able to feed myself and get out of bed Time For Goal Achievement:  02/28/20 Potential to Achieve Goals: Good Progress towards PT goals: Not progressing toward goals - comment    Frequency    Min 2X/week      PT Plan Discharge plan needs to be updated;Equipment recommendations need to be updated    Co-evaluation PT/OT/SLP Co-Evaluation/Treatment: Yes Reason for Co-Treatment: Complexity of the patient's impairments (multi-system involvement);For patient/therapist safety;To address functional/ADL transfers PT goals addressed during session: Mobility/safety with mobility;Strengthening/ROM        AM-PAC PT "6 Clicks" Mobility   Outcome Measure  Help needed turning from your back to your side while in a flat bed without using bedrails?: A Lot Help needed moving from lying on your back to sitting on the side of a flat bed without using bedrails?: Total Help needed moving to and from a bed to a chair (including a wheelchair)?: Total Help needed standing up from a chair using your arms (e.g., wheelchair or bedside chair)?: Total Help needed to walk in hospital room?: Total Help needed climbing 3-5 steps with a railing? : Total 6 Click Score: 7    End of Session Equipment Utilized During Treatment: Other (comment) (bed pad) Activity Tolerance: Patient limited by fatigue Patient left: in chair;Other (comment) (with OT) Nurse Communication: Mobility status;Need for lift equipment PT Visit Diagnosis: Unsteadiness on feet (R26.81);Other abnormalities of gait and mobility (R26.89);History of falling (Z91.81)     Time: 8453-6468 PT Time Calculation (min) (ACUTE ONLY): 19 min  Charges:  $Therapeutic Activity: 8-22 mins                      Arby Barrette, PT Pager [phone]    Rexanne Mano 02/22/2020, 12:12 PM

## 2020-02-22 NOTE — Progress Notes (Signed)
PROGRESS NOTE                                                                                                                                                                                                             Patient Demographics:    Sylvia Petersen, is a 86 y.o. female, DOB - March 25, 1929, ZOX:[id number]  Outpatient Primary MD for the patient is [name], MD   Admit date - 02/11/2020   LOS - 7  Chief Complaint  Patient presents with  . Code Sepsis       Brief Narrative: Patient is a 86 y.o. female with PMHx of HLD, HTN-who has had progressive generalized weakness for approximately 1 week-apparently 4 prior to this hospitalization-patient sustained a mechanical fall-and was on the floor for almost 12 hours-patient was helped back to her bed by neighbors-since then she has been mostly bedbound/weak and frail-family subsequently called EMS and she was brought to the ED.  Upon further evaluation-she was found to have [id number] infection, complicated UTI-and admitted to the hospitalist service.  See below for further details.  COVID-19 vaccinated status: Unvaccinated (claims that she thinks vaccinated people get more Covid than the unvaccinated)  Significant Events: 12/2>> Admit to [facility] for WGNFA-21 infection, complicated UTI, AKI  Significant studies: 12/2>>Chest x-ray: No active disease 12/3>> x-ray pelvis: No acute abnormality noted  COVID-19 medications: Monoclonal antibody infusion: 12/3 x 1  Antibiotics: Rocephin: 12/2>>  Microbiology data: 12/2 >>blood culture: No growth 12/2>> urine culture: Pending  Procedures: None  Consults: None  DVT prophylaxis: enoxaparin (LOVENOX) injection 40 mg Start: 02/14/20 2200  She is medically ready for discharge we await bed.    Subjective:   Patient in bed no distress but appears quite frail, no headache chest or abdominal pain.   Assessment  & Plan :    Sepsis from complicated UTI and HYQMV-78 infection: Continue supportive care-follow cultures.  Afebrile-sepsis pathophysiology has completely resolved.  Pansensitive E. coli UTI: Continue Rocephin x 5 days.  COVID-19 infection: she had very mild disease with elevated CRP only, was treated with a course of steroids which she has finished remained stable and no hypoxia.   Recent Labs  Lab 02/16/20 0039 02/17/20 0104 02/18/20 0549 02/19/20 0105 02/20/20 0237 02/21/20 0422 02/22/20 0040  WBC 7.9 9.2  --   --   --  8.4  --  HGB 11.8* 11.6*  --   --   --  11.3*  --   HCT 34.1* 36.3  --   --   --  34.8*  --   PLT 269 306  --   --   --  429*  --   CRP 3.0* 1.6*  --   --   --   --   --   BNP 124.0* 153.7*  --   --   --   --   --   DDIMER 1.88* 1.88*  --   --   --   --   --   PROCALCITON 0.27 0.11  --   --   --   --   --   AST 139* 79*  --   --   --   --   --   ALT 199* 164*  --   --   --   --   --   ALKPHOS 53 55  --   --   --   --   --   BILITOT 0.6 0.5  --   --   --   --   --   ALBUMIN 2.5* 2.5*  --   --   --   --   --   INR 1.1 1.0 1.0 1.1 1.1 1.1 1.1     Transaminitis - likely due to COVID-19 and mild rhabdomyolysis, holding statin, stable right upper quadrant ultrasound, negative hepatitis panel, stable INR, trend is improving.  HLD: Statin on hold due to transaminitis.  AKI: With reported fall at home and prolonged downtime, mild rhabdo, solved after hydration with IV fluids.     Urinary retention.  Foley and Flomax, Foley placed 02/13/2020. Discontinue Foley on 02/16/2020 and monitor.  Elevated D-dimer.  Due to intense inflammation. Detailed lower extremity ultrasound, continue moderate dose Lovenox, D-dimer is now downtrending.Marland Kitchen  HTN: On Norvasc and beta-blocker.  ACE inhibitor held due to AKI.  As needed hydralazine added.  Severe debility/deconditioning: Due to advanced age, COVID-106 infection she is severely deconditioned and cannot stand or walk by herself, has  minimum assistance at home and would ideally require SNF, however her insurance does not cover this benefit. I discussed this with health insurance MD Dr. Amalia Hailey on 02/17/2020 at 7:15 in the morning. He has thoroughly reviewed patient's chart and benefits and does not think she qualifies for SNF as she is not rehab appropriate and she will be a custodial placement which her insurance does not approve, apparently he has reviewed her chart several months ago and family at that time was told that her insurance will not cover custodial care, apparently patient assets appropriate to cover for self-pay according to social work.  Hypernatremia. Resolved after hydration with D5W.   Condition - Extremely Guarded  Family Communication  :  Son [phone]) updated over the phone 02/13/20 - DNR, updated again on 02/14/20, called 7:30 in the morning on 02/17/2020. No response, 02/20/20 at 10:10 AM message left.  Code Status : DNR  Diet :  Diet Order            DIET SOFT Room service appropriate? No; Fluid consistency: Thin  Diet effective now                  Disposition Plan  :   Status is: Inpatient  Remains inpatient appropriate because:Inpatient level of care appropriate due to severity of illness   Dispo: The patient is from: Home  Anticipated d/c is to: TBD              Anticipated d/c date is: > 3 days              Patient currently is not medically stable to d/c.  Barriers to discharge: Complicated UTI requiring IV antibiotics-awaiting culture data, profound generalized debility/weakness-due to COVID-19 infection-needs PT eval to determine appropriate disposition.  Antimicorbials  :    Anti-infectives (From admission, onward)   Start     Dose/Rate Route Frequency Ordered Stop   02/13/20 1800  cefTRIAXone (ROCEPHIN) 1 g in sodium chloride 0.9 % 100 mL IVPB        1 g 200 mL/hr over 30 Minutes Intravenous Every 24 hours 02/13/20 0917 02/18/20 1809   02/13/20 1000   doxycycline (VIBRA-TABS) tablet 100 mg        100 mg Oral Every 12 hours 02/13/20 0917 02/17/20 2030   02/12/20 1900  cefTRIAXone (ROCEPHIN) 1 g in sodium chloride 0.9 % 100 mL IVPB  Status:  Discontinued        1 g 200 mL/hr over 30 Minutes Intravenous Every 24 hours 02/11/20 2117 02/13/20 0917   02/11/20 1830  cefTRIAXone (ROCEPHIN) 1 g in sodium chloride 0.9 % 100 mL IVPB        1 g 200 mL/hr over 30 Minutes Intravenous  Once 02/11/20 1818 02/11/20 2029      Inpatient Medications  Scheduled Meds: . amLODipine  10 mg Oral Daily  . vitamin C  500 mg Oral Daily  . Chlorhexidine Gluconate Cloth  6 each Topical Daily  . enoxaparin (LOVENOX) injection  40 mg Subcutaneous Q24H  . metoprolol tartrate  50 mg Oral BID  . pantoprazole sodium  40 mg Oral Daily  . tamsulosin  0.4 mg Oral Daily  . zinc sulfate  220 mg Oral Daily   Continuous Infusions: . sodium chloride     PRN Meds:.sodium chloride, acetaminophen, albuterol, hydrALAZINE, ondansetron (ZOFRAN) IV, traMADol   Time Spent in minutes  25  See all Orders from today for further details   Lala Lund M.D on 02/22/2020 at 9:34 AM  To page go to [email or web address] - use universal password  [facility] -  Office  [phone]    Objective:   Vitals:   02/21/20 0756 02/21/20 1409 02/21/20 2048 02/22/20 0537  BP: 130/62 (!) 96/55 (!) 122/59 (!) 132/47  Pulse: 64 66 76 65  Resp: 16 16 17 16   Temp: 98.2 F (36.8 C) (!) 97.3 F (36.3 C) 97.8 F (36.6 C) 97.9 F (36.6 C)  TempSrc: Axillary Axillary Axillary Axillary  SpO2: 96% 95% 93% 98%  Weight:      Height:        Wt Readings from Last 3 Encounters:  02/11/20 53.1 kg  09/03/19 64 kg  04/25/16 64.6 kg     Intake/Output Summary (Last 24 hours) at 02/22/2020 0934 Last data filed at 02/22/2020 0156 Gross per 24 hour  Intake 138 ml  Output 775 ml  Net -637 ml     Physical Exam  Awake, mildly confused, No new F.N deficits, Foley in place, bilateral  pro-form boots [place].AT,PERRAL Supple Neck,No JVD, No cervical lymphadenopathy appriciated.  Symmetrical Chest wall movement, Good air movement bilaterally, CTAB RRR,No Gallops, Rubs or new Murmurs, No Parasternal Heave +ve B.Sounds, Abd Soft, No tenderness, No organomegaly appriciated, No rebound - guarding or rigidity. No Cyanosis, Clubbing or edema, No new Rash or bruise  Data Review:    CBC Recent Labs  Lab 02/16/20 0039 02/17/20 0104 02/21/20 0422  WBC 7.9 9.2 8.4  HGB 11.8* 11.6* 11.3*  HCT 34.1* 36.3 34.8*  PLT 269 306 429*  MCV 85.7 87.9 88.3  MCH 29.6 28.1 28.7  MCHC 34.6 32.0 32.5  RDW 13.2 13.2 12.8  LYMPHSABS 0.7 0.6*  --   MONOABS 0.3 0.6  --   EOSABS 0.0 0.0  --   BASOSABS 0.0 0.0  --     Chemistries  Recent Labs  Lab 02/16/20 0039 02/17/20 0104 02/18/20 0549 02/21/20 0422  NA 139 139  --  136  K 4.3 4.0  --  4.0  CL 110 107  --  105  CO2 19* 21*  --  21*  GLUCOSE 124* 118*  --  84  BUN 41* 39*  --  27*  CREATININE 0.87 0.77 0.81 0.72  CALCIUM 9.2 9.4  --  8.9  MG 2.0 2.1  --  2.0  AST 139* 79*  --   --   ALT 199* 164*  --   --   ALKPHOS 53 55  --   --   BILITOT 0.6 0.5  --   --    ------------------------------------------------------------------------------------------------------------------ No results for input(s): CHOL, HDL, LDLCALC, TRIG, CHOLHDL, LDLDIRECT in the last 72 hours.  No results found for: HGBA1C ------------------------------------------------------------------------------------------------------------------ No results for input(s): TSH, T4TOTAL, T3FREE, THYROIDAB in the last 72 hours.  Invalid input(s): FREET3 ------------------------------------------------------------------------------------------------------------------ No results for input(s): VITAMINB12, FOLATE, FERRITIN, TIBC, IRON, RETICCTPCT in the last 72 hours.  Coagulation profile Recent Labs  Lab 02/18/20 0549 02/19/20 0105 02/20/20 0237 02/21/20 0422  02/22/20 0040  INR 1.0 1.1 1.1 1.1 1.1    No results for input(s): DDIMER in the last 72 hours.  Cardiac Enzymes No results for input(s): CKMB, TROPONINI, MYOGLOBIN in the last 168 hours.  Invalid input(s): CK ------------------------------------------------------------------------------------------------------------------    Component Value Date/Time   BNP 153.7 (H) 02/17/2020 0104    Micro Results No results found for this or any previous visit (from the past 240 hour(s)).  Radiology Reports DG Pelvis 1-2 Views  Result Date: 02/12/2020 CLINICAL DATA:  Weakness and foul-smelling urine EXAM: PELVIS - 1 VIEW COMPARISON:  09/03/2019 FINDINGS: There is no evidence of pelvic fracture or diastasis. No pelvic bone lesions are seen. IMPRESSION: No acute abnormality noted. Electronically Signed   By: Inez Catalina M.D.   On: 02/12/2020 02:13   CT HEAD WO CONTRAST  Result Date: 02/12/2020 CLINICAL DATA:  Weakness for 1 week EXAM: CT HEAD WITHOUT CONTRAST CT CERVICAL SPINE WITHOUT CONTRAST TECHNIQUE: Multidetector CT imaging of the head and cervical spine was performed following the standard protocol without intravenous contrast. Multiplanar CT image reconstructions of the cervical spine were also generated. COMPARISON:  None. FINDINGS: CT HEAD FINDINGS Brain: There is no mass, hemorrhage or extra-axial collection. The size and configuration of the ventricles and extra-axial CSF spaces are normal. There is hypoattenuation of the periventricular white matter, most commonly indicating chronic ischemic microangiopathy. Vascular: No abnormal hyperdensity of the major intracranial arteries or dural venous sinuses. No intracranial atherosclerosis. Skull: The visualized skull base, calvarium and extracranial soft tissues are normal. Sinuses/Orbits: No fluid levels or advanced mucosal thickening of the visualized paranasal sinuses. No mastoid or middle ear effusion. The orbits are normal. CT CERVICAL SPINE  FINDINGS Alignment: No static subluxation. Facets are aligned. Occipital condyles are normally positioned. Skull base and vertebrae: No acute fracture. Soft tissues and spinal canal:  No prevertebral fluid or swelling. No visible canal hematoma. Disc levels: No advanced spinal canal or neural foraminal stenosis. Upper chest: No pneumothorax, pulmonary nodule or pleural effusion. Other: Calcific aortic atherosclerosis. IMPRESSION: 1. Chronic ischemic microangiopathy without acute intracranial abnormality. 2. No acute fracture or static subluxation of the cervical spine. Aortic Atherosclerosis (ICD10-I70.0). Electronically Signed   By: Ulyses Jarred M.D.   On: 02/12/2020 01:11   CT CERVICAL SPINE WO CONTRAST  Result Date: 02/12/2020 CLINICAL DATA:  Weakness for 1 week EXAM: CT HEAD WITHOUT CONTRAST CT CERVICAL SPINE WITHOUT CONTRAST TECHNIQUE: Multidetector CT imaging of the head and cervical spine was performed following the standard protocol without intravenous contrast. Multiplanar CT image reconstructions of the cervical spine were also generated. COMPARISON:  None. FINDINGS: CT HEAD FINDINGS Brain: There is no mass, hemorrhage or extra-axial collection. The size and configuration of the ventricles and extra-axial CSF spaces are normal. There is hypoattenuation of the periventricular white matter, most commonly indicating chronic ischemic microangiopathy. Vascular: No abnormal hyperdensity of the major intracranial arteries or dural venous sinuses. No intracranial atherosclerosis. Skull: The visualized skull base, calvarium and extracranial soft tissues are normal. Sinuses/Orbits: No fluid levels or advanced mucosal thickening of the visualized paranasal sinuses. No mastoid or middle ear effusion. The orbits are normal. CT CERVICAL SPINE FINDINGS Alignment: No static subluxation. Facets are aligned. Occipital condyles are normally positioned. Skull base and vertebrae: No acute fracture. Soft tissues and spinal  canal: No prevertebral fluid or swelling. No visible canal hematoma. Disc levels: No advanced spinal canal or neural foraminal stenosis. Upper chest: No pneumothorax, pulmonary nodule or pleural effusion. Other: Calcific aortic atherosclerosis. IMPRESSION: 1. Chronic ischemic microangiopathy without acute intracranial abnormality. 2. No acute fracture or static subluxation of the cervical spine. Aortic Atherosclerosis (ICD10-I70.0). Electronically Signed   By: Ulyses Jarred M.D.   On: 02/12/2020 01:11   US RENAL  Result Date: 02/13/2020 CLINICAL DATA:  Acute kidney injury. EXAM: RENAL / URINARY TRACT ULTRASOUND COMPLETE COMPARISON:  None. FINDINGS: Right Kidney: Renal measurements: 9.9 x 4.7 x 4.9 cm = volume: 118.5 mL. Mild renal cortical thinning. Mild to moderate hydronephrosis. No mass, no mass or stone. Normal parenchymal echogenicity. Trace amount of nonspecific perinephric edema or fluid. Left Kidney: Renal measurements: 10.2 x 4.7 x 4.3 cm = volume: 111.4 mL. Renal cortical thinning. Slight dilation of the intrarenal collecting system. Normal parenchymal echogenicity. No mass or stone. Trace amount of perinephric fluid or edema. Bladder: Distended. Small amount of dependent debris. No mass or stone. Ureteral jets not visualized. Other: None. IMPRESSION: 1. Mild to moderate right and slight left renal collecting system dilation, suspected to be on the basis of bladder distension. No renal masses or stones. 2. Renal cortical thinning, left greater than right. Electronically Signed   By: Lajean Manes M.D.   On: 02/13/2020 11:24   DG Chest Port 1 View  Result Date: 02/13/2020 CLINICAL DATA:  Shortness of breath, COVID-19 positivity EXAM: PORTABLE CHEST 1 VIEW COMPARISON:  02/11/2020 FINDINGS: Cardiac shadow is stable. Aortic calcifications are noted. The lungs are well aerated bilaterally. No focal infiltrate or sizable effusion is seen. No acute bony abnormality is seen. IMPRESSION: No acute  abnormality noted. Electronically Signed   By: Inez Catalina M.D.   On: 02/13/2020 08:52   DG Chest Portable 1 View  Result Date: 02/11/2020 CLINICAL DATA:  Weakness. EXAM: PORTABLE CHEST 1 VIEW COMPARISON:  CT chest 12/0 scratch the CT chest 02/19/2019. FINDINGS: The lungs clear. Heart size normal.  Aortic atherosclerosis. No pneumothorax or pleural fluid. IMPRESSION: No acute disease. Aortic Atherosclerosis (ICD10-I70.0). Electronically Signed   By: Inge Rise M.D.   On: 02/11/2020 17:46   VAS [place] LOWER EXTREMITY VENOUS (DVT)  Result Date: 02/15/2020  Lower Venous DVT Study Indications: Swelling.  Risk Factors: COVID 19 positive. Comparison Study: No prior studies. Performing Technologist: Oliver Hum RVT  Examination Guidelines: A complete evaluation includes B-mode imaging, spectral Doppler, color Doppler, and power Doppler as needed of all accessible portions of each vessel. Bilateral testing is considered an integral part of a complete examination. Limited examinations for reoccurring indications may be performed as noted. The reflux portion of the exam is performed with the patient in reverse Trendelenburg.  +---------+---------------+---------+-----------+----------+--------------+ RIGHT    CompressibilityPhasicitySpontaneityPropertiesThrombus Aging +---------+---------------+---------+-----------+----------+--------------+ CFV      Full           Yes      Yes                                 +---------+---------------+---------+-----------+----------+--------------+ SFJ      Full                                                        +---------+---------------+---------+-----------+----------+--------------+ FV Prox  Full                                                        +---------+---------------+---------+-----------+----------+--------------+ FV Mid   Full                                                         +---------+---------------+---------+-----------+----------+--------------+ FV DistalFull                                                        +---------+---------------+---------+-----------+----------+--------------+ PFV      Full                                                        +---------+---------------+---------+-----------+----------+--------------+ POP      Full           Yes      Yes                                 +---------+---------------+---------+-----------+----------+--------------+ PTV      Full                                                        +---------+---------------+---------+-----------+----------+--------------+  PERO     Full                                                        +---------+---------------+---------+-----------+----------+--------------+   +---------+---------------+---------+-----------+----------+--------------+ LEFT     CompressibilityPhasicitySpontaneityPropertiesThrombus Aging +---------+---------------+---------+-----------+----------+--------------+ CFV      Full           Yes      Yes                                 +---------+---------------+---------+-----------+----------+--------------+ SFJ      Full                                                        +---------+---------------+---------+-----------+----------+--------------+ FV Prox  Full                                                        +---------+---------------+---------+-----------+----------+--------------+ FV Mid   Full                                                        +---------+---------------+---------+-----------+----------+--------------+ FV DistalFull                                                        +---------+---------------+---------+-----------+----------+--------------+ PFV      Full                                                         +---------+---------------+---------+-----------+----------+--------------+ POP      Full           Yes      Yes                                 +---------+---------------+---------+-----------+----------+--------------+ PTV      Full                                                        +---------+---------------+---------+-----------+----------+--------------+ PERO     Full                                                        +---------+---------------+---------+-----------+----------+--------------+  Summary: RIGHT: - There is no evidence of deep vein thrombosis in the lower extremity.  - No cystic structure found in the popliteal fossa.  LEFT: - There is no evidence of deep vein thrombosis in the lower extremity.  - No cystic structure found in the popliteal fossa.  *See table(s) above for measurements and observations. Electronically signed by Ruta Hinds MD on 02/15/2020 at 5:00:56 PM.    Final    US Abdomen Limited RUQ (LIVER/GB)  Result Date: 02/14/2020 CLINICAL DATA:  Transaminitis EXAM: ULTRASOUND ABDOMEN LIMITED RIGHT UPPER QUADRANT COMPARISON:  None. FINDINGS: Gallbladder: No gallstones or wall thickening visualized. No sonographic Murphy sign noted by sonographer. Common bile duct: Diameter: 6 mm Liver: No focal lesion identified. Within normal limits in parenchymal echogenicity. Portal vein is patent on color Doppler imaging with normal direction of blood flow towards the liver. Other: Incidentally noted is a right-sided pleural effusion. IMPRESSION: 1. No specific abnormality to explain the patient's transaminitis. 2. Incidentally noted right-sided pleural effusion. Electronically Signed   By: Constance Holster M.D.   On: 02/14/2020 19:05

## 2020-02-22 NOTE — TOC Progression Note (Addendum)
Transition of Care [facility]) - Progression Note    Patient Details  Name: Sylvia Petersen MRN: [id number] Date of Birth: 1929/04/04  [facility] CM/SW [place], LCSW Phone Number: 02/22/2020, 8:45 AM  Clinical Narrative:    8:40am-CSW still awaiting results of SNF appeal.   8:53am-CSW received notification that patient's second appeal has been upheld by the health plan and patient will not be approved for SNF. CSW will make family aware; patient is active with [facility] home health services. Per patient's son, patient has too many assets to qualify for Medicaid in order to qualify for long term care at this time.   CSW left voicemail for patient's son, Ronalee Belts, regarding private pay cost at [place] [phone] for 30 days up front without therapy) versus home health and DME.   11am-CSW received return call from [place]. He declined private pay at [facility] and would rather take patient home. CSW discussed DME needs. He stated patient has a walker and BSC at home but requests a wheelchair and is agreeable to hoyer lift. He requests to wait on the hospital bed to see how patient does. He reported he spoke with [facility] and they are able to assist with any other DME from home. Will require PTAR home, CSW confirmed address. Son, [name], will be able to unlock door.    Expected Discharge Plan: [place] Barriers to Discharge: SNF Pending bed offer,Insurance Authorization,Continued Medical Work up  Expected Discharge Plan and Services Expected Discharge Plan: [place] In-house Referral: Clinical Social Work   Post Acute Care Choice: [place] arrangements for the past 2 months: Single Family Home                                       Social Determinants of Health (SDOH) Interventions    Readmission Risk Interventions No flowsheet data found.

## 2020-02-22 NOTE — Progress Notes (Signed)
Occupational Therapy Note  Seen as co-treat wth PT. Pt requires +2 total A for mobility and Max to total A with ADL tasks. Pt now requires max A with feeding to increase PO intake. Recommend rehab at SNF however if denied, will need [facility] services in addition to equipment listed below.     02/22/20 1300  OT Visit Information  Last OT Received On 02/22/20  Assistance Needed +2 (can benefit from ROM +1)  PT/OT/SLP Co-Evaluation/Treatment Yes  Reason for Co-Treatment Complexity of the patient's impairments (multi-system involvement);Necessary to address cognition/behavior during functional activity;For patient/therapist safety;To address functional/ADL transfers  OT goals addressed during session ADL's and self-care;Strengthening/ROM  History of Present Illness Patient is a 86 y.o. female with PMHx of HLD, HTN-who has had progressive generalized weakness for approximately 1 week-apparently 4 prior to this hospitalization-patient sustained a mechanical fall-and was on the floor for almost 12 hours-patient was helped back to her bed by neighbors-since then she has been mostly bedbound/weak and frail-family subsequently called EMS and she was brought to the ED.  Precautions  Precautions Fall  Precaution Comments watch for orthostatic hypotension  Required Braces or Orthoses Other Brace  Other Brace has one rigid PRAFO and one prevalon boot to be alternated rt/lt every 4 hours; already has DF contractures  Pain Assessment  Pain Assessment Faces  Faces Pain Scale 6  Pain Location neck  Pain Descriptors / Indicators Discomfort;Guarding;Tightness  Pain Intervention(s) Limited activity within patient's tolerance;Repositioned  Cognition  Arousal/Alertness Awake/alert  Behavior During Therapy Flat affect  Overall Cognitive Status No family/caregiver present to determine baseline cognitive functioning  Area of Impairment Problem solving  Problem Solving Slow processing;Decreased initiation  General  Comments slow processing, delayed initiation, and  bradykinesia. Is able to recall previous events with increased time  Upper Extremity Assessment  Upper Extremity Assessment  (limited B shoulder flex)  Lower Extremity Assessment  Lower Extremity Assessment Defer to PT evaluation  ADL  Eating/Feeding Maximal assistance;Sitting  Eating/Feeding Details (indicate cue type and reason) Poor po intake; pt holds cup but unable to completely bring to mouth duet o cognitve deficits in addition to generalized weakness and level of arousal  Grooming Maximal assistance;Sitting;Wash/dry face;Oral care;Brushing hair  Upper Body Bathing Maximal assistance;Sitting  Lower Body Bathing Maximal assistance;Bed level  Upper Body Dressing  Maximal assistance;Sitting  Lower Body Dressing Total assistance  Functional mobility during ADLs Total assistance;+2 for physical assistance  Bed Mobility  General bed mobility comments OOB in chair  Balance  Overall balance assessment Needs assistance  Sitting-balance support Bilateral upper extremity supported  Sitting balance-Leahy Scale Poor  Sitting balance - Comments edge of chair close guarding without UE support  Postural control Posterior lean  Standing balance-Leahy Scale Zero  Standing balance comment reliant on staff support and stedy  Transfers  Overall transfer level Needs assistance  Transfer via Lift Equipment Stedy  Transfers Sit to/from Stand  Sit to Stand Total assist;+2 physical assistance;+2 safety/equipment  General transfer comment up in recliner via 2 person assist with nursing; stood x 4 from either recliner or seat of stedy; does bear weight and pull with UEs on grab bar, however <25% of effort completed by pt  General Comments  General comments (skin integrity, edema, etc.) rehab denied by ins  Exercises  Exercises Other exercises  Other Exercises  Other Exercises BUE shoulder flex to 90 x 5  OT - End of Session  Equipment Utilized  During Treatment Gait belt;Other (comment) Charlaine Dalton)  Activity Tolerance Patient  tolerated treatment well  Patient left in chair;with call bell/phone within reach;with chair alarm set  Nurse Communication Mobility status  OT Assessment/Plan  OT Plan Discharge plan remains appropriate  OT Visit Diagnosis Muscle weakness (generalized) (M62.81);Other abnormalities of gait and mobility (R26.89);History of falling (Z91.81)  OT Frequency (ACUTE ONLY) Min 2X/week  Follow Up Recommendations SNF;Supervision/Assistance - 24 hour  OT Equipment Wheelchair (measurements OT);Wheelchair cushion (measurements OT);Hospital bed with air overlay mattress; Hoyer  AM-PAC OT "6 Clicks" Daily Activity Outcome Measure (Version 2)  Help from another person eating meals? 2  Help from another person taking care of personal grooming? 2  Help from another person toileting, which includes using toliet, bedpan, or urinal? 1  Help from another person bathing (including washing, rinsing, drying)? 2  Help from another person to put on and taking off regular upper body clothing? 2  Help from another person to put on and taking off regular lower body clothing? 1  6 Click Score 10  OT Goal Progression  Progress towards OT goals Progressing toward goals  Acute Rehab OT Goals  Patient Stated Goal be able to feed myself and get out of bed  OT Goal Formulation With patient  Time For Goal Achievement 02/28/20  Potential to Achieve Goals Fair  ADL Goals  Pt Will Perform Grooming with supervision;bed level  Pt Will Perform Upper Body Bathing with supervision;bed level  Pt/caregiver will Perform Home Exercise Program Increased strength;Increased ROM;Both right and left upper extremity;With written HEP provided;With Supervision  Additional ADL Goal #1 Pt will perform bed mobility with modA as precursor to EOB/OOB ADL.  Additional ADL Goal #2 Pt will maintain static balance EOB with modA as precursor to EOB/OOB ADL.  OT Time  Calculation  OT Start Time (ACUTE ONLY) 1016  OT Stop Time (ACUTE ONLY) 1043  OT Time Calculation (min) 27 min  OT General Charges  $OT Visit 1 Visit  OT Treatments  $Self Care/Home Management  8-22 mins  Maurie Boettcher, OT/L   Acute OT Clinical Specialist [place] Pager [phone] Office [phone]

## 2020-02-22 NOTE — Care Management (Signed)
Case manager contacted [place], Liaison with [facility] to update her that patient has to return home with resumption of services. Social worker will speak with son. TOC team will continue to monitor. Ricki Miller, RN BSN Case Manager

## 2020-02-23 LAB — GLUCOSE, CAPILLARY
Glucose-Capillary: 88 mg/dL (ref 70–99)
Glucose-Capillary: 98 mg/dL (ref 70–99)

## 2020-02-23 LAB — PROTIME-INR
INR: 1.1 (ref 0.8–1.2)
Prothrombin Time: 13.4 seconds (ref 11.4–15.2)

## 2020-02-23 MED ORDER — TAMSULOSIN HCL 0.4 MG PO CAPS: 0.4000 mg | ORAL_CAPSULE | Freq: Every day | ORAL | 0 refills | Status: DC

## 2020-02-23 MED ORDER — PANTOPRAZOLE SODIUM 40 MG PO TBEC: 40.0000 mg | DELAYED_RELEASE_TABLET | Freq: Every day | ORAL | 0 refills | Status: DC

## 2020-02-23 MED ORDER — ALBUTEROL SULFATE HFA 108 (90 BASE) MCG/ACT IN AERS: 2.0000 | INHALATION_SPRAY | Freq: Once | RESPIRATORY_TRACT | 0 refills | Status: DC | PRN

## 2020-02-23 NOTE — TOC Transition Note (Signed)
Transition of Care [facility]) - CM/SW Discharge Note   Patient Details  Name: Sylvia Petersen MRN: [id number] Date of Birth: 14-Mar-1929  [facility] of [facility]) CM/SW Contact:  Carles Collet, RN Phone Number: 02/23/2020, 11:47 AM   Clinical Narrative:    LVM w son Ronalee Belts, no callback. SPoke w son [name] on the phone. He verified that [employer] has spoke w him this morning to confirm delivery of Hoyer and WC to house.  He verified demographics and that him or his brother will be at the home for patient when Corey Harold arrives. Notified [place] liaison of DC today. PTAR called for transport. No other CM needs identified.      Rehanna, [place] Son [phone]    Daenerys, [place] [phone]             Final next level of care: [place] Barriers to Discharge: No Barriers Identified   Patient Goals and CMS Choice Patient states their goals for this hospitalization and ongoing recovery are:: home CMS Medicare.gov Compare Post Acute Care list provided to:: Patient Represenative (must comment) Choice offered to / list presented to : Patient  Discharge Placement                       Discharge Plan and Services In-house Referral: Clinical Social Work Discharge Planning Services: CM Consult Post Acute Care Choice: [place]          DME Arranged: Other see comment [facility]) DME Agency: [employer] Date DME Agency Contacted: 02/22/20 Time DME Agency Contacted: [phone] Representative spoke with at DME Agency: [place] Arranged: PT,OT,RN,Nurse's [place] Work [facility] Agency: [facility] Date [place] Agency Contacted: 02/23/20 Time [place]: [phone] Representative spoke with at [place]: [place] ([place]) Interventions     Readmission Risk Interventions No flowsheet data found.

## 2020-02-23 NOTE — Progress Notes (Signed)
Pt's foley bag changed d/t leaking from old bag. Pt d/c'ed with foley.

## 2020-02-23 NOTE — Progress Notes (Signed)
Sylvia Petersen to be D/C'd Home per MD order.  Discussed with the patient and patient's family and all questions fully answered.  VSS, Skin clean, dry and intact without evidence of skin break down, no evidence of skin tears noted. IV catheter discontinued intact. Site without signs and symptoms of complications. Dressing and pressure applied. Pt. Discharged with foley catheter.   An After Visit Summary was printed and given to PTAR.   D/c education completed with patient's son [name] including follow up instructions, medication list, d/c activities limitations if indicated, with other d/c instructions as indicated by MD - able to verbalize understanding, all questions fully answered.   Patient's family instructed to return patient to ED, call 911, or call MD for any changes in condition.   Patient escorted via PTAR.  Jeanella Craze 02/23/2020 1:18 PM

## 2020-02-23 NOTE — Discharge Instructions (Signed)
Follow with Primary MD [name], MD in 7 days   Get CBC, CMP, 2 view Chest X ray -  checked next visit within 1 week by Primary MD   Activity: As tolerated with Full fall precautions use walker/cane & assistance as needed  Disposition Home    Diet: Soft with feeding assistance and aspiration precautions.   Special Instructions: If you have smoked or chewed Tobacco  in the last 2 yrs please stop smoking, stop any regular Alcohol  and or any Recreational drug use.  On your next visit with your primary care physician please Get Medicines reviewed and adjusted.  Please request your Prim.MD to go over all Hospital Tests and Procedure/Radiological results at the follow up, please get all Hospital records sent to your Prim MD by signing hospital release before you go home.  If you experience worsening of your admission symptoms, develop shortness of breath, life threatening emergency, suicidal or homicidal thoughts you must seek medical attention immediately by calling 911 or calling your MD immediately  if symptoms less severe.  You Must read complete instructions/literature along with all the possible adverse reactions/side effects for all the Medicines you take and that have been prescribed to you. Take any new Medicines after you have completely understood and accpet all the possible adverse reactions/side effects.       Person Under Monitoring Name: Sylvia Petersen  Location: [place] [address]   Infection Prevention Recommendations for Individuals Confirmed to have, or Being Evaluated for, 2019 Novel Coronavirus (COVID-19) Infection Who Receive Care at Home  Individuals who are confirmed to have, or are being evaluated for, COVID-19 should follow the prevention steps below until a healthcare provider or local or state health department says they can return to normal activities.  Stay home except to get medical care You should restrict activities outside  your home, except for getting medical care. Do not go to work, school, or public areas, and do not use public transportation or taxis.  Call ahead before visiting your doctor Before your medical appointment, call the healthcare provider and tell them that you have, or are being evaluated for, COVID-19 infection. This will help the healthcare provider's office take steps to keep other people from getting infected. Ask your healthcare provider to call the local or state health department.  Monitor your symptoms Seek prompt medical attention if your illness is worsening (e.g., difficulty breathing). Before going to your medical appointment, call the healthcare provider and tell them that you have, or are being evaluated for, COVID-19 infection. Ask your healthcare provider to call the local or state health department.  Wear a facemask You should wear a facemask that covers your nose and mouth when you are in the same room with other people and when you visit a healthcare provider. People who live with or visit you should also wear a facemask while they are in the same room with you.  Separate yourself from other people in your home As much as possible, you should stay in a different room from other people in your home. Also, you should use a separate bathroom, if available.  Avoid sharing household items You should not share dishes, drinking glasses, cups, eating utensils, towels, bedding, or other items with other people in your home. After using these items, you should wash them thoroughly with soap and water.  Cover your coughs and sneezes Cover your mouth and nose with a tissue when you cough or sneeze, or you  can cough or sneeze into your sleeve. Throw used tissues in a lined trash can, and immediately wash your hands with soap and water for at least 20 seconds or use an alcohol-based hand rub.  Wash your [facility] your hands often and thoroughly with soap and water for at least 20  seconds. You can use an alcohol-based hand sanitizer if soap and water are not available and if your hands are not visibly dirty. Avoid touching your eyes, nose, and mouth with unwashed hands.   Prevention Steps for Caregivers and Household Members of Individuals Confirmed to have, or Being Evaluated for, COVID-19 Infection Being Cared for in the Home  If you live with, or provide care at home for, a person confirmed to have, or being evaluated for, COVID-19 infection please follow these guidelines to prevent infection:  Follow healthcare provider's instructions Make sure that you understand and can help the patient follow any healthcare provider instructions for all care.  Provide for the patient's basic needs You should help the patient with basic needs in the home and provide support for getting groceries, prescriptions, and other personal needs.  Monitor the patient's symptoms If they are getting sicker, call his or her medical provider and tell them that the patient has, or is being evaluated for, COVID-19 infection. This will help the healthcare provider's office take steps to keep other people from getting infected. Ask the healthcare provider to call the local or state health department.  Limit the number of people who have contact with the patient  If possible, have only one caregiver for the patient.  Other household members should stay in another home or place of residence. If this is not possible, they should stay  in another room, or be separated from the patient as much as possible. Use a separate bathroom, if available.  Restrict visitors who do not have an essential need to be in the home.  Keep older adults, very young children, and other sick people away from the patient Keep older adults, very young children, and those who have compromised immune systems or chronic health conditions away from the patient. This includes people with chronic heart, lung, or kidney  conditions, diabetes, and cancer.  Ensure good ventilation Make sure that shared spaces in the home have good air flow, such as from an air conditioner or an opened window, weather permitting.  Wash your hands often  Wash your hands often and thoroughly with soap and water for at least 20 seconds. You can use an alcohol based hand sanitizer if soap and water are not available and if your hands are not visibly dirty.  Avoid touching your eyes, nose, and mouth with unwashed hands.  Use disposable paper towels to dry your hands. If not available, use dedicated cloth towels and replace them when they become wet.  Wear a facemask and gloves  Wear a disposable facemask at all times in the room and gloves when you touch or have contact with the patient's blood, body fluids, and/or secretions or excretions, such as sweat, saliva, sputum, nasal mucus, vomit, urine, or feces.  Ensure the mask fits over your nose and mouth tightly, and do not touch it during use.  Throw out disposable facemasks and gloves after using them. Do not reuse.  Wash your hands immediately after removing your facemask and gloves.  If your personal clothing becomes contaminated, carefully remove clothing and launder. Wash your hands after handling contaminated clothing.  Place all used disposable facemasks, gloves,  and other waste in a lined container before disposing them with other household waste.  Remove gloves and wash your hands immediately after handling these items.  Do not share dishes, glasses, or other household items with the patient  Avoid sharing household items. You should not share dishes, drinking glasses, cups, eating utensils, towels, bedding, or other items with a patient who is confirmed to have, or being evaluated for, COVID-19 infection.  After the person uses these items, you should wash them thoroughly with soap and water.  Wash laundry thoroughly  Immediately remove and wash clothes or  bedding that have blood, body fluids, and/or secretions or excretions, such as sweat, saliva, sputum, nasal mucus, vomit, urine, or feces, on them.  Wear gloves when handling laundry from the patient.  Read and follow directions on labels of laundry or clothing items and detergent. In general, wash and dry with the warmest temperatures recommended on the label.  Clean all areas the individual has used often  Clean all touchable surfaces, such as counters, tabletops, doorknobs, bathroom fixtures, toilets, phones, keyboards, tablets, and bedside tables, every day. Also, clean any surfaces that may have blood, body fluids, and/or secretions or excretions on them.  Wear gloves when cleaning surfaces the patient has come in contact with.  Use a diluted bleach solution (e.g., dilute bleach with 1 part bleach and 10 parts water) or a household disinfectant with a label that says EPA-registered for coronaviruses. To make a bleach solution at home, add 1 tablespoon of bleach to 1 quart (4 cups) of water. For a larger supply, add  cup of bleach to 1 gallon (16 cups) of water.  Read labels of cleaning products and follow recommendations provided on product labels. Labels contain instructions for safe and effective use of the cleaning product including precautions you should take when applying the product, such as wearing gloves or eye protection and making sure you have good ventilation during use of the product.  Remove gloves and wash hands immediately after cleaning.  Monitor yourself for signs and symptoms of illness Caregivers and household members are considered close contacts, should monitor their health, and will be asked to limit movement outside of the home to the extent possible. Follow the monitoring steps for close contacts listed on the symptom monitoring form.   ? If you have additional questions, contact your local health department or call the epidemiologist on call at  [phone] (available 24/7). ? This guidance is subject to change. For the most up-to-date guidance from [facility], please refer to their website: [email or web address]

## 2020-02-23 NOTE — Discharge Summary (Signed)
BENNETTE Petersen KXF:[id number] DOB: [birth year] DOA: 02/11/2020  PCP: [name], MD  Admit date: 02/11/2020  Discharge date: 02/23/2020  Admitted From: Home   Disposition:  Home (insurance does not cover SNF placement anymore for her.  Appeal denied x2)   Recommendations for Outpatient Follow-up:   Follow up with PCP in 1-2 weeks  PCP Please obtain BMP/CBC, 2 view CXR in 1week,  (see Discharge instructions)   PCP Please follow up on the following pending results: Please check CBC, CMP and a two-view chest x-ray in 7 to 10 days.  Needs outpatient urology follow-up.   Home Health: PT,RN Equipment/Devices: Wheelchair, [employer] fast streamer, Foley catheter Consultations: None  Discharge Condition: Fair   CODE STATUS: DNR   Diet Recommendation: Soft  Diet Order            DIET SOFT Room service appropriate? No; Fluid consistency: Thin  Diet effective now                  Chief Complaint  Patient presents with  . Code Sepsis     Brief history of present illness from the day of admission and additional interim summary    Patient is a 86 y.o. female with PMHx of HLD, HTN-who has had progressive generalized weakness for approximately 1 week-apparently 4 prior to this hospitalization-patient sustained a mechanical fall-and was on the floor for almost 12 hours-patient was helped back to her bed by neighbors-since then she has been mostly bedbound/weak and frail-family subsequently called EMS and she was brought to the ED.  Upon further evaluation-she was found to have IRCVE-93 infection, complicated UTI-and admitted to the hospitalist service.  See below for further details.  COVID-19 vaccinated status: Unvaccinated (claims that she thinks vaccinated people get more Covid than the unvaccinated)  Significant  Events: 12/2>> Admit to [facility] for YBOFB-51 infection, complicated UTI, AKI  Significant studies: 12/2>>Chest x-ray: No active disease 12/3>> x-ray pelvis: No acute abnormality noted  COVID-19 medications: Monoclonal antibody infusion: 12/3 x 1  Antibiotics: Rocephin: 12/2>>  Microbiology data: 12/2 >>blood culture: No growth 12/2>> urine culture: [facility]   Sepsis from complicated UTI and WCHEN-27 infection: Continue supportive care-follow cultures.  Afebrile-sepsis pathophysiology has completely resolved.  Pansensitive E. coli UTI:   Rocephin x 5 days.  COVID-19 infection: she had very mild disease with elevated CRP only, was treated with a course of steroids which she has finished remained stable and no hypoxia.   Transaminitis - likely due to COVID-19 and mild rhabdomyolysis, holding statin, stable right upper quadrant ultrasound, negative hepatitis panel, stable INR, trend is improving.  HLD: Statin on hold due  to transaminitis.  AKI: With reported fall at home and prolonged downtime, mild rhabdo, solved after hydration with IV fluids.     Urinary retention.  Foley and Flomax, Foley placed 02/13/2020. Discontinue Foley on 02/16/2020 and monitor.  Elevated D-dimer.  Due to intense inflammation.  Lower extremity venous duplex negative.  Trend much improved.  HTN:  Continue home regimen at this point.  Severe debility/deconditioning: Due to advanced age, COVID-29 infection she is severely deconditioned and cannot stand or walk by herself, has minimum assistance at home and would ideally require SNF, however her insurance does not cover this benefit. I discussed this with health insurance MD Dr. Amalia Hailey on 02/17/2020 at 7:15 in the morning. He has thoroughly reviewed patient's chart and benefits and does not think she qualifies for SNF as she is not rehab appropriate and she will be a custodial  placement which her insurance does not approve, apparently he has reviewed her chart several months ago and family at that time was told that her insurance will not cover custodial care, apparently patient assets appropriate to cover for self-pay according to social work.  Hypernatremia. Resolved after hydration with D5W.   Discharge diagnosis     Principal Problem:   SIRS (systemic inflammatory response syndrome) (HCC) Active Problems:   ARF (acute renal failure) ([place])   Acute lower UTI   Hypertensive urgency   COVID    Discharge instructions    Discharge Instructions    Discharge instructions   Complete by: As directed    Follow with Primary MD [name], MD in 7 days   Get CBC, CMP, 2 view Chest X ray -  checked next visit within 1 week by Primary MD   Activity: As tolerated with Full fall precautions use walker/cane & assistance as needed  Disposition Home    Diet: Soft with feeding assistance and aspiration precautions.   Special Instructions: If you have smoked or chewed Tobacco  in the last 2 yrs please stop smoking, stop any regular Alcohol  and or any Recreational drug use.  On your next visit with your primary care physician please Get Medicines reviewed and adjusted.  Please request your Prim.MD to go over all Hospital Tests and Procedure/Radiological results at the follow up, please get all Hospital records sent to your Prim MD by signing hospital release before you go home.  If you experience worsening of your admission symptoms, develop shortness of breath, life threatening emergency, suicidal or homicidal thoughts you must seek medical attention immediately by calling 911 or calling your MD immediately  if symptoms less severe.  You Must read complete instructions/literature along with all the possible adverse reactions/side effects for all the Medicines you take and that have been prescribed to you. Take any new Medicines after you have completely  understood and accpet all the possible adverse reactions/side effects.   Increase activity slowly   Complete by: As directed    MyChart COVID-19 home monitoring program   Complete by: Feb 23, 2020    Is the patient willing to use the [place] for home monitoring?: Yes   Temperature monitoring   Complete by: Feb 23, 2020    After how many days would you like to receive a notification of this patient's flowsheet entries?: 1      Discharge Medications   Allergies as of 02/23/2020   No Known Allergies     Medication List    STOP taking these medications   ibuprofen 200 MG tablet  Commonly known as: ADVIL     TAKE these medications   acetaminophen 500 MG tablet Commonly known as: TYLENOL Take 1,000 mg by mouth every 6 (six) hours as needed for mild pain.   albuterol 108 (90 Base) MCG/ACT inhaler Commonly known as: VENTOLIN HFA Inhale 2 puffs into the lungs once as needed for wheezing or shortness of breath (for Grade 3 or 4 hypersensitivity reaction).   amLODipine 2.5 MG tablet Commonly known as: NORVASC Take 2.5 mg by mouth daily.   CLEAR EYES FOR DRY EYES OP Apply 1 drop to eye daily as needed (for dry eyes).   lisinopril 20 MG tablet Commonly known as: ZESTRIL Take 20 mg by mouth at bedtime.   lovastatin 20 MG tablet Commonly known as: MEVACOR Take 20 mg by mouth daily at 6 PM.   metoprolol tartrate 50 MG tablet Commonly known as: LOPRESSOR Take 50 mg by mouth 2 (two) times daily.   multivitamin with minerals Tabs tablet Take 1 tablet by mouth daily.   pantoprazole 40 MG tablet Commonly known as: Protonix Take 1 tablet (40 mg total) by mouth daily.   tamsulosin 0.4 MG Caps capsule Commonly known as: FLOMAX Take 1 capsule (0.4 mg total) by mouth daily. Start taking on: February 24, 2020            Durable Medical Equipment  (From admission, onward)         Start     Ordered   02/22/20 1103  For home use only DME standard manual  wheelchair with seat cushion  Once       Comments: Patient suffers from deconditioning which impairs their ability to perform daily activities like bathing, dressing, feeding, grooming, and toileting in the home.  A cane, crutch, or walker will not resolve issue with performing activities of daily living. A wheelchair will allow patient to safely perform daily activities. Patient can safely propel the wheelchair in the home or has a caregiver who can provide assistance. Length of need Lifetime. Accessories: elevating leg rests (ELRs), wheel locks, extensions and anti-tippers.   02/22/20 1103   02/22/20 1103  For home use only DME Other see comment  Once       Comments: Harrel Lemon lift  Question:  Length of Need  Answer:  6 Months   02/22/20 1103           Follow-up Information    [name], MD. Schedule an appointment as soon as possible for a visit in 1 week(s).   Specialty: Family Medicine Contact information: [address] [place] [address] [phone]        Alexis Frock, MD Follow up in 1 week(s).   Specialty: Urology Why: Urinary retention, has Foley. Contact information: [place] [address] [phone]               Major procedures and Radiology Reports - PLEASE review detailed and final reports thoroughly  -       DG Pelvis 1-2 Views  Result Date: 02/12/2020 CLINICAL DATA:  Weakness and foul-smelling urine EXAM: PELVIS - 1 VIEW COMPARISON:  09/03/2019 FINDINGS: There is no evidence of pelvic fracture or diastasis. No pelvic bone lesions are seen. IMPRESSION: No acute abnormality noted. Electronically Signed   By: Inez Catalina M.D.   On: 02/12/2020 02:13   CT HEAD WO CONTRAST  Result Date: 02/12/2020 CLINICAL DATA:  Weakness for 1 week EXAM: CT HEAD WITHOUT CONTRAST CT CERVICAL SPINE WITHOUT  CONTRAST TECHNIQUE: Multidetector CT imaging of the head and cervical spine was performed following the standard protocol without  intravenous contrast. Multiplanar CT image reconstructions of the cervical spine were also generated. COMPARISON:  None. FINDINGS: CT HEAD FINDINGS Brain: There is no mass, hemorrhage or extra-axial collection. The size and configuration of the ventricles and extra-axial CSF spaces are normal. There is hypoattenuation of the periventricular white matter, most commonly indicating chronic ischemic microangiopathy. Vascular: No abnormal hyperdensity of the major intracranial arteries or dural venous sinuses. No intracranial atherosclerosis. Skull: The visualized skull base, calvarium and extracranial soft tissues are normal. Sinuses/Orbits: No fluid levels or advanced mucosal thickening of the visualized paranasal sinuses. No mastoid or middle ear effusion. The orbits are normal. CT CERVICAL SPINE FINDINGS Alignment: No static subluxation. Facets are aligned. Occipital condyles are normally positioned. Skull base and vertebrae: No acute fracture. Soft tissues and spinal canal: No prevertebral fluid or swelling. No visible canal hematoma. Disc levels: No advanced spinal canal or neural foraminal stenosis. Upper chest: No pneumothorax, pulmonary nodule or pleural effusion. Other: Calcific aortic atherosclerosis. IMPRESSION: 1. Chronic ischemic microangiopathy without acute intracranial abnormality. 2. No acute fracture or static subluxation of the cervical spine. Aortic Atherosclerosis (ICD10-I70.0). Electronically Signed   By: Ulyses Jarred M.D.   On: 02/12/2020 01:11   CT CERVICAL SPINE WO CONTRAST  Result Date: 02/12/2020 CLINICAL DATA:  Weakness for 1 week EXAM: CT HEAD WITHOUT CONTRAST CT CERVICAL SPINE WITHOUT CONTRAST TECHNIQUE: Multidetector CT imaging of the head and cervical spine was performed following the standard protocol without intravenous contrast. Multiplanar CT image reconstructions of the cervical spine were also generated. COMPARISON:  None. FINDINGS: CT HEAD FINDINGS Brain: There is no mass,  hemorrhage or extra-axial collection. The size and configuration of the ventricles and extra-axial CSF spaces are normal. There is hypoattenuation of the periventricular white matter, most commonly indicating chronic ischemic microangiopathy. Vascular: No abnormal hyperdensity of the major intracranial arteries or dural venous sinuses. No intracranial atherosclerosis. Skull: The visualized skull base, calvarium and extracranial soft tissues are normal. Sinuses/Orbits: No fluid levels or advanced mucosal thickening of the visualized paranasal sinuses. No mastoid or middle ear effusion. The orbits are normal. CT CERVICAL SPINE FINDINGS Alignment: No static subluxation. Facets are aligned. Occipital condyles are normally positioned. Skull base and vertebrae: No acute fracture. Soft tissues and spinal canal: No prevertebral fluid or swelling. No visible canal hematoma. Disc levels: No advanced spinal canal or neural foraminal stenosis. Upper chest: No pneumothorax, pulmonary nodule or pleural effusion. Other: Calcific aortic atherosclerosis. IMPRESSION: 1. Chronic ischemic microangiopathy without acute intracranial abnormality. 2. No acute fracture or static subluxation of the cervical spine. Aortic Atherosclerosis (ICD10-I70.0). Electronically Signed   By: Ulyses Jarred M.D.   On: 02/12/2020 01:11   US RENAL  Result Date: 02/13/2020 CLINICAL DATA:  Acute kidney injury. EXAM: RENAL / URINARY TRACT ULTRASOUND COMPLETE COMPARISON:  None. FINDINGS: Right Kidney: Renal measurements: 9.9 x 4.7 x 4.9 cm = volume: 118.5 mL. Mild renal cortical thinning. Mild to moderate hydronephrosis. No mass, no mass or stone. Normal parenchymal echogenicity. Trace amount of nonspecific perinephric edema or fluid. Left Kidney: Renal measurements: 10.2 x 4.7 x 4.3 cm = volume: 111.4 mL. Renal cortical thinning. Slight dilation of the intrarenal collecting system. Normal parenchymal echogenicity. No mass or stone. Trace amount of  perinephric fluid or edema. Bladder: Distended. Small amount of dependent debris. No mass or stone. Ureteral jets not visualized. Other: None. IMPRESSION: 1. Mild to moderate right and  slight left renal collecting system dilation, suspected to be on the basis of bladder distension. No renal masses or stones. 2. Renal cortical thinning, left greater than right. Electronically Signed   By: Lajean Manes M.D.   On: 02/13/2020 11:24   DG Chest Port 1 View  Result Date: 02/13/2020 CLINICAL DATA:  Shortness of breath, COVID-19 positivity EXAM: PORTABLE CHEST 1 VIEW COMPARISON:  02/11/2020 FINDINGS: Cardiac shadow is stable. Aortic calcifications are noted. The lungs are well aerated bilaterally. No focal infiltrate or sizable effusion is seen. No acute bony abnormality is seen. IMPRESSION: No acute abnormality noted. Electronically Signed   By: Inez Catalina M.D.   On: 02/13/2020 08:52   DG Chest Portable 1 View  Result Date: 02/11/2020 CLINICAL DATA:  Weakness. EXAM: PORTABLE CHEST 1 VIEW COMPARISON:  CT chest 12/0 scratch the CT chest 02/19/2019. FINDINGS: The lungs clear. Heart size normal. Aortic atherosclerosis. No pneumothorax or pleural fluid. IMPRESSION: No acute disease. Aortic Atherosclerosis (ICD10-I70.0). Electronically Signed   By: Inge Rise M.D.   On: 02/11/2020 17:46   VAS [place] LOWER EXTREMITY VENOUS (DVT)  Result Date: 02/15/2020  Lower Venous DVT Study Indications: Swelling.  Risk Factors: COVID 19 positive. Comparison Study: No prior studies. Performing Technologist: Oliver Hum RVT  Examination Guidelines: A complete evaluation includes B-mode imaging, spectral Doppler, color Doppler, and power Doppler as needed of all accessible portions of each vessel. Bilateral testing is considered an integral part of a complete examination. Limited examinations for reoccurring indications may be performed as noted. The reflux portion of the exam is performed with the patient in reverse  Trendelenburg.  +---------+---------------+---------+-----------+----------+--------------+ RIGHT    CompressibilityPhasicitySpontaneityPropertiesThrombus Aging +---------+---------------+---------+-----------+----------+--------------+ CFV      Full           Yes      Yes                                 +---------+---------------+---------+-----------+----------+--------------+ SFJ      Full                                                        +---------+---------------+---------+-----------+----------+--------------+ FV Prox  Full                                                        +---------+---------------+---------+-----------+----------+--------------+ FV Mid   Full                                                        +---------+---------------+---------+-----------+----------+--------------+ FV DistalFull                                                        +---------+---------------+---------+-----------+----------+--------------+ PFV      Full                                                        +---------+---------------+---------+-----------+----------+--------------+  POP      Full           Yes      Yes                                 +---------+---------------+---------+-----------+----------+--------------+ PTV      Full                                                        +---------+---------------+---------+-----------+----------+--------------+ PERO     Full                                                        +---------+---------------+---------+-----------+----------+--------------+   +---------+---------------+---------+-----------+----------+--------------+ LEFT     CompressibilityPhasicitySpontaneityPropertiesThrombus Aging +---------+---------------+---------+-----------+----------+--------------+ CFV      Full           Yes      Yes                                  +---------+---------------+---------+-----------+----------+--------------+ SFJ      Full                                                        +---------+---------------+---------+-----------+----------+--------------+ FV Prox  Full                                                        +---------+---------------+---------+-----------+----------+--------------+ FV Mid   Full                                                        +---------+---------------+---------+-----------+----------+--------------+ FV DistalFull                                                        +---------+---------------+---------+-----------+----------+--------------+ PFV      Full                                                        +---------+---------------+---------+-----------+----------+--------------+ POP      Full           Yes      Yes                                 +---------+---------------+---------+-----------+----------+--------------+  PTV      Full                                                        +---------+---------------+---------+-----------+----------+--------------+ PERO     Full                                                        +---------+---------------+---------+-----------+----------+--------------+     Summary: RIGHT: - There is no evidence of deep vein thrombosis in the lower extremity.  - No cystic structure found in the popliteal fossa.  LEFT: - There is no evidence of deep vein thrombosis in the lower extremity.  - No cystic structure found in the popliteal fossa.  *See table(s) above for measurements and observations. Electronically signed by Ruta Hinds MD on 02/15/2020 at 5:00:56 PM.    Final    US Abdomen Limited RUQ (LIVER/GB)  Result Date: 02/14/2020 CLINICAL DATA:  Transaminitis EXAM: ULTRASOUND ABDOMEN LIMITED RIGHT UPPER QUADRANT COMPARISON:  None. FINDINGS: Gallbladder: No gallstones or wall thickening visualized. No  sonographic Murphy sign noted by sonographer. Common bile duct: Diameter: 6 mm Liver: No focal lesion identified. Within normal limits in parenchymal echogenicity. Portal vein is patent on color Doppler imaging with normal direction of blood flow towards the liver. Other: Incidentally noted is a right-sided pleural effusion. IMPRESSION: 1. No specific abnormality to explain the patient's transaminitis. 2. Incidentally noted right-sided pleural effusion. Electronically Signed   By: Constance Holster M.D.   On: 02/14/2020 19:05    Micro Results     No results found for this or any previous visit (from the past 240 hour(s)).  Today   Subjective    Sylvia Petersen today has no headache,no chest abdominal pain,no new weakness tingling or numbness, feels much better     Objective   Blood pressure 127/63, pulse 67, temperature 97.8 F (36.6 C), temperature source Axillary, resp. rate 20, height 5\' 2"  (1.575 m), weight 53.1 kg, SpO2 95 %.   Intake/Output Summary (Last 24 hours) at 02/23/2020 1047 Last data filed at 02/23/2020 0930 Gross per 24 hour  Intake 240 ml  Output 750 ml  Net -510 ml    Exam  Awake Alert, appears extremely frail and deconditioned, trying to eat breakfast, no focal deficits, Foley catheter in place [place].AT,PERRAL Supple Neck,No JVD, No cervical lymphadenopathy appriciated.  Symmetrical Chest wall movement, Good air movement bilaterally, CTAB RRR,No Gallops,Rubs or new Murmurs, No Parasternal Heave +ve B.Sounds, Abd Soft, Non tender, No organomegaly appriciated, No rebound -guarding or rigidity. No Cyanosis, Clubbing or edema, No new Rash or bruise   Data Review   CBC w Diff:  Lab Results  Component Value Date   WBC 8.4 02/21/2020   HGB 11.3 (L) 02/21/2020   HCT 34.8 (L) 02/21/2020   PLT 429 (H) 02/21/2020   LYMPHOPCT 6 02/17/2020   MONOPCT 6 02/17/2020   EOSPCT 0 02/17/2020   BASOPCT 0 02/17/2020    CMP:  Lab Results  Component Value Date   NA 136  02/21/2020   K 4.0 02/21/2020   CL 105 02/21/2020   CO2 21 (L) 02/21/2020   BUN 27 (H)  02/21/2020   CREATININE 0.72 02/21/2020   PROT 5.7 (L) 02/17/2020   ALBUMIN 2.5 (L) 02/17/2020   BILITOT 0.5 02/17/2020   ALKPHOS 55 02/17/2020   AST 79 (H) 02/17/2020   ALT 164 (H) 02/17/2020  .   Total Time in preparing paper work, data evaluation and todays exam - 73 minutes  Lala Lund M.D on 02/23/2020 at 10:47 AM  [facility]

## 2020-02-25 DIAGNOSIS — I10 Essential (primary) hypertension: Secondary | ICD-10-CM | POA: Diagnosis not present

## 2020-02-25 DIAGNOSIS — E7800 Pure hypercholesterolemia, unspecified: Secondary | ICD-10-CM | POA: Diagnosis not present

## 2020-02-25 DIAGNOSIS — I700 Atherosclerosis of aorta: Secondary | ICD-10-CM | POA: Diagnosis not present

## 2020-02-25 DIAGNOSIS — Z9181 History of falling: Secondary | ICD-10-CM | POA: Diagnosis not present

## 2020-02-25 DIAGNOSIS — R634 Abnormal weight loss: Secondary | ICD-10-CM | POA: Diagnosis not present

## 2020-02-25 DIAGNOSIS — Z85820 Personal history of malignant melanoma of skin: Secondary | ICD-10-CM | POA: Diagnosis not present

## 2020-02-25 DIAGNOSIS — M8580 Other specified disorders of bone density and structure, unspecified site: Secondary | ICD-10-CM | POA: Diagnosis not present

## 2020-02-25 DIAGNOSIS — U099 Post covid-19 condition, unspecified: Secondary | ICD-10-CM | POA: Diagnosis not present

## 2020-02-25 DIAGNOSIS — Z681 Body mass index (BMI) 19 or less, adult: Secondary | ICD-10-CM | POA: Diagnosis not present

## 2020-02-25 DIAGNOSIS — Z960 Presence of urogenital implants: Secondary | ICD-10-CM | POA: Diagnosis not present

## 2020-02-25 DIAGNOSIS — N3281 Overactive bladder: Secondary | ICD-10-CM | POA: Diagnosis not present

## 2020-02-26 DIAGNOSIS — U071 COVID-19: Secondary | ICD-10-CM | POA: Diagnosis not present

## 2020-02-26 DIAGNOSIS — R6510 Systemic inflammatory response syndrome (SIRS) of non-infectious origin without acute organ dysfunction: Secondary | ICD-10-CM | POA: Diagnosis not present

## 2020-02-26 DIAGNOSIS — N179 Acute kidney failure, unspecified: Secondary | ICD-10-CM | POA: Diagnosis not present

## 2020-03-02 DIAGNOSIS — Z9181 History of falling: Secondary | ICD-10-CM | POA: Diagnosis not present

## 2020-03-02 DIAGNOSIS — I700 Atherosclerosis of aorta: Secondary | ICD-10-CM | POA: Diagnosis not present

## 2020-03-02 DIAGNOSIS — Z960 Presence of urogenital implants: Secondary | ICD-10-CM | POA: Diagnosis not present

## 2020-03-02 DIAGNOSIS — I10 Essential (primary) hypertension: Secondary | ICD-10-CM | POA: Diagnosis not present

## 2020-03-02 DIAGNOSIS — M8580 Other specified disorders of bone density and structure, unspecified site: Secondary | ICD-10-CM | POA: Diagnosis not present

## 2020-03-02 DIAGNOSIS — Z85820 Personal history of malignant melanoma of skin: Secondary | ICD-10-CM | POA: Diagnosis not present

## 2020-03-02 DIAGNOSIS — E7800 Pure hypercholesterolemia, unspecified: Secondary | ICD-10-CM | POA: Diagnosis not present

## 2020-03-02 DIAGNOSIS — R634 Abnormal weight loss: Secondary | ICD-10-CM | POA: Diagnosis not present

## 2020-03-02 DIAGNOSIS — Z681 Body mass index (BMI) 19 or less, adult: Secondary | ICD-10-CM | POA: Diagnosis not present

## 2020-03-02 DIAGNOSIS — N3281 Overactive bladder: Secondary | ICD-10-CM | POA: Diagnosis not present

## 2020-03-02 DIAGNOSIS — U099 Post covid-19 condition, unspecified: Secondary | ICD-10-CM | POA: Diagnosis not present

## 2020-03-09 DIAGNOSIS — M8580 Other specified disorders of bone density and structure, unspecified site: Secondary | ICD-10-CM | POA: Diagnosis not present

## 2020-03-09 DIAGNOSIS — I10 Essential (primary) hypertension: Secondary | ICD-10-CM | POA: Diagnosis not present

## 2020-03-09 DIAGNOSIS — E7800 Pure hypercholesterolemia, unspecified: Secondary | ICD-10-CM | POA: Diagnosis not present

## 2020-03-11 DIAGNOSIS — R634 Abnormal weight loss: Secondary | ICD-10-CM | POA: Diagnosis not present

## 2020-03-11 DIAGNOSIS — N3281 Overactive bladder: Secondary | ICD-10-CM | POA: Diagnosis not present

## 2020-03-11 DIAGNOSIS — Z681 Body mass index (BMI) 19 or less, adult: Secondary | ICD-10-CM | POA: Diagnosis not present

## 2020-03-11 DIAGNOSIS — U099 Post covid-19 condition, unspecified: Secondary | ICD-10-CM | POA: Diagnosis not present

## 2020-03-11 DIAGNOSIS — M8580 Other specified disorders of bone density and structure, unspecified site: Secondary | ICD-10-CM | POA: Diagnosis not present

## 2020-03-11 DIAGNOSIS — Z9181 History of falling: Secondary | ICD-10-CM | POA: Diagnosis not present

## 2020-03-11 DIAGNOSIS — Z85820 Personal history of malignant melanoma of skin: Secondary | ICD-10-CM | POA: Diagnosis not present

## 2020-03-11 DIAGNOSIS — E7800 Pure hypercholesterolemia, unspecified: Secondary | ICD-10-CM | POA: Diagnosis not present

## 2020-03-11 DIAGNOSIS — I10 Essential (primary) hypertension: Secondary | ICD-10-CM | POA: Diagnosis not present

## 2020-03-11 DIAGNOSIS — Z960 Presence of urogenital implants: Secondary | ICD-10-CM | POA: Diagnosis not present

## 2020-03-11 DIAGNOSIS — I700 Atherosclerosis of aorta: Secondary | ICD-10-CM | POA: Diagnosis not present

## 2020-03-15 DIAGNOSIS — R634 Abnormal weight loss: Secondary | ICD-10-CM | POA: Diagnosis not present

## 2020-03-15 DIAGNOSIS — I10 Essential (primary) hypertension: Secondary | ICD-10-CM | POA: Diagnosis not present

## 2020-03-15 DIAGNOSIS — Z9181 History of falling: Secondary | ICD-10-CM | POA: Diagnosis not present

## 2020-03-15 DIAGNOSIS — Z85820 Personal history of malignant melanoma of skin: Secondary | ICD-10-CM | POA: Diagnosis not present

## 2020-03-15 DIAGNOSIS — N3281 Overactive bladder: Secondary | ICD-10-CM | POA: Diagnosis not present

## 2020-03-15 DIAGNOSIS — I700 Atherosclerosis of aorta: Secondary | ICD-10-CM | POA: Diagnosis not present

## 2020-03-15 DIAGNOSIS — Z960 Presence of urogenital implants: Secondary | ICD-10-CM | POA: Diagnosis not present

## 2020-03-15 DIAGNOSIS — U099 Post covid-19 condition, unspecified: Secondary | ICD-10-CM | POA: Diagnosis not present

## 2020-03-15 DIAGNOSIS — E7800 Pure hypercholesterolemia, unspecified: Secondary | ICD-10-CM | POA: Diagnosis not present

## 2020-03-15 DIAGNOSIS — M8580 Other specified disorders of bone density and structure, unspecified site: Secondary | ICD-10-CM | POA: Diagnosis not present

## 2020-03-15 DIAGNOSIS — Z681 Body mass index (BMI) 19 or less, adult: Secondary | ICD-10-CM | POA: Diagnosis not present

## 2020-03-17 DIAGNOSIS — Z741 Need for assistance with personal care: Secondary | ICD-10-CM | POA: Diagnosis not present

## 2020-03-17 DIAGNOSIS — I10 Essential (primary) hypertension: Secondary | ICD-10-CM | POA: Diagnosis not present

## 2020-03-28 DIAGNOSIS — N179 Acute kidney failure, unspecified: Secondary | ICD-10-CM | POA: Diagnosis not present

## 2020-03-28 DIAGNOSIS — R6510 Systemic inflammatory response syndrome (SIRS) of non-infectious origin without acute organ dysfunction: Secondary | ICD-10-CM | POA: Diagnosis not present

## 2020-03-28 DIAGNOSIS — U071 COVID-19: Secondary | ICD-10-CM | POA: Diagnosis not present

## 2020-04-13 DIAGNOSIS — Z960 Presence of urogenital implants: Secondary | ICD-10-CM | POA: Diagnosis not present

## 2020-04-13 DIAGNOSIS — Z9181 History of falling: Secondary | ICD-10-CM | POA: Diagnosis not present

## 2020-04-13 DIAGNOSIS — E7800 Pure hypercholesterolemia, unspecified: Secondary | ICD-10-CM | POA: Diagnosis not present

## 2020-04-13 DIAGNOSIS — R634 Abnormal weight loss: Secondary | ICD-10-CM | POA: Diagnosis not present

## 2020-04-13 DIAGNOSIS — Z85820 Personal history of malignant melanoma of skin: Secondary | ICD-10-CM | POA: Diagnosis not present

## 2020-04-13 DIAGNOSIS — I700 Atherosclerosis of aorta: Secondary | ICD-10-CM | POA: Diagnosis not present

## 2020-04-13 DIAGNOSIS — N3281 Overactive bladder: Secondary | ICD-10-CM | POA: Diagnosis not present

## 2020-04-13 DIAGNOSIS — M8580 Other specified disorders of bone density and structure, unspecified site: Secondary | ICD-10-CM | POA: Diagnosis not present

## 2020-04-13 DIAGNOSIS — Z681 Body mass index (BMI) 19 or less, adult: Secondary | ICD-10-CM | POA: Diagnosis not present

## 2020-04-13 DIAGNOSIS — U099 Post covid-19 condition, unspecified: Secondary | ICD-10-CM | POA: Diagnosis not present

## 2020-04-13 DIAGNOSIS — I10 Essential (primary) hypertension: Secondary | ICD-10-CM | POA: Diagnosis not present

## 2020-04-19 DIAGNOSIS — I700 Atherosclerosis of aorta: Secondary | ICD-10-CM | POA: Diagnosis not present

## 2020-04-19 DIAGNOSIS — Z741 Need for assistance with personal care: Secondary | ICD-10-CM | POA: Diagnosis not present

## 2020-04-19 DIAGNOSIS — Z0000 Encounter for general adult medical examination without abnormal findings: Secondary | ICD-10-CM | POA: Diagnosis not present

## 2020-04-19 DIAGNOSIS — E7800 Pure hypercholesterolemia, unspecified: Secondary | ICD-10-CM | POA: Diagnosis not present

## 2020-04-19 DIAGNOSIS — Z1389 Encounter for screening for other disorder: Secondary | ICD-10-CM | POA: Diagnosis not present

## 2020-04-19 DIAGNOSIS — I10 Essential (primary) hypertension: Secondary | ICD-10-CM | POA: Diagnosis not present

## 2020-04-19 DIAGNOSIS — R29898 Other symptoms and signs involving the musculoskeletal system: Secondary | ICD-10-CM | POA: Diagnosis not present

## 2020-04-19 DIAGNOSIS — N8110 Cystocele, unspecified: Secondary | ICD-10-CM | POA: Diagnosis not present

## 2020-04-19 DIAGNOSIS — N3281 Overactive bladder: Secondary | ICD-10-CM | POA: Diagnosis not present

## 2020-04-26 DIAGNOSIS — Z8616 Personal history of COVID-19: Secondary | ICD-10-CM | POA: Diagnosis not present

## 2020-04-26 DIAGNOSIS — Z9841 Cataract extraction status, right eye: Secondary | ICD-10-CM | POA: Diagnosis not present

## 2020-04-26 DIAGNOSIS — N3281 Overactive bladder: Secondary | ICD-10-CM | POA: Diagnosis not present

## 2020-04-26 DIAGNOSIS — I700 Atherosclerosis of aorta: Secondary | ICD-10-CM | POA: Diagnosis not present

## 2020-04-26 DIAGNOSIS — M8580 Other specified disorders of bone density and structure, unspecified site: Secondary | ICD-10-CM | POA: Diagnosis not present

## 2020-04-26 DIAGNOSIS — N8110 Cystocele, unspecified: Secondary | ICD-10-CM | POA: Diagnosis not present

## 2020-04-26 DIAGNOSIS — Z87440 Personal history of urinary (tract) infections: Secondary | ICD-10-CM | POA: Diagnosis not present

## 2020-04-26 DIAGNOSIS — Z9181 History of falling: Secondary | ICD-10-CM | POA: Diagnosis not present

## 2020-04-26 DIAGNOSIS — K219 Gastro-esophageal reflux disease without esophagitis: Secondary | ICD-10-CM | POA: Diagnosis not present

## 2020-04-26 DIAGNOSIS — Z85820 Personal history of malignant melanoma of skin: Secondary | ICD-10-CM | POA: Diagnosis not present

## 2020-04-26 DIAGNOSIS — I10 Essential (primary) hypertension: Secondary | ICD-10-CM | POA: Diagnosis not present

## 2020-04-26 DIAGNOSIS — Z90710 Acquired absence of both cervix and uterus: Secondary | ICD-10-CM | POA: Diagnosis not present

## 2020-04-26 DIAGNOSIS — E7800 Pure hypercholesterolemia, unspecified: Secondary | ICD-10-CM | POA: Diagnosis not present

## 2020-04-26 DIAGNOSIS — R29898 Other symptoms and signs involving the musculoskeletal system: Secondary | ICD-10-CM | POA: Diagnosis not present

## 2020-04-26 DIAGNOSIS — E46 Unspecified protein-calorie malnutrition: Secondary | ICD-10-CM | POA: Diagnosis not present

## 2020-04-26 DIAGNOSIS — Z982 Presence of cerebrospinal fluid drainage device: Secondary | ICD-10-CM | POA: Diagnosis not present

## 2020-04-28 DIAGNOSIS — U071 COVID-19: Secondary | ICD-10-CM | POA: Diagnosis not present

## 2020-04-28 DIAGNOSIS — N179 Acute kidney failure, unspecified: Secondary | ICD-10-CM | POA: Diagnosis not present

## 2020-04-28 DIAGNOSIS — R6510 Systemic inflammatory response syndrome (SIRS) of non-infectious origin without acute organ dysfunction: Secondary | ICD-10-CM | POA: Diagnosis not present

## 2020-05-03 DIAGNOSIS — E46 Unspecified protein-calorie malnutrition: Secondary | ICD-10-CM | POA: Diagnosis not present

## 2020-05-03 DIAGNOSIS — Z85820 Personal history of malignant melanoma of skin: Secondary | ICD-10-CM | POA: Diagnosis not present

## 2020-05-03 DIAGNOSIS — Z87440 Personal history of urinary (tract) infections: Secondary | ICD-10-CM | POA: Diagnosis not present

## 2020-05-03 DIAGNOSIS — Z982 Presence of cerebrospinal fluid drainage device: Secondary | ICD-10-CM | POA: Diagnosis not present

## 2020-05-03 DIAGNOSIS — Z9841 Cataract extraction status, right eye: Secondary | ICD-10-CM | POA: Diagnosis not present

## 2020-05-03 DIAGNOSIS — N3281 Overactive bladder: Secondary | ICD-10-CM | POA: Diagnosis not present

## 2020-05-03 DIAGNOSIS — I10 Essential (primary) hypertension: Secondary | ICD-10-CM | POA: Diagnosis not present

## 2020-05-03 DIAGNOSIS — K219 Gastro-esophageal reflux disease without esophagitis: Secondary | ICD-10-CM | POA: Diagnosis not present

## 2020-05-03 DIAGNOSIS — E7800 Pure hypercholesterolemia, unspecified: Secondary | ICD-10-CM | POA: Diagnosis not present

## 2020-05-03 DIAGNOSIS — I700 Atherosclerosis of aorta: Secondary | ICD-10-CM | POA: Diagnosis not present

## 2020-05-03 DIAGNOSIS — Z8616 Personal history of COVID-19: Secondary | ICD-10-CM | POA: Diagnosis not present

## 2020-05-03 DIAGNOSIS — M8580 Other specified disorders of bone density and structure, unspecified site: Secondary | ICD-10-CM | POA: Diagnosis not present

## 2020-05-03 DIAGNOSIS — N8110 Cystocele, unspecified: Secondary | ICD-10-CM | POA: Diagnosis not present

## 2020-05-03 DIAGNOSIS — R29898 Other symptoms and signs involving the musculoskeletal system: Secondary | ICD-10-CM | POA: Diagnosis not present

## 2020-05-03 DIAGNOSIS — Z90710 Acquired absence of both cervix and uterus: Secondary | ICD-10-CM | POA: Diagnosis not present

## 2020-05-03 DIAGNOSIS — Z9181 History of falling: Secondary | ICD-10-CM | POA: Diagnosis not present

## 2020-05-10 DIAGNOSIS — Z9181 History of falling: Secondary | ICD-10-CM | POA: Diagnosis not present

## 2020-05-10 DIAGNOSIS — M8580 Other specified disorders of bone density and structure, unspecified site: Secondary | ICD-10-CM | POA: Diagnosis not present

## 2020-05-10 DIAGNOSIS — R29898 Other symptoms and signs involving the musculoskeletal system: Secondary | ICD-10-CM | POA: Diagnosis not present

## 2020-05-10 DIAGNOSIS — E46 Unspecified protein-calorie malnutrition: Secondary | ICD-10-CM | POA: Diagnosis not present

## 2020-05-10 DIAGNOSIS — N8110 Cystocele, unspecified: Secondary | ICD-10-CM | POA: Diagnosis not present

## 2020-05-10 DIAGNOSIS — Z8616 Personal history of COVID-19: Secondary | ICD-10-CM | POA: Diagnosis not present

## 2020-05-10 DIAGNOSIS — I700 Atherosclerosis of aorta: Secondary | ICD-10-CM | POA: Diagnosis not present

## 2020-05-10 DIAGNOSIS — K219 Gastro-esophageal reflux disease without esophagitis: Secondary | ICD-10-CM | POA: Diagnosis not present

## 2020-05-10 DIAGNOSIS — Z982 Presence of cerebrospinal fluid drainage device: Secondary | ICD-10-CM | POA: Diagnosis not present

## 2020-05-10 DIAGNOSIS — I10 Essential (primary) hypertension: Secondary | ICD-10-CM | POA: Diagnosis not present

## 2020-05-10 DIAGNOSIS — Z85820 Personal history of malignant melanoma of skin: Secondary | ICD-10-CM | POA: Diagnosis not present

## 2020-05-10 DIAGNOSIS — Z90710 Acquired absence of both cervix and uterus: Secondary | ICD-10-CM | POA: Diagnosis not present

## 2020-05-10 DIAGNOSIS — Z9841 Cataract extraction status, right eye: Secondary | ICD-10-CM | POA: Diagnosis not present

## 2020-05-10 DIAGNOSIS — N3281 Overactive bladder: Secondary | ICD-10-CM | POA: Diagnosis not present

## 2020-05-10 DIAGNOSIS — Z741 Need for assistance with personal care: Secondary | ICD-10-CM | POA: Diagnosis not present

## 2020-05-10 DIAGNOSIS — Z87440 Personal history of urinary (tract) infections: Secondary | ICD-10-CM | POA: Diagnosis not present

## 2020-05-10 DIAGNOSIS — E7800 Pure hypercholesterolemia, unspecified: Secondary | ICD-10-CM | POA: Diagnosis not present

## 2020-05-11 DIAGNOSIS — E7800 Pure hypercholesterolemia, unspecified: Secondary | ICD-10-CM | POA: Diagnosis not present

## 2020-05-11 DIAGNOSIS — K219 Gastro-esophageal reflux disease without esophagitis: Secondary | ICD-10-CM | POA: Diagnosis not present

## 2020-05-11 DIAGNOSIS — M8580 Other specified disorders of bone density and structure, unspecified site: Secondary | ICD-10-CM | POA: Diagnosis not present

## 2020-05-11 DIAGNOSIS — I10 Essential (primary) hypertension: Secondary | ICD-10-CM | POA: Diagnosis not present

## 2020-05-26 DIAGNOSIS — U071 COVID-19: Secondary | ICD-10-CM | POA: Diagnosis not present

## 2020-05-26 DIAGNOSIS — R6510 Systemic inflammatory response syndrome (SIRS) of non-infectious origin without acute organ dysfunction: Secondary | ICD-10-CM | POA: Diagnosis not present

## 2020-05-26 DIAGNOSIS — N179 Acute kidney failure, unspecified: Secondary | ICD-10-CM | POA: Diagnosis not present

## 2020-06-08 DIAGNOSIS — Z8616 Personal history of COVID-19: Secondary | ICD-10-CM | POA: Diagnosis not present

## 2020-06-08 DIAGNOSIS — Z87440 Personal history of urinary (tract) infections: Secondary | ICD-10-CM | POA: Diagnosis not present

## 2020-06-08 DIAGNOSIS — Z90710 Acquired absence of both cervix and uterus: Secondary | ICD-10-CM | POA: Diagnosis not present

## 2020-06-08 DIAGNOSIS — E7800 Pure hypercholesterolemia, unspecified: Secondary | ICD-10-CM | POA: Diagnosis not present

## 2020-06-08 DIAGNOSIS — N8110 Cystocele, unspecified: Secondary | ICD-10-CM | POA: Diagnosis not present

## 2020-06-08 DIAGNOSIS — K219 Gastro-esophageal reflux disease without esophagitis: Secondary | ICD-10-CM | POA: Diagnosis not present

## 2020-06-08 DIAGNOSIS — E46 Unspecified protein-calorie malnutrition: Secondary | ICD-10-CM | POA: Diagnosis not present

## 2020-06-08 DIAGNOSIS — Z9181 History of falling: Secondary | ICD-10-CM | POA: Diagnosis not present

## 2020-06-08 DIAGNOSIS — N3281 Overactive bladder: Secondary | ICD-10-CM | POA: Diagnosis not present

## 2020-06-08 DIAGNOSIS — Z85820 Personal history of malignant melanoma of skin: Secondary | ICD-10-CM | POA: Diagnosis not present

## 2020-06-08 DIAGNOSIS — M8580 Other specified disorders of bone density and structure, unspecified site: Secondary | ICD-10-CM | POA: Diagnosis not present

## 2020-06-08 DIAGNOSIS — Z9841 Cataract extraction status, right eye: Secondary | ICD-10-CM | POA: Diagnosis not present

## 2020-06-08 DIAGNOSIS — Z982 Presence of cerebrospinal fluid drainage device: Secondary | ICD-10-CM | POA: Diagnosis not present

## 2020-06-08 DIAGNOSIS — R29898 Other symptoms and signs involving the musculoskeletal system: Secondary | ICD-10-CM | POA: Diagnosis not present

## 2020-06-08 DIAGNOSIS — I10 Essential (primary) hypertension: Secondary | ICD-10-CM | POA: Diagnosis not present

## 2020-06-08 DIAGNOSIS — I700 Atherosclerosis of aorta: Secondary | ICD-10-CM | POA: Diagnosis not present

## 2020-06-15 DIAGNOSIS — Z9181 History of falling: Secondary | ICD-10-CM | POA: Diagnosis not present

## 2020-06-15 DIAGNOSIS — Z87440 Personal history of urinary (tract) infections: Secondary | ICD-10-CM | POA: Diagnosis not present

## 2020-06-15 DIAGNOSIS — Z982 Presence of cerebrospinal fluid drainage device: Secondary | ICD-10-CM | POA: Diagnosis not present

## 2020-06-15 DIAGNOSIS — N8110 Cystocele, unspecified: Secondary | ICD-10-CM | POA: Diagnosis not present

## 2020-06-15 DIAGNOSIS — K219 Gastro-esophageal reflux disease without esophagitis: Secondary | ICD-10-CM | POA: Diagnosis not present

## 2020-06-15 DIAGNOSIS — I10 Essential (primary) hypertension: Secondary | ICD-10-CM | POA: Diagnosis not present

## 2020-06-15 DIAGNOSIS — I700 Atherosclerosis of aorta: Secondary | ICD-10-CM | POA: Diagnosis not present

## 2020-06-15 DIAGNOSIS — E7800 Pure hypercholesterolemia, unspecified: Secondary | ICD-10-CM | POA: Diagnosis not present

## 2020-06-15 DIAGNOSIS — Z85820 Personal history of malignant melanoma of skin: Secondary | ICD-10-CM | POA: Diagnosis not present

## 2020-06-15 DIAGNOSIS — Z90710 Acquired absence of both cervix and uterus: Secondary | ICD-10-CM | POA: Diagnosis not present

## 2020-06-15 DIAGNOSIS — Z8616 Personal history of COVID-19: Secondary | ICD-10-CM | POA: Diagnosis not present

## 2020-06-15 DIAGNOSIS — Z9841 Cataract extraction status, right eye: Secondary | ICD-10-CM | POA: Diagnosis not present

## 2020-06-15 DIAGNOSIS — M8580 Other specified disorders of bone density and structure, unspecified site: Secondary | ICD-10-CM | POA: Diagnosis not present

## 2020-06-15 DIAGNOSIS — R29898 Other symptoms and signs involving the musculoskeletal system: Secondary | ICD-10-CM | POA: Diagnosis not present

## 2020-06-15 DIAGNOSIS — N3281 Overactive bladder: Secondary | ICD-10-CM | POA: Diagnosis not present

## 2020-06-15 DIAGNOSIS — E46 Unspecified protein-calorie malnutrition: Secondary | ICD-10-CM | POA: Diagnosis not present

## 2020-06-26 DIAGNOSIS — N179 Acute kidney failure, unspecified: Secondary | ICD-10-CM | POA: Diagnosis not present

## 2020-06-26 DIAGNOSIS — R6510 Systemic inflammatory response syndrome (SIRS) of non-infectious origin without acute organ dysfunction: Secondary | ICD-10-CM | POA: Diagnosis not present

## 2020-06-26 DIAGNOSIS — U071 COVID-19: Secondary | ICD-10-CM | POA: Diagnosis not present

## 2020-07-07 DIAGNOSIS — E7800 Pure hypercholesterolemia, unspecified: Secondary | ICD-10-CM | POA: Diagnosis not present

## 2020-07-07 DIAGNOSIS — M8580 Other specified disorders of bone density and structure, unspecified site: Secondary | ICD-10-CM | POA: Diagnosis not present

## 2020-07-07 DIAGNOSIS — K219 Gastro-esophageal reflux disease without esophagitis: Secondary | ICD-10-CM | POA: Diagnosis not present

## 2020-07-07 DIAGNOSIS — I10 Essential (primary) hypertension: Secondary | ICD-10-CM | POA: Diagnosis not present

## 2020-07-11 IMAGING — CR DG KNEE 1-2V*R*
2 series · 2 of 2 positions shown · non-contrast
Comparison: None.

CLINICAL DATA: Fall at home this evening landing on right side.
Right knee pain.

EXAM:
RIGHT KNEE - 1-2 VIEW

[x knee ap right (1 of 2)]
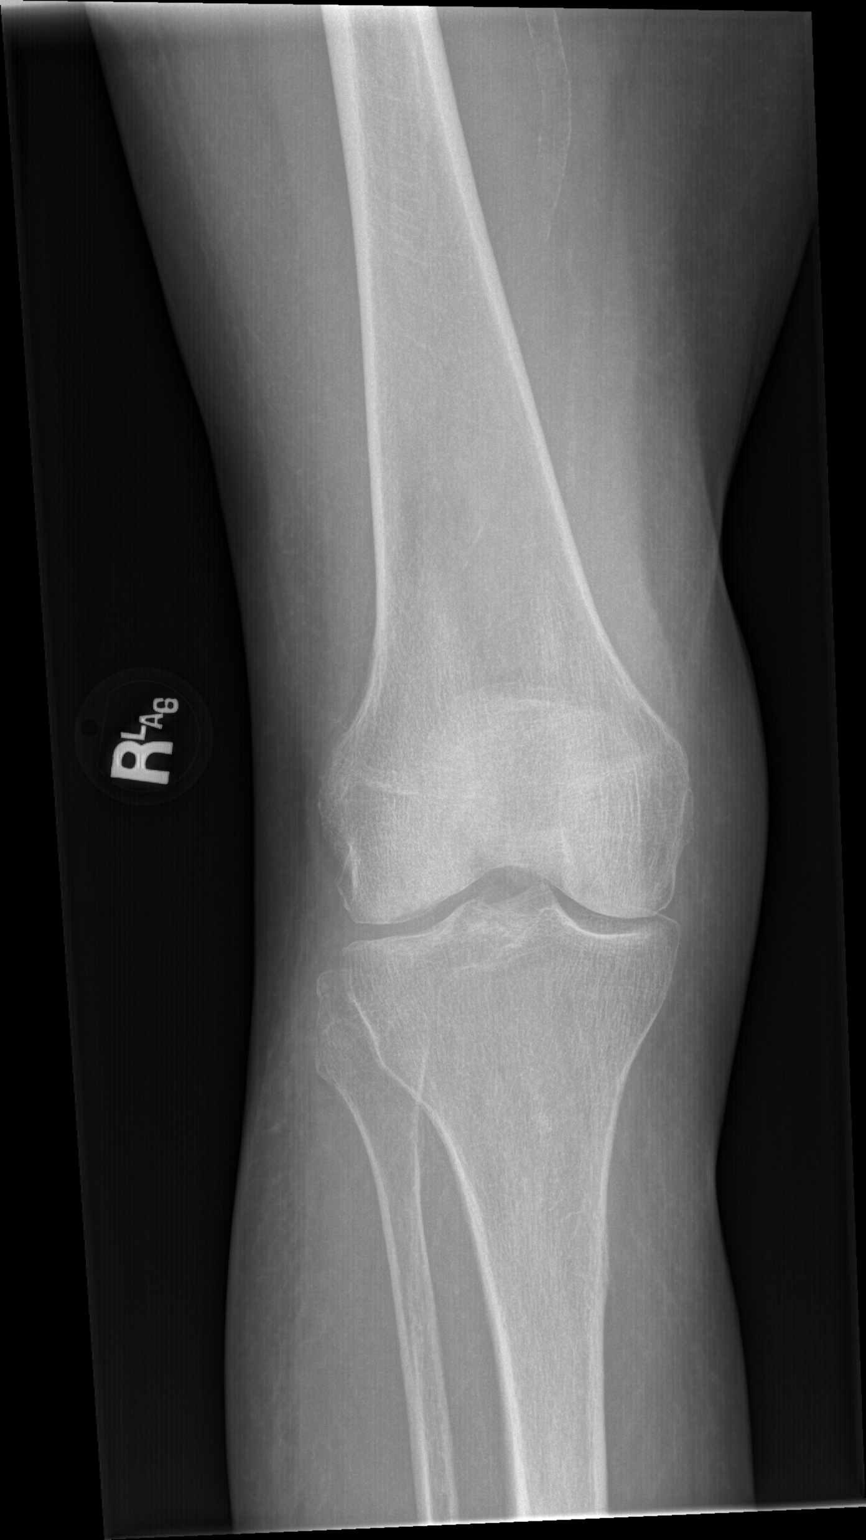

[x knee ap right (2 of 2)]
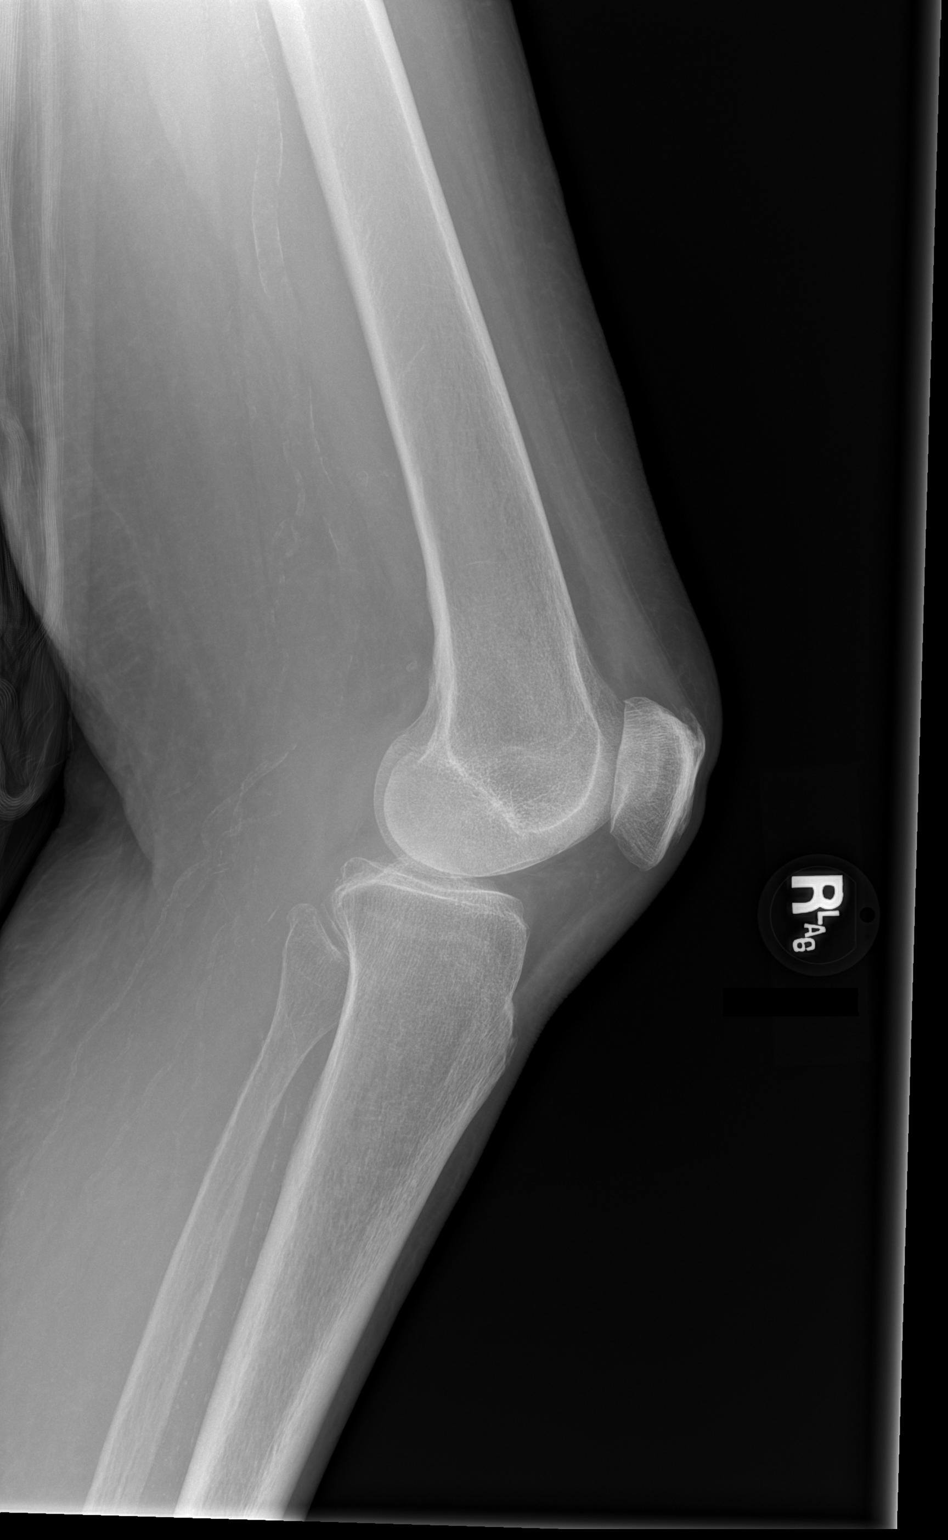

[2 of 2 positions shown; findings below may reference images not displayed]

FINDINGS: No acute fracture or dislocation. Minor osteoarthritis for age.
Trace joint effusion. There is a small quadriceps and patellar
tendon enthesophyte. Vascular calcifications.
IMPRESSION: No acute fracture or dislocation.

## 2020-07-11 IMAGING — CT CT HEAD W/O CM
3 series · 16 of 47 positions shown, 19 images · non-contrast
Comparison: None.

CLINICAL DATA: Fall

EXAM:
CT HEAD WITHOUT CONTRAST
TECHNIQUE: Contiguous axial images were obtained from the base of the skull
through the vertex without intravenous contrast.

[Series 2: head wo · axial · 0.44mm/px · z∈[+1468,+1593]mm · 10 of 31 slices shown, 13 images]
[im 3/31  brain]
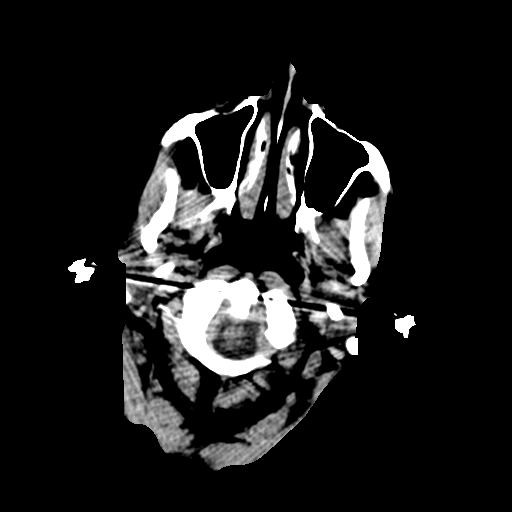
[im 3/31  bone]
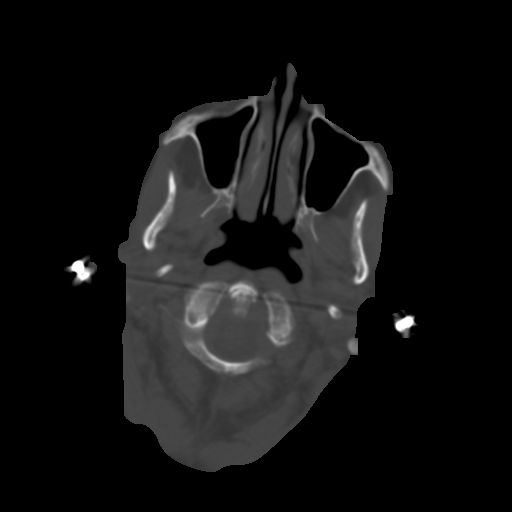
[im 6/31  brain]
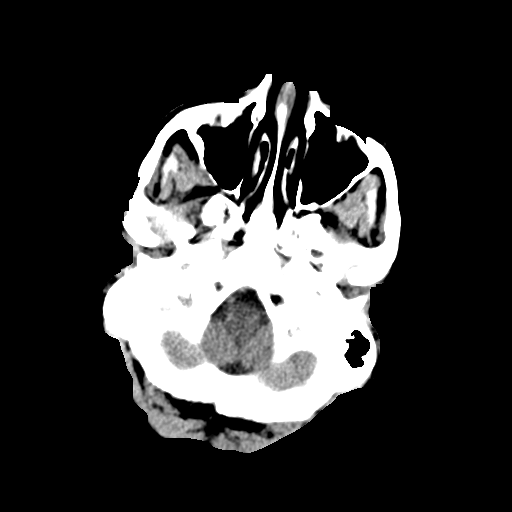
[im 9/31  brain]
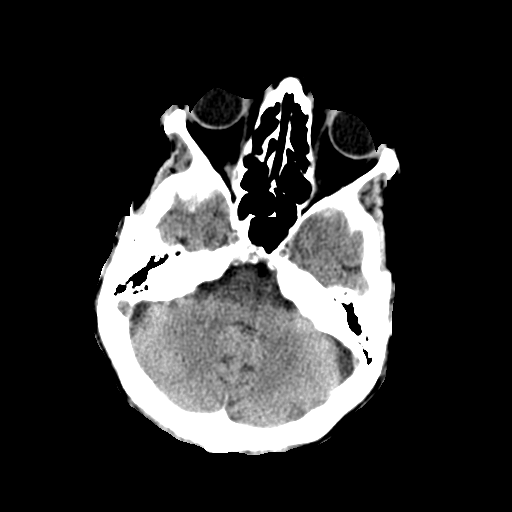
[im 11/31  brain]
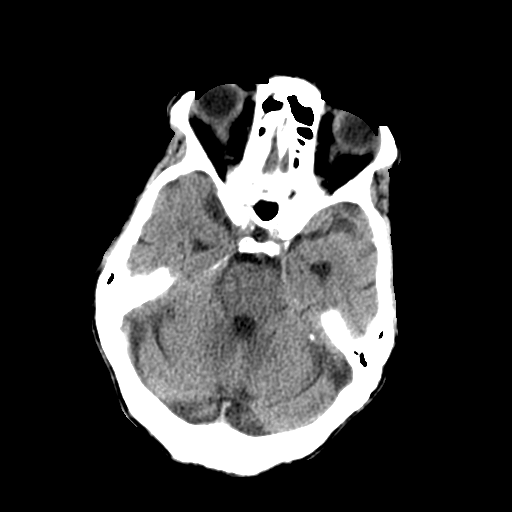
[im 14/31  brain]
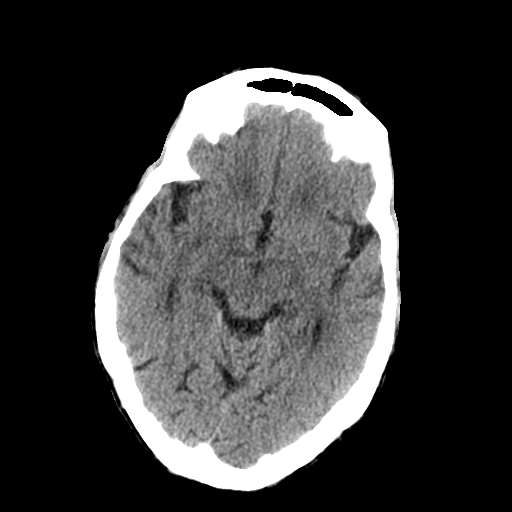
[im 14/31  bone]
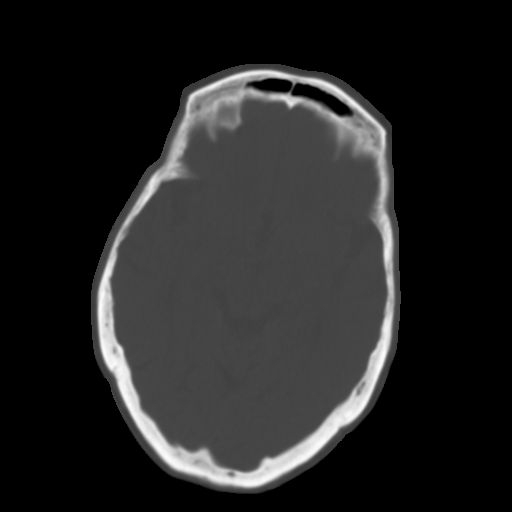
[im 17/31  brain]
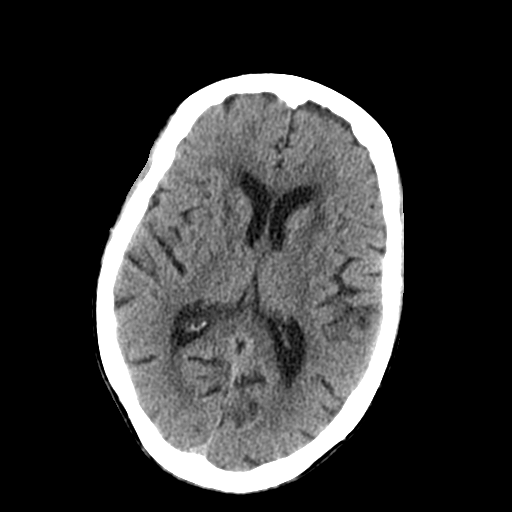
[im 20/31  brain]
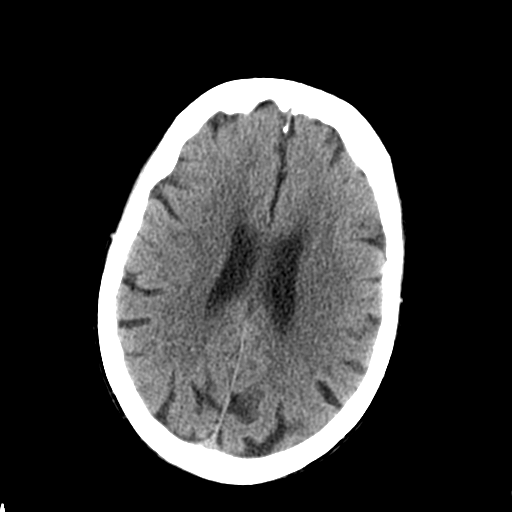
[im 23/31  brain]
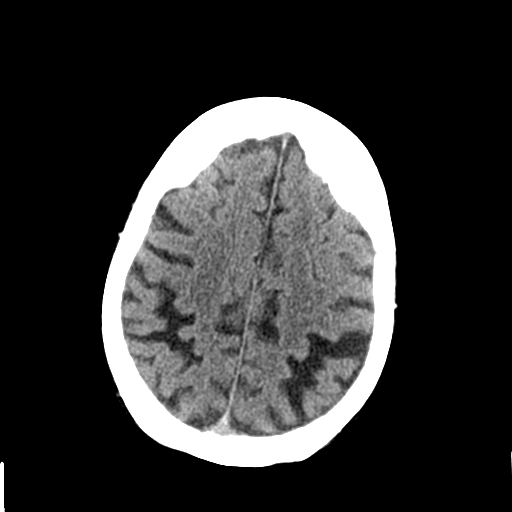
[im 25/31  brain]
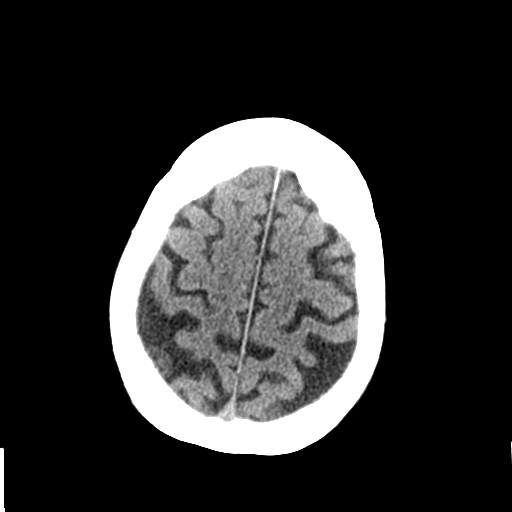
[im 25/31  bone]
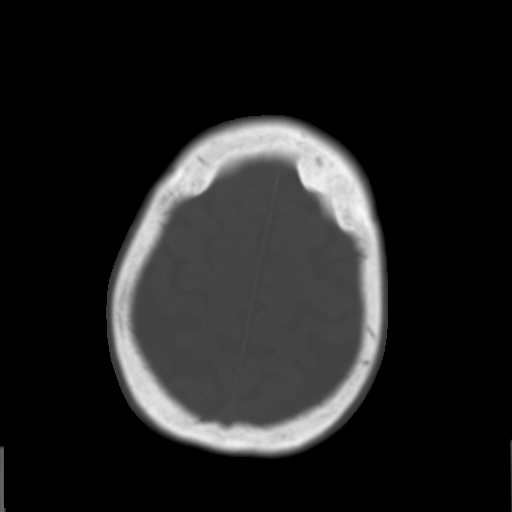
[im 28/31  brain]
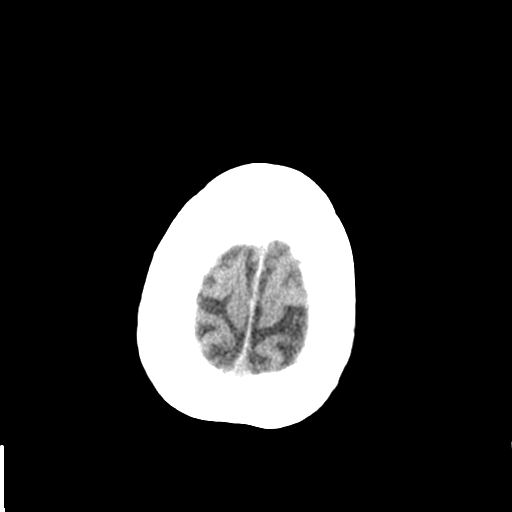

[Series 4: coronal soft tissue · coronal · 0.31mm/px · 3 of 63 slices shown]
[im 21/63  brain]
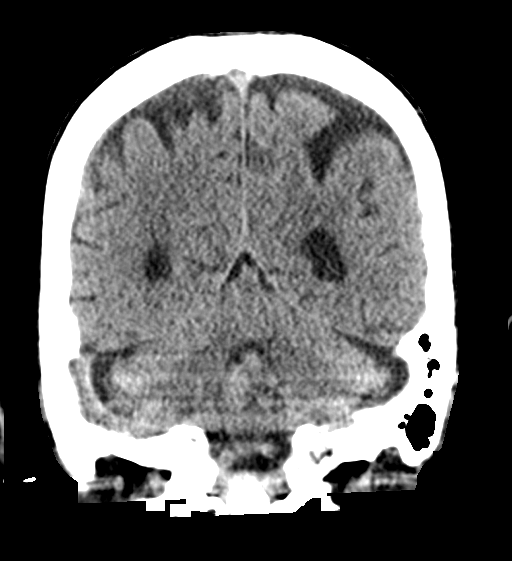
[im 28/63  brain]
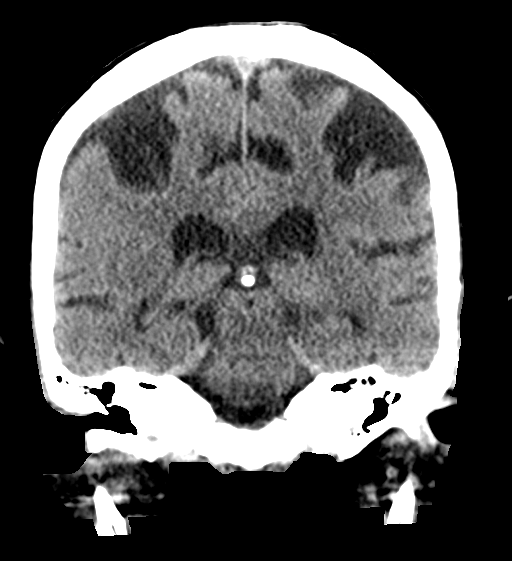
[im 35/63  brain]
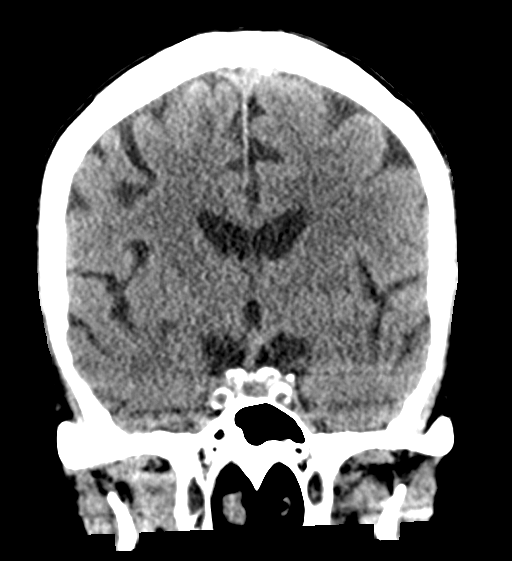

[Series 5: sagittal soft tissue · sagittal · 0.34mm/px · 3 of 51 slices shown]
[im 17/51  brain]
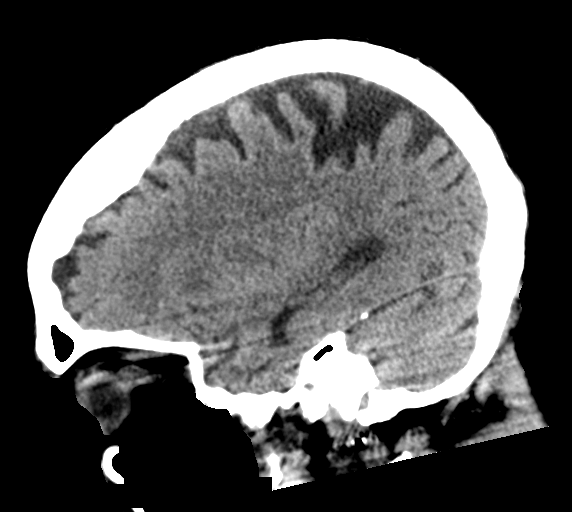
[im 26/51  brain]
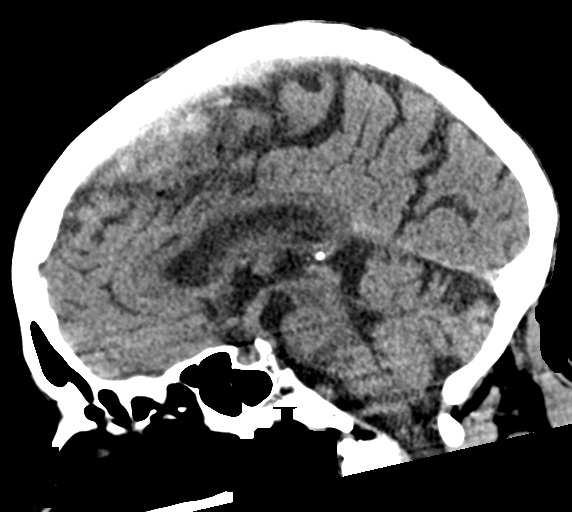
[im 34/51  brain]
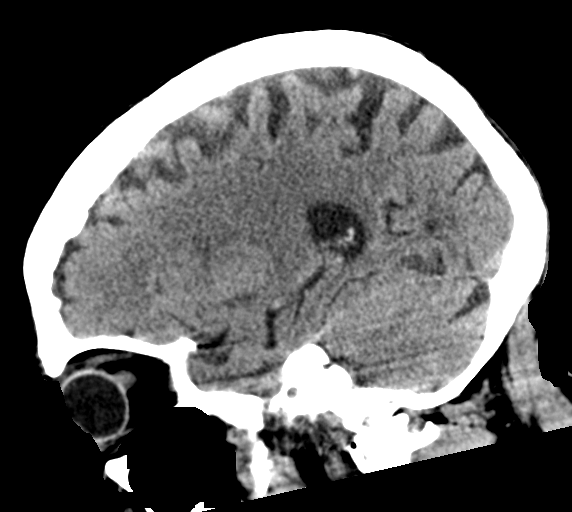

[16 of 47 positions shown; findings below may reference images not displayed]

FINDINGS: Brain: There is no mass, hemorrhage or extra-axial collection. The
size and configuration of the ventricles and extra-axial CSF spaces
are normal. There is hypoattenuation of the white matter, most
commonly indicating chronic small vessel disease.

Vascular: No abnormal hyperdensity of the major intracranial
arteries or dural venous sinuses. No intracranial atherosclerosis.

Skull: The visualized skull base, calvarium and extracranial soft
tissues are normal.

Sinuses/Orbits: No fluid levels or advanced mucosal thickening of
the visualized paranasal sinuses. No mastoid or middle ear effusion.
The orbits are normal.
IMPRESSION: Chronic small vessel disease without acute intracranial abnormality.

## 2020-07-11 IMAGING — CR DG HIP (WITH OR WITHOUT PELVIS) 2-3V*R*
3 series · 3 of 3 positions shown · non-contrast
Comparison: None.

CLINICAL DATA: Fall at home this evening landing on right side.

EXAM:
DG HIP (WITH OR WITHOUT PELVIS) 2-3V RIGHT

[x pelvis]
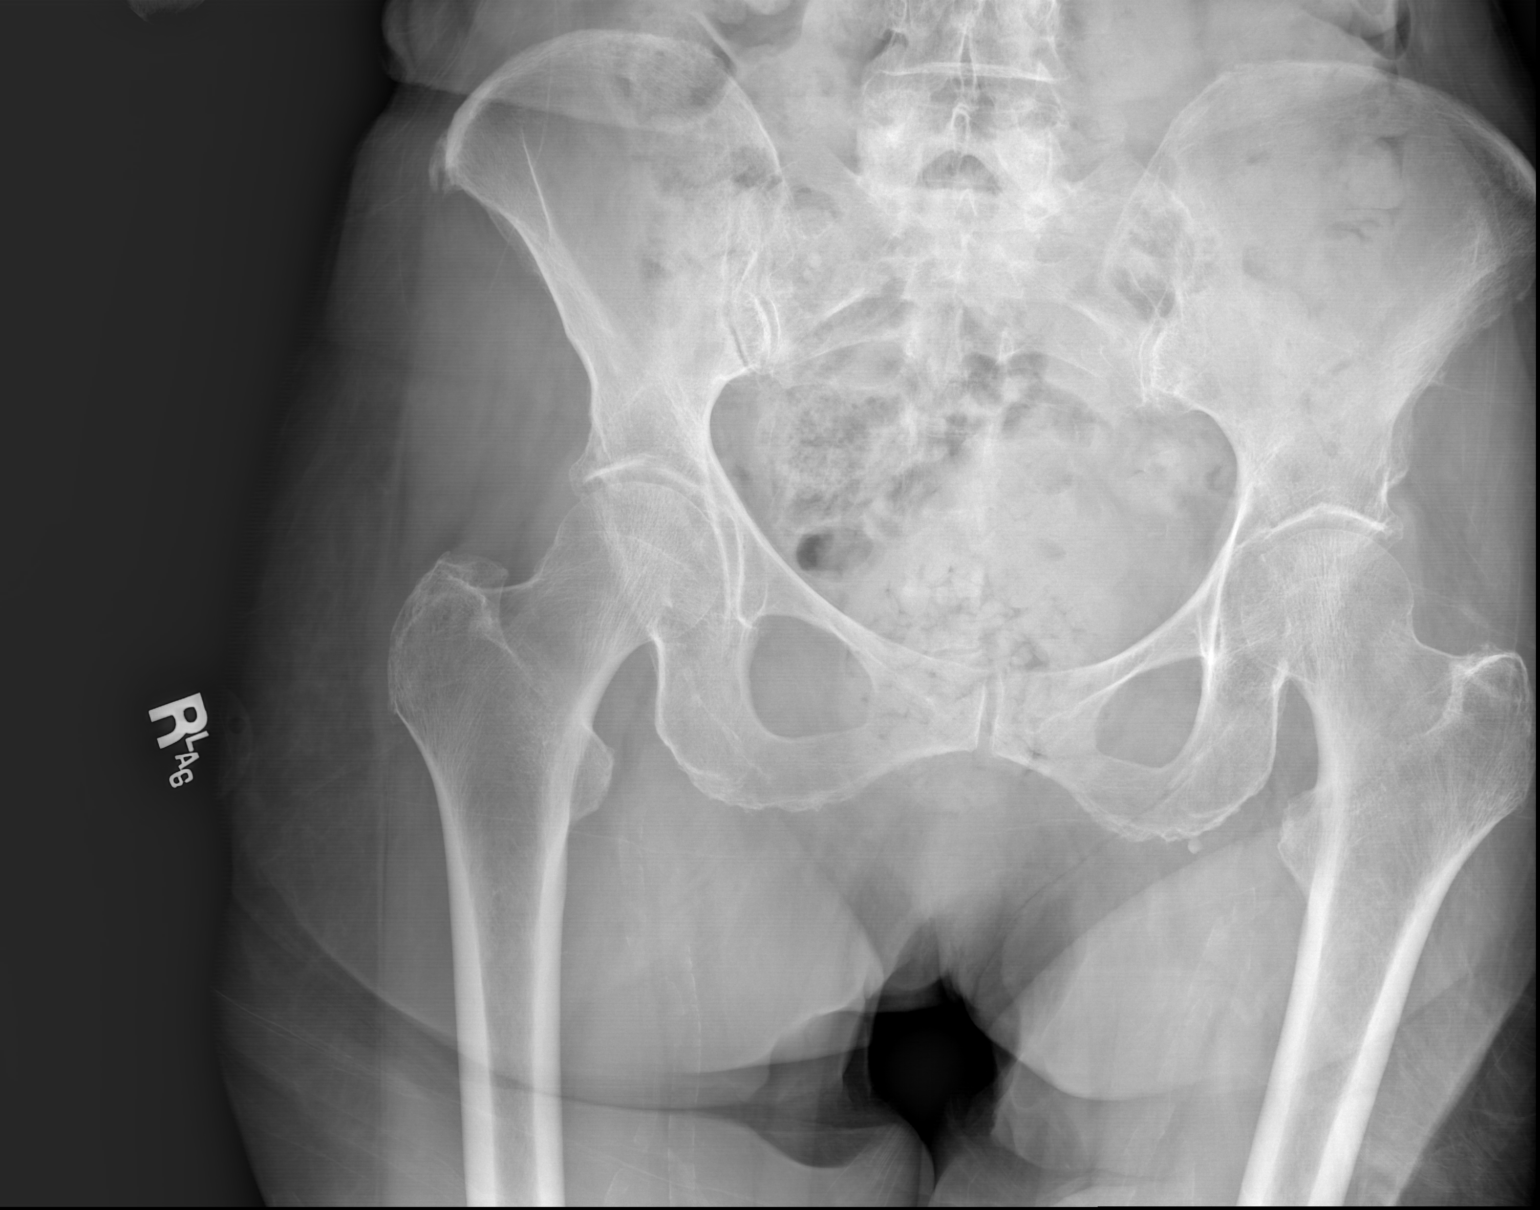

[x hip ap right]
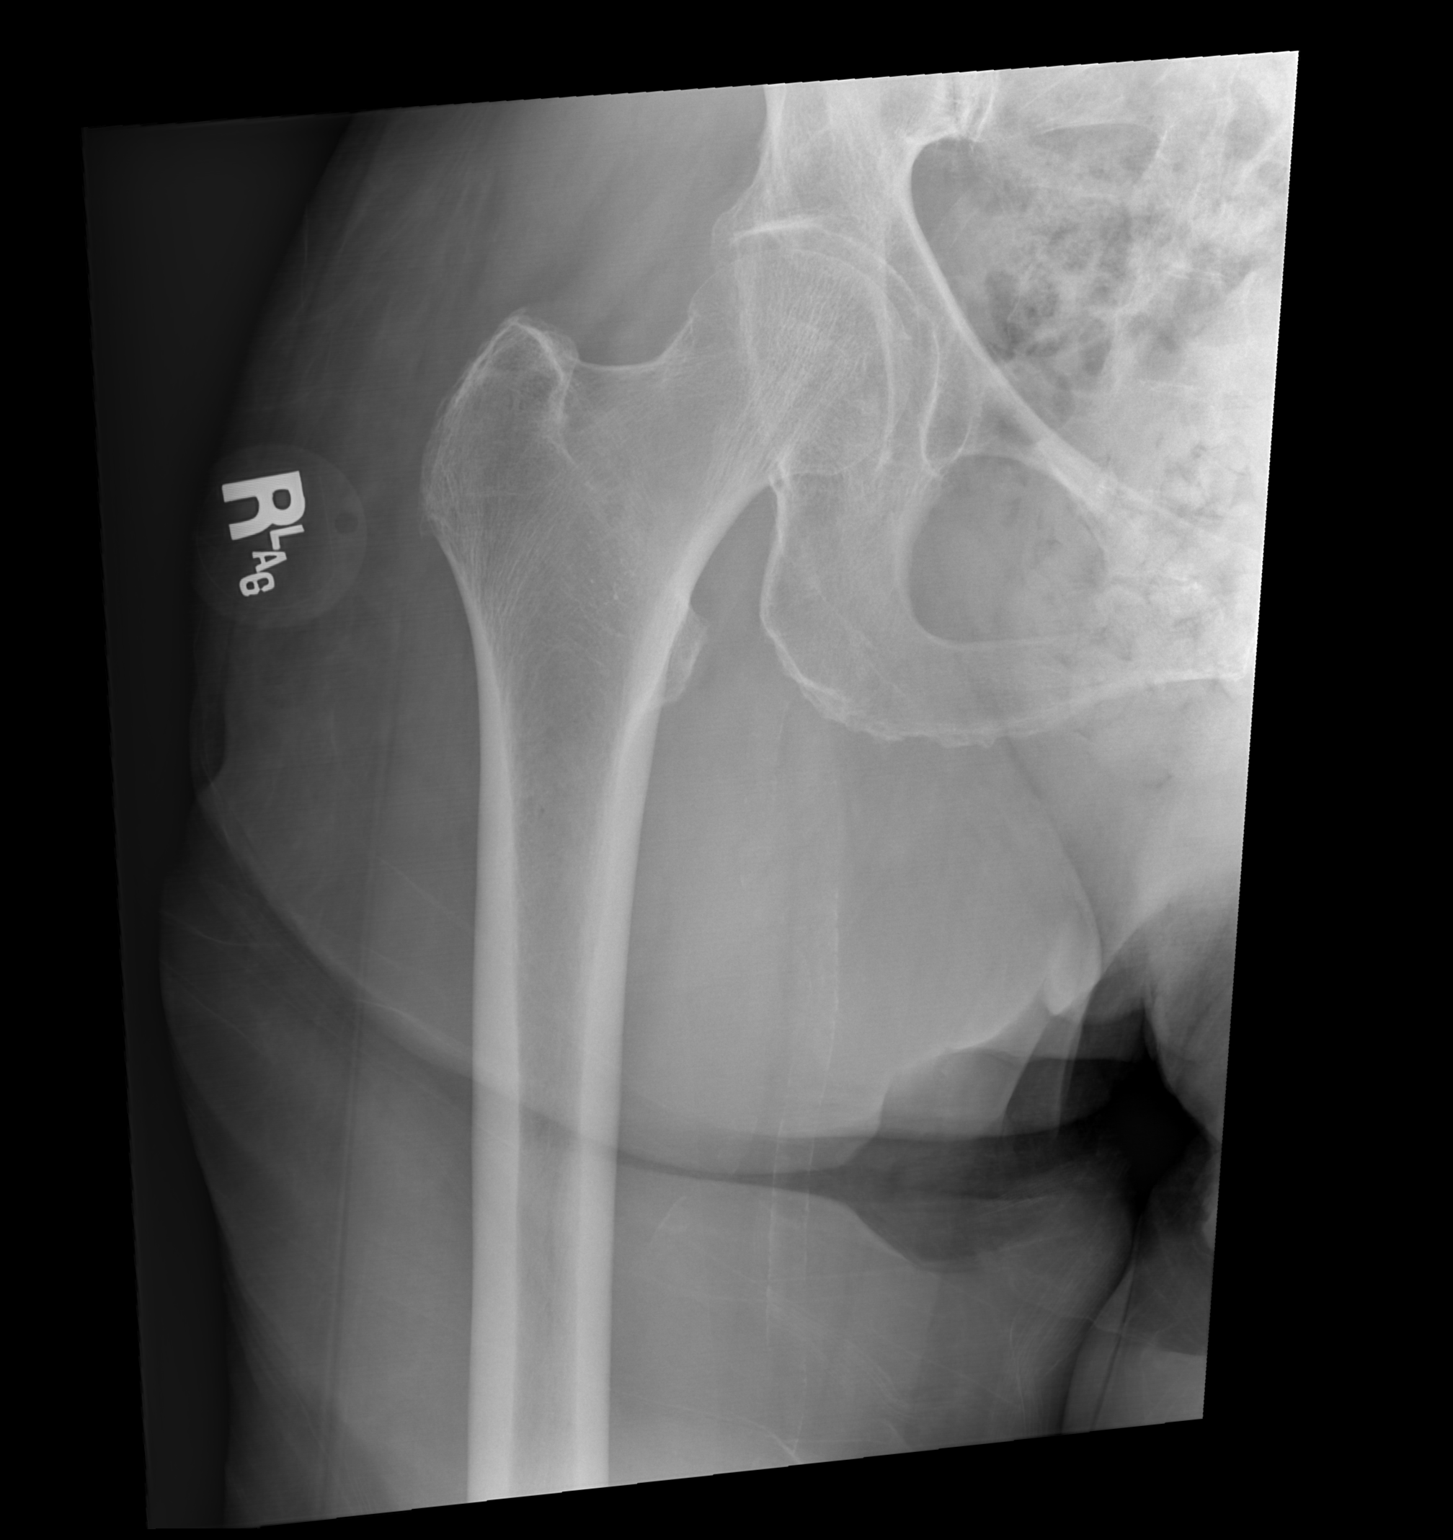

[w hip lat right]
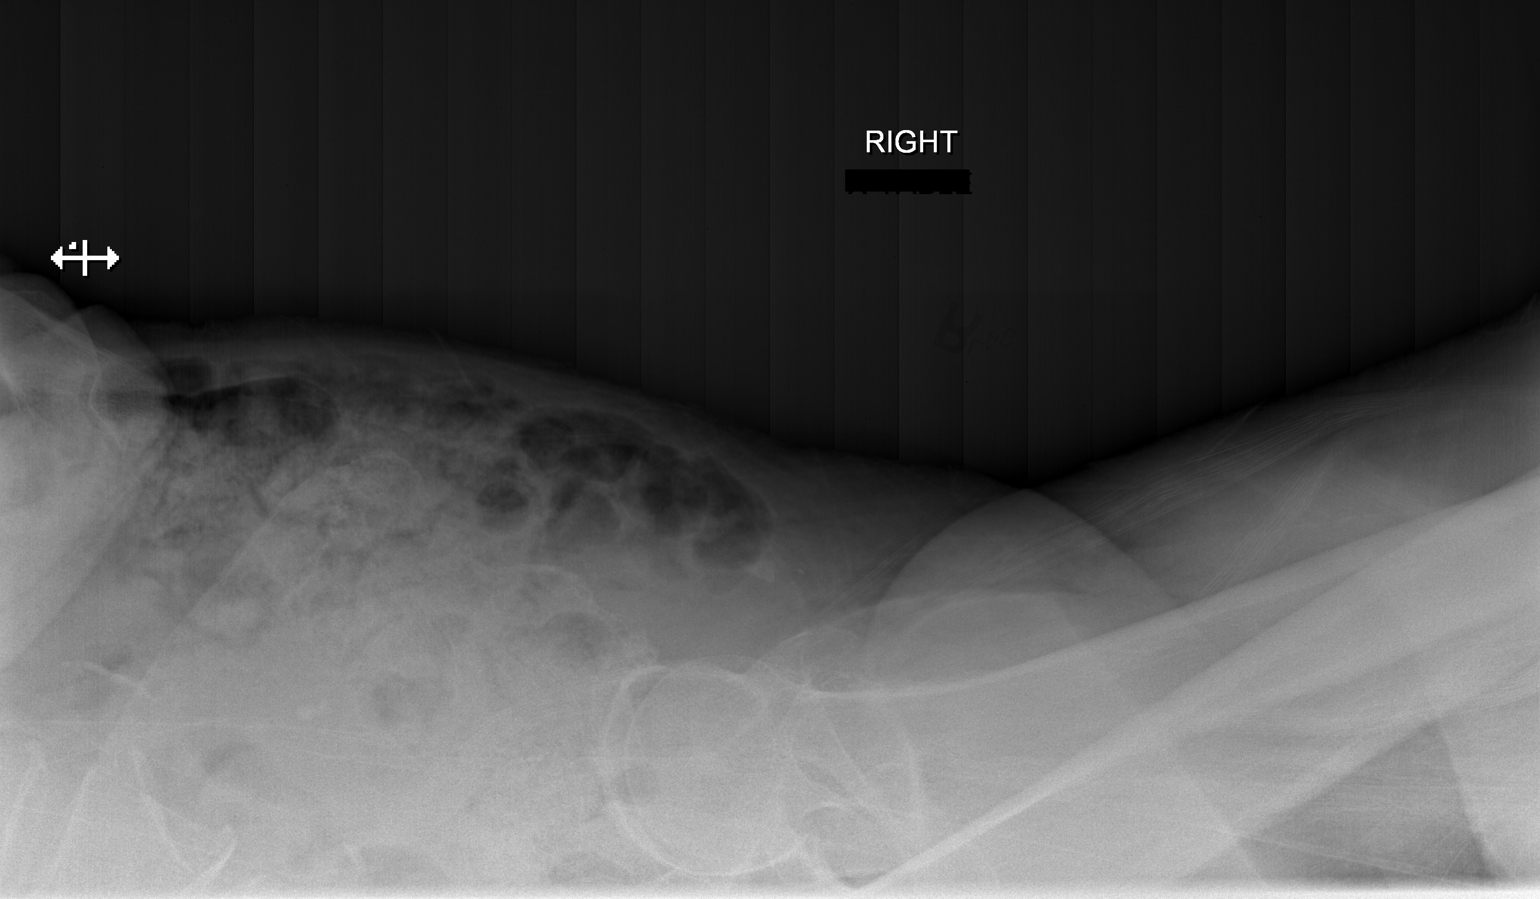

[3 of 3 positions shown; findings below may reference images not displayed]

FINDINGS: The cortical margins of the bony pelvis and right hip are intact. No
fracture. Pubic rami are intact. Pubic symphysis and sacroiliac
joints are congruent. Both femoral heads are well-seated in the
respective acetabula.
IMPRESSION: No evidence of pelvic or right hip fracture.

## 2020-07-25 DIAGNOSIS — K219 Gastro-esophageal reflux disease without esophagitis: Secondary | ICD-10-CM | POA: Diagnosis not present

## 2020-07-25 DIAGNOSIS — E7800 Pure hypercholesterolemia, unspecified: Secondary | ICD-10-CM | POA: Diagnosis not present

## 2020-07-25 DIAGNOSIS — I10 Essential (primary) hypertension: Secondary | ICD-10-CM | POA: Diagnosis not present

## 2020-07-25 DIAGNOSIS — M8580 Other specified disorders of bone density and structure, unspecified site: Secondary | ICD-10-CM | POA: Diagnosis not present

## 2020-07-26 DIAGNOSIS — U071 COVID-19: Secondary | ICD-10-CM | POA: Diagnosis not present

## 2020-07-26 DIAGNOSIS — R6510 Systemic inflammatory response syndrome (SIRS) of non-infectious origin without acute organ dysfunction: Secondary | ICD-10-CM | POA: Diagnosis not present

## 2020-07-26 DIAGNOSIS — N179 Acute kidney failure, unspecified: Secondary | ICD-10-CM | POA: Diagnosis not present

## 2020-09-20 DIAGNOSIS — D508 Other iron deficiency anemias: Secondary | ICD-10-CM | POA: Diagnosis not present

## 2020-09-20 DIAGNOSIS — K219 Gastro-esophageal reflux disease without esophagitis: Secondary | ICD-10-CM | POA: Diagnosis not present

## 2020-09-20 DIAGNOSIS — E7800 Pure hypercholesterolemia, unspecified: Secondary | ICD-10-CM | POA: Diagnosis not present

## 2020-09-20 DIAGNOSIS — M8580 Other specified disorders of bone density and structure, unspecified site: Secondary | ICD-10-CM | POA: Diagnosis not present

## 2020-09-20 DIAGNOSIS — I10 Essential (primary) hypertension: Secondary | ICD-10-CM | POA: Diagnosis not present

## 2020-10-17 DIAGNOSIS — I700 Atherosclerosis of aorta: Secondary | ICD-10-CM | POA: Diagnosis not present

## 2020-10-17 DIAGNOSIS — I10 Essential (primary) hypertension: Secondary | ICD-10-CM | POA: Diagnosis not present

## 2020-10-17 DIAGNOSIS — L603 Nail dystrophy: Secondary | ICD-10-CM | POA: Diagnosis not present

## 2020-10-17 DIAGNOSIS — N3281 Overactive bladder: Secondary | ICD-10-CM | POA: Diagnosis not present

## 2020-10-17 DIAGNOSIS — R609 Edema, unspecified: Secondary | ICD-10-CM | POA: Diagnosis not present

## 2020-10-17 DIAGNOSIS — D508 Other iron deficiency anemias: Secondary | ICD-10-CM | POA: Diagnosis not present

## 2020-10-17 DIAGNOSIS — E7800 Pure hypercholesterolemia, unspecified: Secondary | ICD-10-CM | POA: Diagnosis not present

## 2020-10-26 ENCOUNTER — Ambulatory Visit: Payer: PPO | Admitting: Podiatry

## 2020-10-26 ENCOUNTER — Other Ambulatory Visit: Payer: Self-pay

## 2020-10-26 DIAGNOSIS — B351 Tinea unguium: Secondary | ICD-10-CM

## 2020-10-26 DIAGNOSIS — M79674 Pain in right toe(s): Secondary | ICD-10-CM

## 2020-10-26 DIAGNOSIS — M79675 Pain in left toe(s): Secondary | ICD-10-CM

## 2020-10-26 NOTE — Progress Notes (Signed)
   SUBJECTIVE Patient presents to office today complaining of elongated, thickened nails that cause pain while ambulating in shoes.  Patient is unable to trim their own nails. Patient is here for further evaluation and treatment.  Past Medical History:  Diagnosis Date   Arthritis    High cholesterol    Hypertension     OBJECTIVE General Patient is awake, alert, and oriented x 3 and in no acute distress. Derm Skin is dry and supple bilateral. Negative open lesions or macerations. Remaining integument unremarkable. Nails are tender, long, thickened and dystrophic with subungual debris, consistent with onychomycosis, 1-5 bilateral. No signs of infection noted. Vasc  DP and PT pedal pulses palpable bilaterally. Temperature gradient within normal limits.  Neuro Epicritic and protective threshold sensation grossly intact bilaterally.  Musculoskeletal Exam No symptomatic pedal deformities noted bilateral. Muscular strength within normal limits.  ASSESSMENT 1.  Pain due to onychomycosis of toenails both  PLAN OF CARE 1. Patient evaluated today.  2. Instructed to maintain good pedal hygiene and foot care.  3. Mechanical debridement of nails 1-5 bilaterally performed using a nail nipper. Filed with dremel without incident.  4. Return to clinic in 3 mos.    Edrick Kins, DPM [facility]  Dr. Edrick Kins, DPM    [address], [place] [address]                Office [phone]  Fax [phone]

## 2020-11-01 DIAGNOSIS — K219 Gastro-esophageal reflux disease without esophagitis: Secondary | ICD-10-CM | POA: Diagnosis not present

## 2020-11-01 DIAGNOSIS — E7800 Pure hypercholesterolemia, unspecified: Secondary | ICD-10-CM | POA: Diagnosis not present

## 2020-11-01 DIAGNOSIS — D508 Other iron deficiency anemias: Secondary | ICD-10-CM | POA: Diagnosis not present

## 2020-11-01 DIAGNOSIS — M8580 Other specified disorders of bone density and structure, unspecified site: Secondary | ICD-10-CM | POA: Diagnosis not present

## 2020-11-01 DIAGNOSIS — I10 Essential (primary) hypertension: Secondary | ICD-10-CM | POA: Diagnosis not present

## 2020-11-17 DIAGNOSIS — R300 Dysuria: Secondary | ICD-10-CM | POA: Diagnosis not present

## 2021-01-23 DIAGNOSIS — R399 Unspecified symptoms and signs involving the genitourinary system: Secondary | ICD-10-CM | POA: Diagnosis not present

## 2021-02-07 DIAGNOSIS — R399 Unspecified symptoms and signs involving the genitourinary system: Secondary | ICD-10-CM | POA: Diagnosis not present

## 2021-04-24 DIAGNOSIS — Z0000 Encounter for general adult medical examination without abnormal findings: Secondary | ICD-10-CM | POA: Diagnosis not present

## 2021-04-24 DIAGNOSIS — I10 Essential (primary) hypertension: Secondary | ICD-10-CM | POA: Diagnosis not present

## 2021-04-24 DIAGNOSIS — Z1389 Encounter for screening for other disorder: Secondary | ICD-10-CM | POA: Diagnosis not present

## 2021-04-24 DIAGNOSIS — D0330 Melanoma in situ of unspecified part of face: Secondary | ICD-10-CM | POA: Diagnosis not present

## 2021-04-24 DIAGNOSIS — E7800 Pure hypercholesterolemia, unspecified: Secondary | ICD-10-CM | POA: Diagnosis not present

## 2021-04-24 DIAGNOSIS — N8110 Cystocele, unspecified: Secondary | ICD-10-CM | POA: Diagnosis not present

## 2021-04-24 DIAGNOSIS — M8580 Other specified disorders of bone density and structure, unspecified site: Secondary | ICD-10-CM | POA: Diagnosis not present

## 2021-07-18 DIAGNOSIS — I10 Essential (primary) hypertension: Secondary | ICD-10-CM | POA: Diagnosis not present

## 2021-07-18 DIAGNOSIS — N3000 Acute cystitis without hematuria: Secondary | ICD-10-CM | POA: Diagnosis not present

## 2021-07-18 DIAGNOSIS — R531 Weakness: Secondary | ICD-10-CM | POA: Diagnosis not present

## 2021-07-18 DIAGNOSIS — K625 Hemorrhage of anus and rectum: Secondary | ICD-10-CM | POA: Diagnosis not present

## 2021-07-18 DIAGNOSIS — R399 Unspecified symptoms and signs involving the genitourinary system: Secondary | ICD-10-CM | POA: Diagnosis not present

## 2021-10-23 DIAGNOSIS — I700 Atherosclerosis of aorta: Secondary | ICD-10-CM | POA: Diagnosis not present

## 2021-10-23 DIAGNOSIS — I10 Essential (primary) hypertension: Secondary | ICD-10-CM | POA: Diagnosis not present

## 2021-10-23 DIAGNOSIS — E7800 Pure hypercholesterolemia, unspecified: Secondary | ICD-10-CM | POA: Diagnosis not present

## 2021-10-23 DIAGNOSIS — L989 Disorder of the skin and subcutaneous tissue, unspecified: Secondary | ICD-10-CM | POA: Diagnosis not present

## 2021-10-23 DIAGNOSIS — N3281 Overactive bladder: Secondary | ICD-10-CM | POA: Diagnosis not present

## 2021-11-20 DIAGNOSIS — C44629 Squamous cell carcinoma of skin of left upper limb, including shoulder: Secondary | ICD-10-CM | POA: Diagnosis not present

## 2021-11-20 DIAGNOSIS — Z85828 Personal history of other malignant neoplasm of skin: Secondary | ICD-10-CM | POA: Diagnosis not present

## 2021-11-20 DIAGNOSIS — Z85820 Personal history of malignant melanoma of skin: Secondary | ICD-10-CM | POA: Diagnosis not present

## 2021-12-20 DIAGNOSIS — Z85820 Personal history of malignant melanoma of skin: Secondary | ICD-10-CM | POA: Diagnosis not present

## 2021-12-20 DIAGNOSIS — Z85828 Personal history of other malignant neoplasm of skin: Secondary | ICD-10-CM | POA: Diagnosis not present

## 2021-12-20 DIAGNOSIS — C44629 Squamous cell carcinoma of skin of left upper limb, including shoulder: Secondary | ICD-10-CM | POA: Diagnosis not present

## 2022-01-05 ENCOUNTER — Inpatient Hospital Stay (HOSPITAL_COMMUNITY)
Admission: EM | Admit: 2022-01-05 | Discharge: 2022-01-11 | DRG: 689 | Disposition: A | Discharge status: Skilled Nursing Facility | Payer: PPO | Attending: Internal Medicine | Admitting: Internal Medicine

## 2022-01-05 ENCOUNTER — Encounter (HOSPITAL_COMMUNITY): Payer: Self-pay

## 2022-01-05 ENCOUNTER — Emergency Department (HOSPITAL_COMMUNITY): Payer: PPO

## 2022-01-05 ENCOUNTER — Other Ambulatory Visit: Payer: Self-pay

## 2022-01-05 DIAGNOSIS — E860 Dehydration: Secondary | ICD-10-CM | POA: Diagnosis present

## 2022-01-05 DIAGNOSIS — K449 Diaphragmatic hernia without obstruction or gangrene: Secondary | ICD-10-CM | POA: Diagnosis not present

## 2022-01-05 DIAGNOSIS — D649 Anemia, unspecified: Secondary | ICD-10-CM | POA: Diagnosis present

## 2022-01-05 DIAGNOSIS — M6281 Muscle weakness (generalized): Secondary | ICD-10-CM | POA: Diagnosis not present

## 2022-01-05 DIAGNOSIS — Z79899 Other long term (current) drug therapy: Secondary | ICD-10-CM

## 2022-01-05 DIAGNOSIS — Z9189 Other specified personal risk factors, not elsewhere classified: Secondary | ICD-10-CM | POA: Diagnosis not present

## 2022-01-05 DIAGNOSIS — I10 Essential (primary) hypertension: Secondary | ICD-10-CM | POA: Diagnosis present

## 2022-01-05 DIAGNOSIS — Z7401 Bed confinement status: Secondary | ICD-10-CM | POA: Diagnosis not present

## 2022-01-05 DIAGNOSIS — R5381 Other malaise: Secondary | ICD-10-CM | POA: Diagnosis not present

## 2022-01-05 DIAGNOSIS — R059 Cough, unspecified: Secondary | ICD-10-CM | POA: Diagnosis not present

## 2022-01-05 DIAGNOSIS — N390 Urinary tract infection, site not specified: Secondary | ICD-10-CM | POA: Diagnosis present

## 2022-01-05 DIAGNOSIS — R627 Adult failure to thrive: Secondary | ICD-10-CM | POA: Diagnosis present

## 2022-01-05 DIAGNOSIS — Z602 Problems related to living alone: Secondary | ICD-10-CM | POA: Diagnosis present

## 2022-01-05 DIAGNOSIS — K219 Gastro-esophageal reflux disease without esophagitis: Secondary | ICD-10-CM | POA: Diagnosis present

## 2022-01-05 DIAGNOSIS — K5730 Diverticulosis of large intestine without perforation or abscess without bleeding: Secondary | ICD-10-CM | POA: Diagnosis not present

## 2022-01-05 DIAGNOSIS — N3289 Other specified disorders of bladder: Secondary | ICD-10-CM | POA: Diagnosis not present

## 2022-01-05 DIAGNOSIS — M1990 Unspecified osteoarthritis, unspecified site: Secondary | ICD-10-CM | POA: Diagnosis not present

## 2022-01-05 DIAGNOSIS — Z1152 Encounter for screening for COVID-19: Secondary | ICD-10-CM | POA: Diagnosis not present

## 2022-01-05 DIAGNOSIS — J9811 Atelectasis: Secondary | ICD-10-CM | POA: Diagnosis not present

## 2022-01-05 DIAGNOSIS — R2681 Unsteadiness on feet: Secondary | ICD-10-CM | POA: Diagnosis not present

## 2022-01-05 DIAGNOSIS — Z741 Need for assistance with personal care: Secondary | ICD-10-CM | POA: Diagnosis not present

## 2022-01-05 DIAGNOSIS — R2689 Other abnormalities of gait and mobility: Secondary | ICD-10-CM | POA: Diagnosis not present

## 2022-01-05 DIAGNOSIS — M6259 Muscle wasting and atrophy, not elsewhere classified, multiple sites: Secondary | ICD-10-CM | POA: Diagnosis not present

## 2022-01-05 DIAGNOSIS — Z1611 Resistance to penicillins: Secondary | ICD-10-CM | POA: Diagnosis not present

## 2022-01-05 DIAGNOSIS — R1310 Dysphagia, unspecified: Secondary | ICD-10-CM | POA: Diagnosis not present

## 2022-01-05 DIAGNOSIS — R918 Other nonspecific abnormal finding of lung field: Secondary | ICD-10-CM | POA: Diagnosis not present

## 2022-01-05 DIAGNOSIS — R531 Weakness: Secondary | ICD-10-CM | POA: Diagnosis not present

## 2022-01-05 DIAGNOSIS — Z90710 Acquired absence of both cervix and uterus: Secondary | ICD-10-CM | POA: Diagnosis not present

## 2022-01-05 DIAGNOSIS — R41841 Cognitive communication deficit: Secondary | ICD-10-CM | POA: Diagnosis not present

## 2022-01-05 DIAGNOSIS — N3000 Acute cystitis without hematuria: Principal | ICD-10-CM | POA: Diagnosis present

## 2022-01-05 DIAGNOSIS — R54 Age-related physical debility: Secondary | ICD-10-CM | POA: Diagnosis not present

## 2022-01-05 DIAGNOSIS — N179 Acute kidney failure, unspecified: Secondary | ICD-10-CM | POA: Diagnosis not present

## 2022-01-05 DIAGNOSIS — G928 Other toxic encephalopathy: Secondary | ICD-10-CM | POA: Diagnosis present

## 2022-01-05 DIAGNOSIS — R1312 Dysphagia, oropharyngeal phase: Secondary | ICD-10-CM | POA: Diagnosis not present

## 2022-01-05 DIAGNOSIS — R262 Difficulty in walking, not elsewhere classified: Secondary | ICD-10-CM | POA: Diagnosis not present

## 2022-01-05 DIAGNOSIS — B9620 Unspecified Escherichia coli [E. coli] as the cause of diseases classified elsewhere: Secondary | ICD-10-CM | POA: Diagnosis present

## 2022-01-05 DIAGNOSIS — A419 Sepsis, unspecified organism: Secondary | ICD-10-CM | POA: Diagnosis not present

## 2022-01-05 DIAGNOSIS — R000 Tachycardia, unspecified: Secondary | ICD-10-CM | POA: Diagnosis not present

## 2022-01-05 DIAGNOSIS — E7800 Pure hypercholesterolemia, unspecified: Secondary | ICD-10-CM | POA: Diagnosis present

## 2022-01-05 DIAGNOSIS — Z66 Do not resuscitate: Secondary | ICD-10-CM | POA: Diagnosis present

## 2022-01-05 DIAGNOSIS — I700 Atherosclerosis of aorta: Secondary | ICD-10-CM | POA: Diagnosis not present

## 2022-01-05 LAB — LACTIC ACID, PLASMA: Lactic Acid, Venous: 1.7 mmol/L (ref 0.5–1.9)

## 2022-01-05 LAB — CBC WITH DIFFERENTIAL/PLATELET
Abs Immature Granulocytes: 0.03 10*3/uL (ref 0.00–0.07)
Basophils Absolute: 0.1 10*3/uL (ref 0.0–0.1)
Basophils Relative: 1 %
Eosinophils Absolute: 0 10*3/uL (ref 0.0–0.5)
Eosinophils Relative: 0 %
HCT: 35.3 % — ABNORMAL LOW (ref 36.0–46.0)
Hemoglobin: 11.2 g/dL — ABNORMAL LOW (ref 12.0–15.0)
Immature Granulocytes: 0 %
Lymphocytes Relative: 3 %
Lymphs Abs: 0.3 10*3/uL — ABNORMAL LOW (ref 0.7–4.0)
MCH: 30.4 pg (ref 26.0–34.0)
MCHC: 31.7 g/dL (ref 30.0–36.0)
MCV: 95.7 fL (ref 80.0–100.0)
Monocytes Absolute: 0.4 10*3/uL (ref 0.1–1.0)
Monocytes Relative: 4 %
Neutro Abs: 9.8 10*3/uL — ABNORMAL HIGH (ref 1.7–7.7)
Neutrophils Relative %: 92 %
Platelets: 320 10*3/uL (ref 150–400)
RBC: 3.69 MIL/uL — ABNORMAL LOW (ref 3.87–5.11)
RDW: 13.1 % (ref 11.5–15.5)
WBC: 10.6 10*3/uL — ABNORMAL HIGH (ref 4.0–10.5)
nRBC: 0 % (ref 0.0–0.2)

## 2022-01-05 LAB — COMPREHENSIVE METABOLIC PANEL
ALT: 20 U/L (ref 0–44)
AST: 24 U/L (ref 15–41)
Albumin: 3.8 g/dL (ref 3.5–5.0)
Alkaline Phosphatase: 86 U/L (ref 38–126)
Anion gap: 7 (ref 5–15)
BUN: 27 mg/dL — ABNORMAL HIGH (ref 8–23)
CO2: 23 mmol/L (ref 22–32)
Calcium: 10.1 mg/dL (ref 8.9–10.3)
Chloride: 109 mmol/L (ref 98–111)
Creatinine, Ser: 1 mg/dL (ref 0.44–1.00)
GFR, Estimated: 54 mL/min — ABNORMAL LOW (ref >60)
Glucose, Bld: 118 mg/dL — ABNORMAL HIGH (ref 70–99)
Potassium: 4.5 mmol/L (ref 3.5–5.1)
Sodium: 139 mmol/L (ref 135–145)
Total Bilirubin: 0.7 mg/dL (ref 0.3–1.2)
Total Protein: 7.6 g/dL (ref 6.5–8.1)

## 2022-01-05 LAB — URINALYSIS, ROUTINE W REFLEX MICROSCOPIC
Bilirubin Urine: NEGATIVE
Glucose, UA: NEGATIVE mg/dL
Hgb urine dipstick: NEGATIVE
Ketones, ur: NEGATIVE mg/dL
Nitrite: POSITIVE — AB
Protein, ur: 30 mg/dL — AB
Specific Gravity, Urine: 1.01 (ref 1.005–1.030)
WBC, UA: >50 WBC/hpf — ABNORMAL HIGH (ref 0–5)
pH: 7 (ref 5.0–8.0)

## 2022-01-05 LAB — RESP PANEL BY RT-PCR (FLU A&B, COVID) ARPGX2
Influenza A by PCR: NEGATIVE
Influenza B by PCR: NEGATIVE
SARS Coronavirus 2 by RT PCR: NEGATIVE

## 2022-01-05 LAB — CK: Total CK: 37 U/L — ABNORMAL LOW (ref 38–234)

## 2022-01-05 MED ORDER — AMLODIPINE BESYLATE 5 MG PO TABS
2.5000 mg | ORAL_TABLET | Freq: Every day | ORAL | Status: DC
  Administered 2022-01-05 – 2022-01-07 (×3): 2.5 mg via ORAL
  Filled 2022-01-05 (×3): qty 1

## 2022-01-05 MED ORDER — ONDANSETRON HCL 4 MG/2ML IJ SOLN: 4.0000 mg | Freq: Four times a day (QID) | INTRAMUSCULAR | Status: DC | PRN

## 2022-01-05 MED ORDER — SODIUM CHLORIDE 0.9 % IV SOLN: INTRAVENOUS | Status: DC

## 2022-01-05 MED ORDER — ADULT MULTIVITAMIN W/MINERALS CH
1.0000 | ORAL_TABLET | Freq: Every day | ORAL | Status: DC
  Administered 2022-01-06 – 2022-01-11 (×6): 1 via ORAL
  Filled 2022-01-05 (×6): qty 1

## 2022-01-05 MED ORDER — METOPROLOL TARTRATE 25 MG PO TABS
50.0000 mg | ORAL_TABLET | Freq: Two times a day (BID) | ORAL | Status: DC
  Administered 2022-01-07 – 2022-01-11 (×8): 50 mg via ORAL
  Filled 2022-01-05 (×8): qty 2

## 2022-01-05 MED ORDER — SODIUM CHLORIDE 0.9 % IV SOLN
1.0000 g | INTRAVENOUS | Status: DC
  Administered 2022-01-06 – 2022-01-07 (×2): 1 g via INTRAVENOUS
  Filled 2022-01-05 (×2): qty 10

## 2022-01-05 MED ORDER — TRAMADOL HCL 50 MG PO TABS: 50.0000 mg | ORAL_TABLET | Freq: Four times a day (QID) | ORAL | Status: DC | PRN

## 2022-01-05 MED ORDER — SODIUM CHLORIDE 0.9 % IV BOLUS
500.0000 mL | Freq: Once | INTRAVENOUS | Status: AC
  Administered 2022-01-05: 500 mL via INTRAVENOUS

## 2022-01-05 MED ORDER — ACETAMINOPHEN 325 MG PO TABS
650.0000 mg | ORAL_TABLET | Freq: Four times a day (QID) | ORAL | Status: DC | PRN
  Administered 2022-01-05 – 2022-01-06 (×3): 650 mg via ORAL
  Filled 2022-01-05 (×3): qty 2

## 2022-01-05 MED ORDER — SENNA 8.6 MG PO TABS
1.0000 | ORAL_TABLET | Freq: Two times a day (BID) | ORAL | Status: DC
  Administered 2022-01-05 – 2022-01-11 (×9): 8.6 mg via ORAL
  Filled 2022-01-05 (×12): qty 1

## 2022-01-05 MED ORDER — POLYETHYLENE GLYCOL 3350 17 G PO PACK: 17.0000 g | PACK | Freq: Every day | ORAL | Status: DC | PRN

## 2022-01-05 MED ORDER — SODIUM CHLORIDE 0.9 % IV SOLN: 1.0000 g | INTRAVENOUS | Status: DC

## 2022-01-05 MED ORDER — ENOXAPARIN SODIUM 40 MG/0.4ML IJ SOSY
40.0000 mg | PREFILLED_SYRINGE | INTRAMUSCULAR | Status: DC
  Administered 2022-01-05 – 2022-01-10 (×6): 40 mg via SUBCUTANEOUS
  Filled 2022-01-05 (×6): qty 0.4

## 2022-01-05 MED ORDER — LISINOPRIL 20 MG PO TABS
20.0000 mg | ORAL_TABLET | Freq: Every day | ORAL | Status: DC
  Administered 2022-01-05 – 2022-01-06 (×2): 20 mg via ORAL
  Filled 2022-01-05 (×2): qty 1

## 2022-01-05 MED ORDER — ACETAMINOPHEN 325 MG PO TABS
650.0000 mg | ORAL_TABLET | Freq: Once | ORAL | Status: AC
  Administered 2022-01-05: 650 mg via ORAL
  Filled 2022-01-05: qty 2

## 2022-01-05 MED ORDER — IOHEXOL 300 MG/ML  SOLN
100.0000 mL | Freq: Once | INTRAMUSCULAR | Status: AC | PRN
  Administered 2022-01-05: 100 mL via INTRAVENOUS

## 2022-01-05 MED ORDER — BISACODYL 10 MG RE SUPP: 10.0000 mg | Freq: Every day | RECTAL | Status: DC | PRN

## 2022-01-05 MED ORDER — POLYVINYL ALCOHOL 1.4 % OP SOLN: Freq: Every day | OPHTHALMIC | Status: DC | PRN

## 2022-01-05 MED ORDER — TAMSULOSIN HCL 0.4 MG PO CAPS
0.4000 mg | ORAL_CAPSULE | Freq: Every day | ORAL | Status: DC
  Administered 2022-01-06 – 2022-01-11 (×6): 0.4 mg via ORAL
  Filled 2022-01-05 (×6): qty 1

## 2022-01-05 MED ORDER — PANTOPRAZOLE SODIUM 40 MG PO TBEC
40.0000 mg | DELAYED_RELEASE_TABLET | Freq: Every day | ORAL | Status: DC
  Administered 2022-01-06 – 2022-01-11 (×6): 40 mg via ORAL
  Filled 2022-01-05 (×6): qty 1

## 2022-01-05 MED ORDER — ONDANSETRON HCL 4 MG PO TABS: 4.0000 mg | ORAL_TABLET | Freq: Four times a day (QID) | ORAL | Status: DC | PRN

## 2022-01-05 MED ORDER — SODIUM CHLORIDE 0.9 % IV SOLN
1.0000 g | Freq: Once | INTRAVENOUS | Status: AC
  Administered 2022-01-05: 1 g via INTRAVENOUS
  Filled 2022-01-05: qty 10

## 2022-01-05 NOTE — H&P (Signed)
History and Physical   Sylvia Petersen WUJ:[id number] DOB: [birth year] DOA: 01/05/2022  PCP: [name], MD Patient coming from: home  Chief Complaint: severe generalized weakness / lives alone   HPI:  88 year old with a history of HTN, HLD, and OA who lives independently who was found by her home health aide today to be in the severely weakened state unable to safely ambulate or care for herself at home.  EMS was summoned and the patient was transported to the ER where she reported generalized body aches and severe generalized weakness with no other localizing symptoms.  She was COVID and influenza negative in the emergency room.  Her WBC was marginally elevated at 10.6.  BUN was elevated at 27 with a normal creatinine.  Urinalysis was grossly positive with nitrates, large leukocyte esterase, and greater than 50 WBC. At the time of my visit she is quite sedate/lethargic, but is able to answer some directed questions. She denies specific complaints, but tells me she became so weak she found she could no longer walk today.   Assessment/Plan  UTI POA Initiate empiric antibiotic therapy and follow-up urine culture  Severe deconditioned state Not presently stable to live independently therefore will require hospital admission - assess with PT/OT after treatment for infection initiated and patient adequately hydrated  Dehydration As evidenced by elevated BUN to creatinine ratio and clinical exam  Normocytic anemia Likely nutritional - no evidence of gross acute blood loss - check anemia panel in a.m.  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   Past Medical History:  Diagnosis Date   Arthritis    High cholesterol    Hypertension     Past Surgical History:  Procedure Laterality Date   ABDOMINAL HYSTERECTOMY     BLADDER SUSPENSION N/A 04/25/2016   Procedure: TRANSVAGINAL TAPE (TVT) PROCEDURE;  Surgeon: Bobbye Charleston, MD;  Location: [facility];  Service: Gynecology;  Laterality: N/A;   CARDIAC CATHETERIZATION     CYSTOCELE REPAIR N/A 04/25/2016   Procedure: ANTERIOR REPAIR (CYSTOCELE);  Surgeon: Bobbye Charleston, MD;  Location: [facility];  Service: Gynecology;  Laterality: N/A;   CYSTOSCOPY N/A 04/25/2016   Procedure: CYSTOSCOPY;  Surgeon: Bobbye Charleston, MD;  Location: [facility];  Service: Gynecology;  Laterality: N/A;   EYE SURGERY      Family History  Family History  Problem Relation Age of Onset   Cancer Sister     Social History   reports that she has never smoked. She has never used smokeless tobacco. She reports that she does not drink alcohol and does not use drugs. She lives in [place] by herself, w/ assistance from [facility].   Allergies No Known Allergies  Prior to Admission medications   Medication Sig Start Date End Date Taking? Authorizing Provider  acetaminophen (TYLENOL) 500 MG tablet Take 1,000 mg by mouth every 6 (six) hours as needed for mild pain.    [provider]  albuterol (VENTOLIN HFA) 108 (90 Base) MCG/ACT inhaler Inhale 2 puffs into the lungs once as needed for wheezing or shortness of breath (for Grade 3  or 4 hypersensitivity reaction). 02/23/20   Thurnell Lose, MD  amLODipine (NORVASC) 2.5 MG tablet Take 2.5 mg by mouth daily.    [provider]  Carboxymethylcellul-Glycerin (CLEAR EYES FOR DRY EYES OP) Apply 1 drop to eye daily as needed (for dry eyes).    [provider]  lisinopril (PRINIVIL,ZESTRIL) 20 MG tablet Take 20 mg by mouth at bedtime.     [provider]  lovastatin (MEVACOR) 20 MG tablet Take 20 mg by mouth daily at 6 PM.     [provider]  metoprolol (LOPRESSOR) 50 MG tablet Take 50 mg by mouth 2 (two) times daily.    [provider]  Multiple Vitamin (MULTIVITAMIN WITH MINERALS) TABS Take 1 tablet by mouth daily.    [provider]  pantoprazole (PROTONIX) 40 MG tablet Take 1 tablet (40 mg total) by mouth daily. 02/23/20   Thurnell Lose, MD  tamsulosin (FLOMAX) 0.4 MG CAPS capsule Take 1 capsule (0.4 mg total) by mouth daily. 02/24/20   Thurnell Lose, MD    Physical Exam: Vitals:   01/05/22 1230 01/05/22 1245 01/05/22 1300 01/05/22 1315  BP: 137/64 139/63 (!) 145/66 (!) 142/66  Pulse: (!) 103 (!) 104 (!) 102 (!) 101  Resp: '20 18 16 16  '$ Temp:      TempSrc:      SpO2: 97% 96% 96% 94%    Constitutional: NAD, sedate/lethargic Eyes: PERRL, dry eyes ENMT: Mucous membranes are dry  Respiratory: clear to auscultation bilaterally, no wheezing, no crackles. Normal respiratory effort. No accessory muscle use.  Cardiovascular: Regular rate and rhythm, no rubs / gallops. 2/6 holosystolic M which radiates into the neck. No extremity edema. 2+ pedal pulses. Abdomen: no tenderness, no masses palpated. No hepatosplenomegaly. Bowel sounds positive.  Musculoskeletal: no clubbing / cyanosis. No joint deformity upper and lower extremities.  Skin: very dry - poorly kept  Neurologic: CN 2-12 grossly intact. Sensation intact, lethargic - no focal abnormalities - moving all 4 extremities spontaneously   Labs on Admission:   CBC: Recent Labs  Lab 01/05/22 1204  WBC 10.6*  NEUTROABS 9.8*  HGB 11.2*  HCT 35.3*  MCV 95.7  PLT 027   Basic Metabolic Panel: Recent Labs  Lab 01/05/22 1204  NA 139  K 4.5  CL 109  CO2 23  GLUCOSE 118*  BUN 27*  CREATININE 1.00  CALCIUM 10.1    Liver Function Tests: Recent Labs  Lab 01/05/22 1204  AST 24  ALT 20  ALKPHOS 86  BILITOT 0.7  PROT 7.6  ALBUMIN 3.8    Urine analysis:    Component Value Date/Time   COLORURINE YELLOW 01/05/2022 1434   APPEARANCEUR CLOUDY (A) 01/05/2022 1434   LABSPEC 1.010 01/05/2022 1434   PHURINE 7.0 01/05/2022 1434   GLUCOSEU NEGATIVE 01/05/2022 1434   [place] 01/05/2022 1434   [place] 01/05/2022 1434   [place] 01/05/2022 1434   PROTEINUR 30 (A) 01/05/2022 1434   UROBILINOGEN 0.2 12/27/2011 1127   NITRITE POSITIVE (A) 01/05/2022 1434   LEUKOCYTESUR LARGE (A) 01/05/2022 1434    Recent Results (from the past 240 hour(s))  Resp Panel by RT-PCR (Flu A&B, Covid) Anterior Nasal Swab     Status: None   Collection Time: 01/05/22 11:46 AM   Specimen: Anterior Nasal Swab  Result Value Ref Range Status   SARS Coronavirus 2 by RT PCR NEGATIVE NEGATIVE Final    Comment: (NOTE) SARS-CoV-2 target nucleic acids are NOT DETECTED.  The SARS-CoV-2 RNA is generally detectable in upper respiratory specimens during the acute phase of infection. The lowest concentration of SARS-CoV-2 viral copies this assay can detect is 138 copies/mL. A negative result does not preclude SARS-Cov-2 infection and should not be used as the sole basis for treatment or other patient management decisions. A negative result may occur with  improper specimen collection/handling, submission of specimen other than nasopharyngeal swab, presence of viral mutation(s) within the areas targeted by this assay, and inadequate number of viral copies(<138 copies/mL). A negative result must be combined with clinical observations, patient history, and epidemiological information. The expected result is Negative.  Fact Sheet for Patients:  EntrepreneurPulse.com.au  Fact Sheet for Healthcare Providers:  IncredibleEmployment.be  This test is no t yet approved or cleared by the [place] FDA and  has been authorized for detection and/or diagnosis of SARS-CoV-2 by FDA under an Emergency Use  Authorization (EUA). This EUA will remain  in effect (meaning this test can be used) for the duration of the COVID-19 declaration under Section 564(b)(1) of the Act, 21 U.S.C.section 360bbb-3(b)(1), unless the authorization is terminated  or revoked sooner.       Influenza A by PCR NEGATIVE NEGATIVE Final   Influenza B by PCR NEGATIVE NEGATIVE Final    Comment: (NOTE) The Xpert Xpress SARS-CoV-2/FLU/RSV plus assay is intended as an aid in the diagnosis of influenza from Nasopharyngeal swab specimens and should not be used as a sole basis for treatment. Nasal washings and aspirates are unacceptable for Xpert Xpress SARS-CoV-2/FLU/RSV testing.  Fact Sheet for Patients: EntrepreneurPulse.com.au  Fact Sheet for Healthcare Providers: IncredibleEmployment.be  This test is not yet approved or cleared by the [place] FDA and has been authorized for detection and/or diagnosis of SARS-CoV-2 by FDA under an Emergency Use Authorization (EUA). This EUA will remain in effect (meaning this test can be used) for the duration of the COVID-19 declaration under Section 564(b)(1) of the Act, 21 U.S.C. section 360bbb-3(b)(1), unless the authorization is terminated or revoked.  Performed at [facility], [place] [address] [place], [place] [address]      Radiological Exams on Admission: CT CHEST ABDOMEN PELVIS W CONTRAST  Result Date: 01/05/2022 CLINICAL DATA:  Sepsis EXAM: CT CHEST, ABDOMEN, AND PELVIS WITH CONTRAST TECHNIQUE: Multidetector CT imaging of the chest, abdomen and pelvis was performed following the standard protocol during bolus administration of intravenous contrast. RADIATION DOSE REDUCTION: This exam was performed according to the departmental dose-optimization program which includes automated exposure control, adjustment of the mA and/or kV according to patient size and/or use of iterative reconstruction technique. CONTRAST:   137m OMNIPAQUE IOHEXOL 300 MG/ML  SOLN COMPARISON:  Chest CT 02/18/2018 FINDINGS: CT CHEST FINDINGS Cardiovascular: Normal cardiac size.No pericardial disease.Normal size main and branch pulmonary arteries.Moderate atherosclerosis of thoracic aorta. Aortic valve calcifications. Mediastinum/Nodes: No lymphadenopathy.The thyroid is unremarkable.Esophagus is unremarkable.The trachea is unremarkable. Lungs/Pleura: Bibasilar atelectasis. Scattered small nodules unchanged since December 2018 and likely benign. No new suspicious pulmonary nodules. No other airspace consolidation. No pleural effusion. No pneumothorax. Musculoskeletal: Multilevel degenerative changes of the spine.No acute osseous abnormality. No suspicious osseous lesion. CT ABDOMEN PELVIS FINDINGS Hepatobiliary: Mild hepatic steatosis. The gallbladder is unremarkable. Pancreas: Mildly atrophic. No ductal dilation or peripancreatic inflammatory change. Spleen: Normal in size without focal abnormality. Adrenals/Urinary Tract: Mild left adrenal thickening without discrete nodule. Normal right adrenal gland. No hydronephrosis or nephroureterolithiasis. Extrarenal pelvis with mild pelvic fullness. The bladder is mild bladder section with circumferential bladder wall thickening.  Stomach/Bowel: Tiny hiatal hernia. The stomach is within normal limits. There is no evidence of bowel obstruction.The appendix is normal. Colonic diverticulosis. No acute diverticulitis. Vascular/Lymphatic: Aortoiliac atherosclerosis. No AAA. No lymphadenopathy. Reproductive: Prior hysterectomy. Other: Tiny fat containing umbilical hernia. No ascites. No free air. Musculoskeletal: No acute osseous abnormality. No suspicious osseous lesion. Multilevel degenerative changes of the spine. Mild bilateral hip osteoarthritis. IMPRESSION: Circumferential bladder wall thickening, can be seen in cystitis. Recommend correlation with urinalysis. No other acute findings in the abdomen or pelvis. No  other acute findings in the chest. Bibasilar subsegmental atelectasis in the lung bases. Additional chronic findings as described above. Electronically Signed   By: Maurine Simmering M.D.   On: 01/05/2022 15:36   DG Chest Portable 1 View  Result Date: 01/05/2022 CLINICAL DATA:  Cough.  Weakness.  Flu like symptoms. EXAM: PORTABLE CHEST 1 VIEW COMPARISON:  02/13/2020 FINDINGS: The patient's chin and facial tissues obscure the medial lung apices. Atherosclerotic calcification of the aortic arch. Borderline cardiomegaly. Visualized lungs appear clear. Bony demineralization. Thoracic spondylosis. IMPRESSION: 1. No acute findings. 2. Borderline cardiomegaly. 3. Thoracic spondylosis. 4. Bony demineralization. 5. Atherosclerotic calcification of the aortic arch. Electronically Signed   By: Van Clines M.D.   On: 01/05/2022 11:40    Cherene Altes, MD [facility] Office  [phone] Pager - Text Page per Amion as per below:  On-Call/Text Page:      [email or web address]  If 7PM-7AM, please contact night-coverage [email or web address] 01/05/2022, 3:39 PM

## 2022-01-05 NOTE — ED Notes (Signed)
Pt has a purewick on

## 2022-01-05 NOTE — ED Triage Notes (Signed)
Pt BIB EMS from home, c/o flu-like symptoms and weakness x 2 weeks ago. Per EMS generalized weakness and copious amounts of nasal secretion. Lives alone but receives home health x 3 days/week. Skin cancer spot removed earlier in the week.   BP 150/78 P 100 spO2 97% CBG 131

## 2022-01-05 NOTE — ED Provider Notes (Signed)
[facility] DEPT Provider Note   CSN: [id number] Arrival date & time: 01/05/22  1045     History  Chief Complaint  Patient presents with   Weakness    Sylvia Petersen is a 88 y.o. female.  Pt is a 88 yo female with a pmhx significant for htn, high cholesterol, and arthritis.  Pt said she was been weak for the past 2 weeks.  She has had a lot of nasal drainage.  She lives alone, but does have home health.  Pt is vague when describing her sx.        Home Medications Prior to Admission medications   Medication Sig Start Date End Date Taking? Authorizing Provider  acetaminophen (TYLENOL) 500 MG tablet Take 1,000 mg by mouth every 6 (six) hours as needed for mild pain.    [provider]  albuterol (VENTOLIN HFA) 108 (90 Base) MCG/ACT inhaler Inhale 2 puffs into the lungs once as needed for wheezing or shortness of breath (for Grade 3 or 4 hypersensitivity reaction). 02/23/20   Thurnell Lose, MD  amLODipine (NORVASC) 2.5 MG tablet Take 2.5 mg by mouth daily.    [provider]  Carboxymethylcellul-Glycerin (CLEAR EYES FOR DRY EYES OP) Apply 1 drop to eye daily as needed (for dry eyes).    [provider]  lisinopril (PRINIVIL,ZESTRIL) 20 MG tablet Take 20 mg by mouth at bedtime.     [provider]  lovastatin (MEVACOR) 20 MG tablet Take 20 mg by mouth daily at 6 PM.     [provider]  metoprolol (LOPRESSOR) 50 MG tablet Take 50 mg by mouth 2 (two) times daily.    [provider]  Multiple Vitamin (MULTIVITAMIN WITH MINERALS) TABS Take 1 tablet by mouth daily.    [provider]  pantoprazole (PROTONIX) 40 MG tablet Take 1 tablet (40 mg total) by mouth daily. 02/23/20   Thurnell Lose, MD  tamsulosin (FLOMAX) 0.4 MG CAPS capsule Take 1 capsule (0.4 mg total) by mouth daily. 02/24/20   Thurnell Lose, MD      Allergies    Patient has no known allergies.    Review of Systems    Review of Systems  HENT:  Positive for rhinorrhea.   Neurological:  Positive for weakness.  All other systems reviewed and are negative.   Physical Exam Updated Vital Signs BP (!) 142/66   Pulse (!) 101   Temp 98.3 F (36.8 C) (Oral)   Resp 16   SpO2 94%  Physical Exam Vitals and nursing note reviewed.  Constitutional:      Appearance: Normal appearance.  HENT:     Head: Normocephalic and atraumatic.     Right Ear: External ear normal.     Left Ear: External ear normal.     Nose: Nose normal.     Mouth/Throat:     Mouth: Mucous membranes are dry.  Eyes:     Extraocular Movements: Extraocular movements intact.     Conjunctiva/sclera: Conjunctivae normal.     Pupils: Pupils are equal, round, and reactive to light.  Cardiovascular:     Rate and Rhythm: Regular rhythm. Tachycardia present.     Pulses: Normal pulses.     Heart sounds: Normal heart sounds.  Pulmonary:     Effort: Pulmonary effort is normal.     Breath sounds: Normal breath sounds.  Abdominal:     General: Abdomen is flat. Bowel sounds are normal.  Palpations: Abdomen is soft.  Musculoskeletal:        General: Normal range of motion.     Cervical back: Normal range of motion and neck supple.  Skin:    General: Skin is warm.     Capillary Refill: Capillary refill takes less than 2 seconds.  Neurological:     General: No focal deficit present.     Mental Status: She is alert and oriented to person, place, and time.  Psychiatric:        Mood and Affect: Mood normal.        Behavior: Behavior normal.     ED Results / Procedures / Treatments   Labs (all labs ordered are listed, but only abnormal results are displayed) Labs Reviewed  CBC WITH DIFFERENTIAL/PLATELET - Abnormal; Notable for the following components:      Result Value   WBC 10.6 (*)    RBC 3.69 (*)    Hemoglobin 11.2 (*)    HCT 35.3 (*)    Neutro Abs 9.8 (*)    Lymphs Abs 0.3 (*)    All other components within normal limits   COMPREHENSIVE METABOLIC PANEL - Abnormal; Notable for the following components:   Glucose, Bld 118 (*)    BUN 27 (*)    GFR, Estimated 54 (*)    All other components within normal limits  URINALYSIS, ROUTINE W REFLEX MICROSCOPIC - Abnormal; Notable for the following components:   APPearance CLOUDY (*)    Protein, ur 30 (*)    Nitrite POSITIVE (*)    Leukocytes,Ua LARGE (*)    WBC, UA >50 (*)    Bacteria, UA RARE (*)    All other components within normal limits  RESP PANEL BY RT-PCR (FLU A&B, COVID) ARPGX2  CULTURE, BLOOD (ROUTINE X 2)  CULTURE, BLOOD (ROUTINE X 2)  URINE CULTURE  LACTIC ACID, PLASMA  CK  CBG MONITORING, ED    EKG EKG Interpretation  Date/Time:  Friday January 05 2022 11:41:38 EDT Ventricular Rate:  101 PR Interval:  180 QRS Duration: 93 QT Interval:  360 QTC Calculation: 467 R Axis:   34 Text Interpretation: Sinus tachycardia Artifact in lead(s) I III aVR aVL aVF V1 V2 Since last tracing rate faster Confirmed by Isla Pence [phone]) on 01/05/2022 3:37:54 PM  Radiology CT CHEST ABDOMEN PELVIS W CONTRAST  Result Date: 01/05/2022 CLINICAL DATA:  Sepsis EXAM: CT CHEST, ABDOMEN, AND PELVIS WITH CONTRAST TECHNIQUE: Multidetector CT imaging of the chest, abdomen and pelvis was performed following the standard protocol during bolus administration of intravenous contrast. RADIATION DOSE REDUCTION: This exam was performed according to the departmental dose-optimization program which includes automated exposure control, adjustment of the mA and/or kV according to patient size and/or use of iterative reconstruction technique. CONTRAST:  136m OMNIPAQUE IOHEXOL 300 MG/ML  SOLN COMPARISON:  Chest CT 02/18/2018 FINDINGS: CT CHEST FINDINGS Cardiovascular: Normal cardiac size.No pericardial disease.Normal size main and branch pulmonary arteries.Moderate atherosclerosis of thoracic aorta. Aortic valve calcifications. Mediastinum/Nodes: No lymphadenopathy.The thyroid is  unremarkable.Esophagus is unremarkable.The trachea is unremarkable. Lungs/Pleura: Bibasilar atelectasis. Scattered small nodules unchanged since December 2018 and likely benign. No new suspicious pulmonary nodules. No other airspace consolidation. No pleural effusion. No pneumothorax. Musculoskeletal: Multilevel degenerative changes of the spine.No acute osseous abnormality. No suspicious osseous lesion. CT ABDOMEN PELVIS FINDINGS Hepatobiliary: Mild hepatic steatosis. The gallbladder is unremarkable. Pancreas: Mildly atrophic. No ductal dilation or peripancreatic inflammatory change. Spleen: Normal in size without focal abnormality. Adrenals/Urinary Tract: Mild left adrenal thickening without  discrete nodule. Normal right adrenal gland. No hydronephrosis or nephroureterolithiasis. Extrarenal pelvis with mild pelvic fullness. The bladder is mild bladder section with circumferential bladder wall thickening. Stomach/Bowel: Tiny hiatal hernia. The stomach is within normal limits. There is no evidence of bowel obstruction.The appendix is normal. Colonic diverticulosis. No acute diverticulitis. Vascular/Lymphatic: Aortoiliac atherosclerosis. No AAA. No lymphadenopathy. Reproductive: Prior hysterectomy. Other: Tiny fat containing umbilical hernia. No ascites. No free air. Musculoskeletal: No acute osseous abnormality. No suspicious osseous lesion. Multilevel degenerative changes of the spine. Mild bilateral hip osteoarthritis. IMPRESSION: Circumferential bladder wall thickening, can be seen in cystitis. Recommend correlation with urinalysis. No other acute findings in the abdomen or pelvis. No other acute findings in the chest. Bibasilar subsegmental atelectasis in the lung bases. Additional chronic findings as described above. Electronically Signed   By: Maurine Simmering M.D.   On: 01/05/2022 15:36   DG Chest Portable 1 View  Result Date: 01/05/2022 CLINICAL DATA:  Cough.  Weakness.  Flu like symptoms. EXAM: PORTABLE  CHEST 1 VIEW COMPARISON:  02/13/2020 FINDINGS: The patient's chin and facial tissues obscure the medial lung apices. Atherosclerotic calcification of the aortic arch. Borderline cardiomegaly. Visualized lungs appear clear. Bony demineralization. Thoracic spondylosis. IMPRESSION: 1. No acute findings. 2. Borderline cardiomegaly. 3. Thoracic spondylosis. 4. Bony demineralization. 5. Atherosclerotic calcification of the aortic arch. Electronically Signed   By: Van Clines M.D.   On: 01/05/2022 11:40    Procedures Procedures    Medications Ordered in ED Medications  sodium chloride 0.9 % bolus 500 mL (has no administration in time range)  cefTRIAXone (ROCEPHIN) 1 g in sodium chloride 0.9 % 100 mL IVPB (has no administration in time range)  sodium chloride 0.9 % bolus 500 mL (500 mLs Intravenous New Bag/Given 01/05/22 1207)  acetaminophen (TYLENOL) tablet 650 mg (650 mg Oral Given 01/05/22 1435)  iohexol (OMNIPAQUE) 300 MG/ML solution 100 mL (100 mLs Intravenous Contrast Given 01/05/22 1447)    ED Course/ Medical Decision Making/ A&P                           Medical Decision Making Amount and/or Complexity of Data Reviewed Labs: ordered. Radiology: ordered.  Risk OTC drugs. Prescription drug management. Decision regarding hospitalization.   This patient presents to the ED for concern of weakness, this involves an extensive number of treatment options, and is a complaint that carries with it a high risk of complications and morbidity.  The differential diagnosis includes infection, electrolyte abn   Co morbidities that complicate the patient evaluation  htn, high cholesterol, and arthritis   Additional history obtained:  Additional history obtained from epic chart review    Lab Tests:  I Ordered, and personally interpreted labs.  The pertinent results include:  covid/flu neg; cmp nl; cbc with hgb 10.6/hct 11.2 (chronic); lactic nl at 1.7; ua + for uti   Imaging  Studies ordered:  I ordered imaging studies including cxr  I independently visualized and interpreted imaging which showed  IMPRESSION:  1. No acute findings.  2. Borderline cardiomegaly.  3. Thoracic spondylosis.  4. Bony demineralization.  5. Atherosclerotic calcification of the aortic arch.   I agree with the radiologist interpretation   Cardiac Monitoring:  The patient was maintained on a cardiac monitor.  I personally viewed and interpreted the cardiac monitored which showed an underlying rhythm of: nsr   Medicines ordered and prescription drug management:  I ordered medication including ivfs  for dehydration  Reevaluation  of the patient after these medicines showed that the patient improved I have reviewed the patients home medicines and have made adjustments as needed   Test Considered:  ct   Critical Interventions:  abx   Consultations Obtained:  I requested consultation with the hospitalist (Dr. Thereasa Solo),  and discussed lab and imaging findings as well as pertinent plan - the will admit   Problem List / ED Course:  UTI with FTT   Reevaluation:  After the interventions noted above, I reevaluated the patient and found that they have :improved   Social Determinants of Health:  Lives at home   Dispostion:  After consideration of the diagnostic results and the patients response to treatment, I feel that the patent would benefit from admission.    CRITICAL CARE Performed by: Isla Pence   Total critical care time: 30 minutes  Critical care time was exclusive of separately billable procedures and treating other patients.  Critical care was necessary to treat or prevent imminent or life-threatening deterioration.  Critical care was time spent personally by me on the following activities: development of treatment plan with patient and/or surrogate as well as nursing, discussions with consultants, evaluation of patient's response to treatment,  examination of patient, obtaining history from patient or surrogate, ordering and performing treatments and interventions, ordering and review of laboratory studies, ordering and review of radiographic studies, pulse oximetry and re-evaluation of patient's condition.         Final Clinical Impression(s) / ED Diagnoses Final diagnoses:  Acute cystitis without hematuria  Weakness  Dehydration  Failure to thrive in adult    Rx / DC Orders ED Discharge Orders     None         Isla Pence, MD 01/05/22 1539

## 2022-01-06 DIAGNOSIS — K219 Gastro-esophageal reflux disease without esophagitis: Secondary | ICD-10-CM | POA: Diagnosis present

## 2022-01-06 DIAGNOSIS — Z90710 Acquired absence of both cervix and uterus: Secondary | ICD-10-CM | POA: Diagnosis not present

## 2022-01-06 DIAGNOSIS — M1990 Unspecified osteoarthritis, unspecified site: Secondary | ICD-10-CM | POA: Diagnosis present

## 2022-01-06 DIAGNOSIS — Z1152 Encounter for screening for COVID-19: Secondary | ICD-10-CM | POA: Diagnosis not present

## 2022-01-06 DIAGNOSIS — D649 Anemia, unspecified: Secondary | ICD-10-CM | POA: Diagnosis present

## 2022-01-06 DIAGNOSIS — R54 Age-related physical debility: Secondary | ICD-10-CM | POA: Diagnosis present

## 2022-01-06 DIAGNOSIS — B9620 Unspecified Escherichia coli [E. coli] as the cause of diseases classified elsewhere: Secondary | ICD-10-CM | POA: Diagnosis present

## 2022-01-06 DIAGNOSIS — Z602 Problems related to living alone: Secondary | ICD-10-CM | POA: Diagnosis present

## 2022-01-06 DIAGNOSIS — Z79899 Other long term (current) drug therapy: Secondary | ICD-10-CM | POA: Diagnosis not present

## 2022-01-06 DIAGNOSIS — N3000 Acute cystitis without hematuria: Secondary | ICD-10-CM | POA: Diagnosis present

## 2022-01-06 DIAGNOSIS — E7800 Pure hypercholesterolemia, unspecified: Secondary | ICD-10-CM | POA: Diagnosis present

## 2022-01-06 DIAGNOSIS — G928 Other toxic encephalopathy: Secondary | ICD-10-CM | POA: Diagnosis present

## 2022-01-06 DIAGNOSIS — E860 Dehydration: Secondary | ICD-10-CM | POA: Diagnosis present

## 2022-01-06 DIAGNOSIS — R262 Difficulty in walking, not elsewhere classified: Secondary | ICD-10-CM | POA: Diagnosis present

## 2022-01-06 DIAGNOSIS — Z1611 Resistance to penicillins: Secondary | ICD-10-CM | POA: Diagnosis present

## 2022-01-06 DIAGNOSIS — R531 Weakness: Secondary | ICD-10-CM | POA: Diagnosis not present

## 2022-01-06 DIAGNOSIS — I10 Essential (primary) hypertension: Secondary | ICD-10-CM | POA: Diagnosis present

## 2022-01-06 DIAGNOSIS — R627 Adult failure to thrive: Secondary | ICD-10-CM | POA: Diagnosis present

## 2022-01-06 DIAGNOSIS — N179 Acute kidney failure, unspecified: Secondary | ICD-10-CM | POA: Diagnosis present

## 2022-01-06 DIAGNOSIS — Z66 Do not resuscitate: Secondary | ICD-10-CM | POA: Diagnosis present

## 2022-01-06 LAB — RETICULOCYTES
Immature Retic Fract: 11.4 % (ref 2.3–15.9)
RBC.: 3.54 MIL/uL — ABNORMAL LOW (ref 3.87–5.11)
Retic Count, Absolute: 63 10*3/uL (ref 19.0–186.0)
Retic Ct Pct: 1.8 % (ref 0.4–3.1)

## 2022-01-06 LAB — CBC
HCT: 32.9 % — ABNORMAL LOW (ref 36.0–46.0)
Hemoglobin: 10.4 g/dL — ABNORMAL LOW (ref 12.0–15.0)
MCH: 29.7 pg (ref 26.0–34.0)
MCHC: 31.6 g/dL (ref 30.0–36.0)
MCV: 94 fL (ref 80.0–100.0)
Platelets: 266 10*3/uL (ref 150–400)
RBC: 3.5 MIL/uL — ABNORMAL LOW (ref 3.87–5.11)
RDW: 13.2 % (ref 11.5–15.5)
WBC: 8.5 10*3/uL (ref 4.0–10.5)
nRBC: 0 % (ref 0.0–0.2)

## 2022-01-06 LAB — IRON AND TIBC
Iron: 21 ug/dL — ABNORMAL LOW (ref 28–170)
Saturation Ratios: 10 % — ABNORMAL LOW (ref 10.4–31.8)
TIBC: 217 ug/dL — ABNORMAL LOW (ref 250–450)
UIBC: 196 ug/dL

## 2022-01-06 LAB — COMPREHENSIVE METABOLIC PANEL
ALT: 23 U/L (ref 0–44)
AST: 42 U/L — ABNORMAL HIGH (ref 15–41)
Albumin: 3.1 g/dL — ABNORMAL LOW (ref 3.5–5.0)
Alkaline Phosphatase: 63 U/L (ref 38–126)
Anion gap: 7 (ref 5–15)
BUN: 24 mg/dL — ABNORMAL HIGH (ref 8–23)
CO2: 21 mmol/L — ABNORMAL LOW (ref 22–32)
Calcium: 9 mg/dL (ref 8.9–10.3)
Chloride: 114 mmol/L — ABNORMAL HIGH (ref 98–111)
Creatinine, Ser: 1.05 mg/dL — ABNORMAL HIGH (ref 0.44–1.00)
GFR, Estimated: 50 mL/min — ABNORMAL LOW (ref >60)
Glucose, Bld: 139 mg/dL — ABNORMAL HIGH (ref 70–99)
Potassium: 4 mmol/L (ref 3.5–5.1)
Sodium: 142 mmol/L (ref 135–145)
Total Bilirubin: 0.5 mg/dL (ref 0.3–1.2)
Total Protein: 6.1 g/dL — ABNORMAL LOW (ref 6.5–8.1)

## 2022-01-06 LAB — FERRITIN: Ferritin: 238 ng/mL (ref 11–307)

## 2022-01-06 LAB — VITAMIN B12: Vitamin B-12: 567 pg/mL (ref 180–914)

## 2022-01-06 LAB — FOLATE: Folate: 21.4 ng/mL (ref >5.9)

## 2022-01-06 MED ORDER — SODIUM CHLORIDE 0.45 % IV SOLN: INTRAVENOUS | Status: DC

## 2022-01-06 NOTE — Evaluation (Signed)
Physical Therapy Evaluation Patient Details Name: Sylvia Petersen MRN: [id number] DOB: [birth year] Today's Date: 01/06/2022  History of Present Illness  88 year old admitted with  generalized body aches,  severe generalized weakness. COVID and influenza negative. admitted with UTI.   PMH: HTN, HLD, and OA   .  Clinical Impression  Pt admitted with above diagnosis.  Pt requiring +2 assist mod/max for bed mobility and Sit<>stand transfers at time of PT eval.  Pt reports she has caregivers at home, does not have 24/7 care. Pt will need SNF placement unless family can arrange 24 hour assist.   Per RN pt is not agreeable to SNF however the family wants her to go to rehab. Will continue efforts to progress pt in acute setting.   Pt currently with functional limitations due to the deficits listed below (see PT Problem List). Pt will benefit from skilled PT to increase their independence and safety with mobility to allow discharge to the venue listed below.          Recommendations for follow up therapy are one component of a multi-disciplinary discharge planning process, led by the attending physician.  Recommendations may be updated based on patient status, additional functional criteria and insurance authorization.  Follow Up Recommendations Skilled nursing-short term rehab (<3 hours/day) Can patient physically be transported by private vehicle: No    Assistance Recommended at Discharge Frequent or constant Supervision/Assistance  Patient can return home with the following  Two people to help with walking and/or transfers;Two people to help with bathing/dressing/bathroom;Help with stairs or ramp for entrance;Assist for transportation;Assistance with cooking/housework    Equipment Recommendations None recommended by PT  Recommendations for Other Services       Functional Status Assessment Patient has had a recent decline in their functional status and demonstrates the ability to make  significant improvements in function in a reasonable and predictable amount of time.     Precautions / Restrictions Precautions Precautions: Fall Restrictions Weight Bearing Restrictions: No      Mobility  Bed Mobility Overal bed mobility: Needs Assistance Bed Mobility: Supine to Sit, Sit to Supine     Supine to sit: Mod assist, +2 for physical assistance, +2 for safety/equipment Sit to supine: Mod assist, Max assist, +2 for physical assistance, +2 for safety/equipment   General bed mobility comments: assist to progress LEs off bed and to elevate trunk, assist to control trunk descent and lift LEs on to bed    Transfers Overall transfer level: Needs assistance Equipment used: Rolling walker (2 wheels) Transfers: Sit to/from Stand Sit to Stand: +2 physical assistance, +2 safety/equipment           General transfer comment: assist with anterior-superior wt shift, difficulty coming to full stand, trunk and hips flexed    Ambulation/Gait               General Gait Details: unable  Stairs            Wheelchair Mobility    Modified Rankin (Stroke Patients Only)       Balance Overall balance assessment: Needs assistance Sitting-balance support: Feet supported, Bilateral upper extremity supported Sitting balance-Leahy Scale: Poor Sitting balance - Comments: with incr time pt able to briefly maintain midline with clsoe supervision Postural control: Posterior lean Standing balance support: Reliant on assistive device for balance, Bilateral upper extremity supported, During functional activity Standing balance-Leahy Scale: Zero Standing balance comment: reliant on device and external assist  Pertinent Vitals/Pain Pain Assessment Pain Assessment: Faces Faces Pain Scale: Hurts little more Pain Location: pt does not specify, grimaces with movement; states "neck hurts" later in session Pain Descriptors / Indicators:  Grimacing, Sore Pain Intervention(s): Monitored during session    Home Living Family/patient expects to be discharged to:: Unsure Living Arrangements: Alone                 Additional Comments: pt has caregivers from 8-12 and 5-9 each day. she is alone in the intermim    Prior Function Prior Level of Function : Needs assist             Mobility Comments: amb with rollator, household amb at baseline       Hand Dominance        Extremity/Trunk Assessment   Upper Extremity Assessment Upper Extremity Assessment: Defer to OT evaluation    Lower Extremity Assessment Lower Extremity Assessment: Generalized weakness    Cervical / Trunk Assessment Cervical / Trunk Assessment: Kyphotic  Communication   Communication: HOH  Cognition Arousal/Alertness: Awake/alert Behavior During Therapy: Flat affect   Area of Impairment: Following commands                       Following Commands: Follows one step commands with increased time, Follows multi-step commands inconsistently                General Comments      Exercises     Assessment/Plan    PT Assessment Patient needs continued PT services  PT Problem List Decreased strength;Decreased mobility;Decreased activity tolerance;Decreased balance;Decreased knowledge of use of DME       PT Treatment Interventions DME instruction;Therapeutic exercise;Gait training;Functional mobility training;Therapeutic activities;Patient/family education;Balance training    PT Goals (Current goals can be found in the Care Plan section)  Acute Rehab PT Goals Patient Stated Goal: to go home (per RN family wants rehab) PT Goal Formulation: With patient Time For Goal Achievement: 01/19/22 Potential to Achieve Goals: Fair    Frequency Min 2X/week     Co-evaluation               AM-PAC PT "6 Clicks" Mobility  Outcome Measure Help needed turning from your back to your side while in a flat bed without using  bedrails?: Total Help needed moving from lying on your back to sitting on the side of a flat bed without using bedrails?: Total Help needed moving to and from a bed to a chair (including a wheelchair)?: Total Help needed standing up from a chair using your arms (e.g., wheelchair or bedside chair)?: Total Help needed to walk in hospital room?: Total Help needed climbing 3-5 steps with a railing? : Total 6 Click Score: 6    End of Session Equipment Utilized During Treatment: Gait belt Activity Tolerance: Patient tolerated treatment well Patient left: with call bell/phone within reach;in bed;with bed alarm set Nurse Communication: Mobility status PT Visit Diagnosis: Other abnormalities of gait and mobility (R26.89);Difficulty in walking, not elsewhere classified (R26.2);Muscle weakness (generalized) (M62.81)    Time: 6073-7106 PT Time Calculation (min) (ACUTE ONLY): 14 min   Charges:   PT Evaluation $PT Eval Low Complexity: [place], PT  Acute Rehab Dept [facility]) [phone]  [facility] Weekend Pager [facility] only)  [phone]  01/06/2022   [facility] 01/06/2022, 4:58 PM

## 2022-01-06 NOTE — Progress Notes (Signed)
Sylvia Petersen  [facility]MC:[id number] DOB: May 05, 1929 DOA: 01/05/2022 PCP: [name], MD    Brief Narrative:  88 year old with a history of HTN, HLD, and OA who lives independently who was found by her home health aide today to be in the severely weakened state unable to safely ambulate or care for herself at home.  EMS was summoned and the patient was transported to the ER where she reported generalized body aches and severe generalized weakness with no other localizing symptoms.  She was COVID and influenza negative in the emergency room.  Her WBC was marginally elevated at 10.6.  BUN was elevated at 27 with a normal creatinine.  Urinalysis was grossly positive with nitrates, large leukocyte esterase, and greater than 50 WBC. At the time of my visit she is quite sedate/lethargic, but is able to answer some directed questions. She denies specific complaints, but tells me she became so weak she found she could no longer walk today.   Consultants:  None  Goals of Care:  Code Status: Full Code   DVT prophylaxis: Lovenox  Interim Hx: No acute events recorded overnight.  Afebrile.  Vital signs stable.  Tachycardia has resolved and blood pressure has improved.  Much more alert at the time of my visit but not yet back to baseline.  Denies any focal complaints.  Reports feeling weak in general.  Admits to having multiple urinary tract infections recently.  Denies shortness of breath or chest pain.  Assessment & Plan:  UTI POA Continue empiric antibiotic therapy and follow culture data    Severe deconditioned state Initiate PT/OT evaluations -patient may not be able to live independently going forward but we will need to see how she does with hydration and therapy   Dehydration Continue volume resuscitation but gently slow rate today   Normocytic anemia Iron studies consistent with anemia of poor nutritional state - no evidence of gross acute blood loss -hemoglobin has declined slightly  consistent with hydration   HTN Blood pressure stable   HLD Hold usual home medical therapy until intake more consistent   Holosystolic M auscultation is suggestive of AoS - is hemodynamically stable - re-examine after hydration    Disposition: Was living independently prior to this admission -awaiting PT/OT evaluations to determine best venue for discharge   Objective: Blood pressure 123/62, pulse 87, temperature 99.9 F (37.7 C), temperature source Oral, resp. rate 16, height '5\' 2"'$  (1.575 m), SpO2 97 %.  Intake/Output Summary (Last 24 hours) at 01/06/2022 0847 Last data filed at 01/06/2022 0800 Gross per 24 hour  Intake 2160.75 ml  Output 850 ml  Net 1310.75 ml   There were no vitals filed for this visit.  Examination: General: No acute respiratory distress Lungs: Clear to auscultation bilaterally without wheezes or crackles Cardiovascular: Regular rate and rhythm w/ less prominent but persistent holosystolic murmur on exam today Abdomen: Nontender, nondistended, soft, bowel sounds positive, no rebound, no ascites, no appreciable mass Extremities: No significant cyanosis, clubbing, or edema bilateral lower extremities  CBC: Recent Labs  Lab 01/05/22 1204 01/06/22 0504  WBC 10.6* 8.5  NEUTROABS 9.8*  --   HGB 11.2* 10.4*  HCT 35.3* 32.9*  MCV 95.7 94.0  PLT 320 662   Basic Metabolic Panel: Recent Labs  Lab 01/05/22 1204 01/06/22 0504  NA 139 142  K 4.5 4.0  CL 109 114*  CO2 23 21*  GLUCOSE 118* 139*  BUN 27* 24*  CREATININE 1.00 1.05*  CALCIUM 10.1 9.0  GFR: CrCl cannot be calculated (Unknown ideal weight.).   Scheduled Meds:  amLODipine  2.5 mg Oral Daily   enoxaparin (LOVENOX) injection  40 mg Subcutaneous Q24H   lisinopril  20 mg Oral QHS   metoprolol tartrate  50 mg Oral BID   multivitamin with minerals  1 tablet Oral Daily   pantoprazole  40 mg Oral Daily   senna  1 tablet Oral BID   tamsulosin  0.4 mg Oral Daily   Continuous  Infusions:  sodium chloride 100 mL/hr at 01/06/22 0343   cefTRIAXone (ROCEPHIN)  IV       LOS: 0 days   Cherene Altes, MD [facility] Office  [phone] Pager - Text Page per Shea Evans  If 7PM-7AM, please contact night-coverage per Amion 01/06/2022, 8:47 AM

## 2022-01-07 DIAGNOSIS — N3000 Acute cystitis without hematuria: Secondary | ICD-10-CM | POA: Diagnosis not present

## 2022-01-07 LAB — URINE CULTURE: Culture: >=100000 — AB

## 2022-01-07 LAB — BASIC METABOLIC PANEL
Anion gap: 4 — ABNORMAL LOW (ref 5–15)
BUN: 32 mg/dL — ABNORMAL HIGH (ref 8–23)
CO2: 21 mmol/L — ABNORMAL LOW (ref 22–32)
Calcium: 8.6 mg/dL — ABNORMAL LOW (ref 8.9–10.3)
Chloride: 114 mmol/L — ABNORMAL HIGH (ref 98–111)
Creatinine, Ser: 1.28 mg/dL — ABNORMAL HIGH (ref 0.44–1.00)
GFR, Estimated: 40 mL/min — ABNORMAL LOW (ref >60)
Glucose, Bld: 102 mg/dL — ABNORMAL HIGH (ref 70–99)
Potassium: 3.5 mmol/L (ref 3.5–5.1)
Sodium: 139 mmol/L (ref 135–145)

## 2022-01-07 MED ORDER — PRAVASTATIN SODIUM 20 MG PO TABS
20.0000 mg | ORAL_TABLET | Freq: Every day | ORAL | Status: DC
  Administered 2022-01-07 – 2022-01-10 (×4): 20 mg via ORAL
  Filled 2022-01-07 (×4): qty 1

## 2022-01-07 MED ORDER — APOAEQUORIN 20 MG PO CAPS: 40.0000 mg | ORAL_CAPSULE | Freq: Every day | ORAL | Status: DC

## 2022-01-07 MED ORDER — FESOTERODINE FUMARATE ER 4 MG PO TB24
4.0000 mg | ORAL_TABLET | Freq: Every day | ORAL | Status: DC
  Administered 2022-01-07 – 2022-01-11 (×5): 4 mg via ORAL
  Filled 2022-01-07 (×6): qty 1

## 2022-01-07 NOTE — Progress Notes (Signed)
Sylvia Petersen  WER:[id number] DOB: 1929-07-18 DOA: 01/05/2022 PCP: [name], MD    Brief Narrative:  88 year old with a history of HTN, HLD, and OA who lives independently who was found by her home health aide today to be in the severely weakened state unable to safely ambulate or care for herself at home.  EMS was summoned and the patient was transported to the ER where she reported generalized body aches and severe generalized weakness with no other localizing symptoms.  She was COVID and influenza negative in the emergency room.  Her WBC was marginally elevated at 10.6.  BUN was elevated at 27 with a normal creatinine.  Urinalysis was grossly positive with nitrates, large leukocyte esterase, and greater than 50 WBC. At the time of my visit she is quite sedate/lethargic, but is able to answer some directed questions. She denies specific complaints, but tells me she became so weak she found she could no longer walk today.   Consultants:  None  Goals of Care:  Code Status: Full Code   DVT prophylaxis: Lovenox  Interim Hx: No acute events reported overnight.  Afebrile with stable vitals.  Physical therapy has evaluated the patient and recommends a SNF rehab stay.  The patient is much more alert and conversant today.  She is eating better.  She has no focal complaints.  She tells me she is willing to be placed in a rehab facility.  Assessment & Plan:  E coli UTI POA Continue empiric antibiotic therapy and narrow spectrum as able when sensitivities available   Progressive acute renal insufficiency Likely related to IV contrast given for CTabdom at presentation when pt was dehydrated - follow trend w/ support - no hydro noted on CT at presentation   Recent Labs  Lab 01/05/22 1204 01/06/22 0504 01/07/22 0453  CREATININE 1.00 1.05* 1.28*    Severe deconditioned state PT has suggested short term SNF rehab stay -patient is agreeable -TOC to begin bed search    Dehydration Continue volume resuscitation but slow rate as patient is now alert and eating consistently   Normocytic anemia Iron studies consistent with anemia of poor nutritional state - no evidence of gross acute blood loss -hemoglobin has declined slightly consistent with hydration   HTN Blood pressure controlled    HLD Resume usual home medication as patient now able to tolerate oral intake   Holosystolic M auscultation is suggestive of AoS - is hemodynamically stable - would not be a candidate for aggressive tx at present, so will not pursue further at this time    Disposition: Was living independently prior to this admission - needs SNF rehab stay -will be ready for discharge as soon as creatinine shows improvement   Objective: Blood pressure 122/67, pulse 75, temperature 98.2 F (36.8 C), temperature source Oral, resp. rate 16, height '5\' 2"'$  (1.575 m), SpO2 97 %.  Intake/Output Summary (Last 24 hours) at 01/07/2022 0834 Last data filed at 01/07/2022 0600 Gross per 24 hour  Intake 794.02 ml  Output 900 ml  Net -105.98 ml    There were no vitals filed for this visit.  Examination: General: No acute respiratory distress Lungs: Clear to auscultation bilaterally without wheezes or crackles Cardiovascular: Regular rate and rhythm w/ less prominent but persistent holosystolic murmur on exam today Abdomen: NT/ND, soft, BS positive, no rebound Extremities: No significant cyanosis, clubbing, or edema bilateral lower extremities  CBC: Recent Labs  Lab 01/05/22 1204 01/06/22 0504  WBC 10.6* 8.5  NEUTROABS 9.8*  --  HGB 11.2* 10.4*  HCT 35.3* 32.9*  MCV 95.7 94.0  PLT 320 916    Basic Metabolic Panel: Recent Labs  Lab 01/05/22 1204 01/06/22 0504 01/07/22 0453  NA 139 142 139  K 4.5 4.0 3.5  CL 109 114* 114*  CO2 23 21* 21*  GLUCOSE 118* 139* 102*  BUN 27* 24* 32*  CREATININE 1.00 1.05* 1.28*  CALCIUM 10.1 9.0 8.6*    GFR: CrCl cannot be calculated  (Unknown ideal weight.).   Scheduled Meds:  amLODipine  2.5 mg Oral Daily   enoxaparin (LOVENOX) injection  40 mg Subcutaneous Q24H   lisinopril  20 mg Oral QHS   metoprolol tartrate  50 mg Oral BID   multivitamin with minerals  1 tablet Oral Daily   pantoprazole  40 mg Oral Daily   senna  1 tablet Oral BID   tamsulosin  0.4 mg Oral Daily   Continuous Infusions:  sodium chloride 75 mL/hr at 01/06/22 1518   cefTRIAXone (ROCEPHIN)  IV 1 g (01/06/22 1521)     LOS: 1 day   Cherene Altes, MD [facility] Office  [phone] Pager - Text Page per Shea Evans  If 7PM-7AM, please contact night-coverage per Amion 01/07/2022, 8:34 AM

## 2022-01-07 NOTE — Evaluation (Signed)
Occupational Therapy Evaluation Patient Details Name: Sylvia Petersen MRN: [id number] DOB: [birth year] Today's Date: 01/07/2022   History of Present Illness 88 year old admitted with  generalized body aches,  severe generalized weakness. COVID and influenza negative. admitted with UTI.   PMH: HTN, HLD, and OA   .   Clinical Impression   Pt typically gets assist from caregivers for bathing/dressing each morning and night. Her "friend" (boyfriend) assists as well. She is dependent in IADL. She typically walks with Rollator. Today she is max A for bed mobility, able to come to standing with max A from OT, but zero balance and unable to take any steps (or even march in place). Pt was able to progress from mod A sitting EOB to brief moments of min guard and participate in some LB exercises and grooming tasks, Pt needing max A to return supine. Pt left with music on in the room. At this time recommending continued skilled OT in the acute setting as well as afterwards at the SNF level to maximize safety and independence in ADL and functional transfers. Pt agreeable at this time.     Recommendations for follow up therapy are one component of a multi-disciplinary discharge planning process, led by the attending physician.  Recommendations may be updated based on patient status, additional functional criteria and insurance authorization.   Follow Up Recommendations  Skilled nursing-short term rehab (<3 hours/day)    Assistance Recommended at Discharge Frequent or constant Supervision/Assistance  Patient can return home with the following Two people to help with walking and/or transfers;A lot of help with bathing/dressing/bathroom;Assistance with cooking/housework;Assistance with feeding;Direct supervision/assist for medications management;Direct supervision/assist for financial management;Assist for transportation;Help with stairs or ramp for entrance    Functional Status Assessment  Patient has had a  recent decline in their functional status and demonstrates the ability to make significant improvements in function in a reasonable and predictable amount of time.  Equipment Recommendations  Other (comment) (defer to next venue)    Recommendations for Other Services PT consult     Precautions / Restrictions Precautions Precautions: Fall Restrictions Weight Bearing Restrictions: No      Mobility Bed Mobility Overal bed mobility: Needs Assistance Bed Mobility: Supine to Sit, Sit to Supine     Supine to sit: Max assist, HOB elevated Sit to supine: Max assist   General bed mobility comments: assist to progress LEs off bed and to elevate trunk, assist to control trunk descent and lift LEs on to bed    Transfers Overall transfer level: Needs assistance Equipment used: 1 person hand held assist Transfers: Sit to/from Stand Sit to Stand: Max assist           General transfer comment: assist with anterior-superior wt shift, difficulty coming to full stand, trunk and hips flexed, require blocking knees      Balance Overall balance assessment: Needs assistance Sitting-balance support: Feet supported, Bilateral upper extremity supported Sitting balance-Leahy Scale: Poor Sitting balance - Comments: with incr time and feet on floor pt able to briefly maintain min guard EOB - mostly mod to min A Postural control: Posterior lean Standing balance support: Reliant on assistive device for balance, Bilateral upper extremity supported, During functional activity Standing balance-Leahy Scale: Zero Standing balance comment: reliant on external assist                           ADL either performed or assessed with clinical judgement   ADL Overall ADL's :  Needs assistance/impaired Eating/Feeding: Set up;Minimal assistance Eating/Feeding Details (indicate cue type and reason): fatigues but able to bring hand to mouth and hold utensils, needs to be in upright position in the  bed Grooming: Set up;Minimal assistance;Wash/dry face;Sitting Grooming Details (indicate cue type and reason): fatigues quickly Upper Body Bathing: Moderate assistance Upper Body Bathing Details (indicate cue type and reason): baseline Lower Body Bathing: Maximal assistance   Upper Body Dressing : Moderate assistance;Sitting Upper Body Dressing Details (indicate cue type and reason): doning extra gown Lower Body Dressing: Total assistance;Bed level   Toilet Transfer: Maximal assistance;+2 for safety/equipment   Toileting- Clothing Manipulation and Hygiene: Maximal assistance;Bed level       Functional mobility during ADLs: Maximal assistance;+2 for physical assistance;+2 for safety/equipment General ADL Comments: decreased activity tolerance, decreased balance, decreased cognition     Vision         Perception     Praxis      Pertinent Vitals/Pain Pain Assessment Pain Assessment: Faces Faces Pain Scale: Hurts a little bit Pain Location: generalized Pain Descriptors / Indicators: Grimacing Pain Intervention(s): Monitored during session, Repositioned     Hand Dominance     Extremity/Trunk Assessment Upper Extremity Assessment Upper Extremity Assessment: Generalized weakness (able to bring hand to mouth and grasp utensils, fatigues quickly)   Lower Extremity Assessment Lower Extremity Assessment: Defer to PT evaluation   Cervical / Trunk Assessment Cervical / Trunk Assessment: Kyphotic   Communication Communication Communication: HOH   Cognition Arousal/Alertness: Awake/alert Behavior During Therapy: Flat affect Overall Cognitive Status: No family/caregiver present to determine baseline cognitive functioning Area of Impairment: Following commands, Safety/judgement, Problem solving                       Following Commands: Follows one step commands with increased time, Follows multi-step commands inconsistently Safety/Judgement: Decreased awareness of  deficits   Problem Solving: Slow processing, Decreased initiation, Requires tactile cues General Comments: trouble with word finding at times when asked who her favorite singer was she could not name one singer, unreliable historian for how much assist she was getting, required multimodal cues     General Comments       Exercises     Shoulder Instructions      Home Living Family/patient expects to be discharged to:: Skilled nursing facility Living Arrangements: Alone                               Additional Comments: pt has caregivers from 8-12 and 5-9 each day. she is alone inbetween      Prior Functioning/Environment Prior Level of Function : Needs assist             Mobility Comments: amb with rollator, household amb at baseline ADLs Comments: caregivers assist with bathing, dressing and bed time routine at end of day, dependent for IADL        OT Problem List: Decreased strength;Decreased activity tolerance;Impaired balance (sitting and/or standing);Decreased cognition;Decreased safety awareness;Decreased knowledge of use of DME or AE      OT Treatment/Interventions: Self-care/ADL training;Therapeutic exercise;DME and/or AE instruction;Therapeutic activities;Cognitive remediation/compensation;Patient/family education;Balance training    OT Goals(Current goals can be found in the care plan section) Acute Rehab OT Goals Patient Stated Goal: to get home OT Goal Formulation: With patient Time For Goal Achievement: 01/21/22 Potential to Achieve Goals: Good ADL Goals Pt Will Perform Grooming: sitting;with supervision Pt Will Perform Upper Body Dressing: with  min guard assist;sitting Pt Will Transfer to Toilet: with supervision;bedside commode Pt Will Perform Toileting - Clothing Manipulation and hygiene: with min guard assist;with caregiver independent in assisting;sit to/from stand Additional ADL Goal #1: Pt will perform bed mobility at supervision level  prior to engaging in ADL  OT Frequency: Min 2X/week    Co-evaluation              AM-PAC OT "6 Clicks" Daily Activity     Outcome Measure Help from another person eating meals?: A Little Help from another person taking care of personal grooming?: A Little Help from another person toileting, which includes using toliet, bedpan, or urinal?: A Lot Help from another person bathing (including washing, rinsing, drying)?: A Lot Help from another person to put on and taking off regular upper body clothing?: A Lot Help from another person to put on and taking off regular lower body clothing?: Total 6 Click Score: 13   End of Session Equipment Utilized During Treatment: Gait belt Nurse Communication: Mobility status;Precautions  Activity Tolerance: Patient limited by fatigue Patient left: in bed;with call bell/phone within reach;with bed alarm set  OT Visit Diagnosis: Unsteadiness on feet (R26.81);Muscle weakness (generalized) (M62.81);History of falling (Z91.81);Other symptoms and signs involving cognitive function;Adult, failure to thrive (R62.7)                Time: 8592-9244 OT Time Calculation (min): 30 min Charges:  OT General Charges $OT Visit: 1 Visit OT Evaluation $OT Eval Moderate Complexity: 1 Mod OT Treatments $Self Care/Home Management : 8-22 mins  Jesse Sans OTR/L Acute Rehabilitation Services Office: [place] 01/07/2022, 5:54 PM

## 2022-01-07 NOTE — Progress Notes (Signed)
PHARMACIST - PHYSICIAN ORDER COMMUNICATION  CONCERNING: P&T Medication Policy on Herbal Medications  DESCRIPTION:  This patient's order for:  Prevagen has been noted.  This product(s) is classified as an "herbal" or natural product. Due to a lack of definitive safety studies or FDA approval, nonstandard manufacturing practices, plus the potential risk of unknown drug-drug interactions while on inpatient medications, the Pharmacy and Therapeutics Committee does not permit the use of "herbal" or natural products of this type within [place].   ACTION TAKEN: The pharmacy department is unable to verify this order at this time and your patient has been informed of this safety policy. Please reevaluate patient's clinical condition at discharge and address if the herbal or natural product(s) should be resumed at that time.   

## 2022-01-08 DIAGNOSIS — N3000 Acute cystitis without hematuria: Secondary | ICD-10-CM | POA: Diagnosis not present

## 2022-01-08 LAB — BASIC METABOLIC PANEL
Anion gap: 6 (ref 5–15)
BUN: 21 mg/dL (ref 8–23)
CO2: 20 mmol/L — ABNORMAL LOW (ref 22–32)
Calcium: 8.8 mg/dL — ABNORMAL LOW (ref 8.9–10.3)
Chloride: 111 mmol/L (ref 98–111)
Creatinine, Ser: 0.93 mg/dL (ref 0.44–1.00)
GFR, Estimated: 58 mL/min — ABNORMAL LOW (ref >60)
Glucose, Bld: 102 mg/dL — ABNORMAL HIGH (ref 70–99)
Potassium: 3.6 mmol/L (ref 3.5–5.1)
Sodium: 137 mmol/L (ref 135–145)

## 2022-01-08 MED ORDER — CEPHALEXIN 500 MG PO CAPS: 500.0000 mg | ORAL_CAPSULE | Freq: Two times a day (BID) | ORAL | Status: DC

## 2022-01-08 MED ORDER — CEPHALEXIN 500 MG PO CAPS
500.0000 mg | ORAL_CAPSULE | Freq: Two times a day (BID) | ORAL | Status: DC
  Administered 2022-01-08 – 2022-01-11 (×7): 500 mg via ORAL
  Filled 2022-01-08 (×7): qty 1

## 2022-01-08 NOTE — TOC Initial Note (Addendum)
Transition of Care [facility]) - Initial/Assessment Note    Patient Details  Name: Sylvia Petersen MRN: [id number] Date of Birth: 1929-11-09  Transition of Care [facility] CM/SW Contact:    Vassie Moselle, LCSW Phone Number: 01/08/2022, 12:40 PM  Clinical Narrative:                 Spoke with pt's son and confirmed plan for SNF placement. Pt has been to SNF in the past at [facility] and states this is their top choice for placement.  Referrals have been sent out for placement and currently awaiting bed offers.   Update 2:00pm- Accepted bed offer for [facility]. Insurance Josem Kaufmann has been requested and currently awaiting approval.   Expected Discharge Plan: Skilled Nursing Facility Barriers to Discharge: No Barriers Identified   Patient Goals and CMS Choice Patient states their goals for this hospitalization and ongoing recovery are:: To go to SNF CMS Medicare.gov Compare Post Acute Care list provided to:: Patient Represenative (must comment) (Son) Choice offered to / list presented to : Adult Children  Expected Discharge Plan and Services Expected Discharge Plan: [place] In-house Referral: NA Discharge Planning Services: CM Consult Post Acute Care Choice: [place] Living arrangements for the past 2 months: Single Family Home                 DME Arranged: N/A DME Agency: NA                  Prior Living Arrangements/Services Living arrangements for the past 2 months: Single Family Home Lives with:: Self Patient language and need for interpreter reviewed:: Yes Do you feel safe going back to the place where you live?: Yes      Need for Family Participation in Patient Care: Yes (Comment) Care giver support system in place?: No (comment)   Criminal Activity/Legal Involvement Pertinent to Current Situation/Hospitalization: No - Comment as needed  Activities of Daily Living Home Assistive Devices/Equipment: Walker (specify type),  Eyeglasses ADL Screening (condition at time of admission) Patient's cognitive ability adequate to safely complete daily activities?: No Is the patient deaf or have difficulty hearing?: No Does the patient have difficulty seeing, even when wearing glasses/contacts?: No Does the patient have difficulty concentrating, remembering, or making decisions?: Yes (very slow talking) Patient able to express need for assistance with ADLs?: Yes Does the patient have difficulty dressing or bathing?: Yes Independently performs ADLs?: No Communication: Independent Dressing (OT): Needs assistance Is this a change from baseline?: Pre-admission baseline Grooming: Needs assistance Is this a change from baseline?: Pre-admission baseline Feeding: Needs assistance Is this a change from baseline?: Pre-admission baseline Bathing: Needs assistance Is this a change from baseline?: Pre-admission baseline Toileting: Needs assistance Is this a change from baseline?: Pre-admission baseline In/Out Bed: Needs assistance Is this a change from baseline?: Pre-admission baseline Walks in Home: Needs assistance Is this a change from baseline?: Pre-admission baseline Does the patient have difficulty walking or climbing stairs?: Yes Weakness of Legs: Both Weakness of Arms/Hands: Both  Permission Sought/Granted Permission sought to share information with : Facility Sport and exercise psychologist, Family Supports Permission granted to share information with : No              Emotional Assessment Appearance:: Other (Comment Required (Spoke with son via t/c) Attitude/Demeanor/Rapport: Unable to Assess Affect (typically observed): Unable to Assess Orientation: : Oriented to Self, Oriented to Place Alcohol / Substance Use: Not Applicable Psych Involvement: No (comment)  Admission diagnosis:  Dehydration [  E86.0] UTI (urinary tract infection) [N39.0] Weakness [R53.1] Failure to thrive in adult [R62.7] Acute cystitis without  hematuria [N30.00] Patient Active Problem List   Diagnosis Date Noted   UTI (urinary tract infection) 01/05/2022   SIRS (systemic inflammatory response syndrome) ([place]) 02/12/2020   ARF (acute renal failure) ([place]) 02/11/2020   Acute lower UTI 02/11/2020   Hypertensive urgency 02/11/2020   COVID 02/11/2020   Postoperative state 04/25/2016   PCP:  [name], MD Pharmacy:   [facility] [address] [place] [address] Phone: [phone] Fax: [phone]     Social Determinants of Health (SDOH) Interventions    Readmission Risk Interventions    01/08/2022   12:39 PM  Readmission Risk Prevention Plan  Post Dischage Appt Complete  Medication Screening Complete  Transportation Screening Complete

## 2022-01-08 NOTE — [place] FL2 (Signed)
[place] MEDICAID FL2 LEVEL OF CARE SCREENING TOOL     IDENTIFICATION  Patient Name: Sylvia Petersen Birthdate: Jun 15, 1929 Sex: female Admission Date (Current Location): 01/05/2022  [facility] [place] Number:  Herbalist and Address:  [facility],  [address] [place], [place]      Provider Number: [id number]  Attending Physician Name and Address:  Cherene Altes, MD  Relative Name and Phone Number:  [name], [name] [phone]    Current Level of Care: Hospital Recommended Level of Care: [place] Prior Approval Number:    Date Approved/Denied:   PASRR Number: [id number]  Discharge Plan: SNF    Current Diagnoses: Patient Active Problem List   Diagnosis Date Noted   UTI (urinary tract infection) 01/05/2022   SIRS (systemic inflammatory response syndrome) ([place]) 02/12/2020   ARF (acute renal failure) ([place]) 02/11/2020   Acute lower UTI 02/11/2020   Hypertensive urgency 02/11/2020   COVID 02/11/2020   Postoperative state 04/25/2016    Orientation RESPIRATION BLADDER Height & Weight     Self, Place  Normal Incontinent, External catheter Weight: 131 lb (59.4 kg) Height:  '5\' 3"'$  (160 cm)  BEHAVIORAL SYMPTOMS/MOOD NEUROLOGICAL BOWEL NUTRITION STATUS      Continent Diet (Regular)  AMBULATORY STATUS COMMUNICATION OF NEEDS Skin   Extensive Assist Verbally Normal                       Personal Care Assistance Level of Assistance  Bathing, Feeding, Dressing Bathing Assistance: Maximum assistance Feeding assistance: Limited assistance Dressing Assistance: Maximum assistance     Functional Limitations Info  Sight, Hearing, Speech Sight Info: Impaired Hearing Info: Impaired Speech Info: Adequate    SPECIAL CARE FACTORS FREQUENCY  PT (By licensed PT), OT (By licensed OT)     PT Frequency: 5x/wk OT Frequency: 5x/wk            Contractures Contractures Info: Not present    Additional Factors Info   Code Status, Allergies Code Status Info: FULL Allergies Info: No Known Allergies           Current Medications (01/08/2022):  This is the current hospital active medication list Current Facility-Administered Medications  Medication Dose Route Frequency Provider Last Rate Last Admin   acetaminophen (TYLENOL) tablet 650 mg  650 mg Oral Q6H PRN Cherene Altes, MD   650 mg at 01/06/22 2055   bisacodyl (DULCOLAX) suppository 10 mg  10 mg Rectal Daily PRN Cherene Altes, MD       cephALEXin (KEFLEX) capsule 500 mg  500 mg Oral Q12H Joette Catching T, MD   500 mg at 01/08/22 0911   enoxaparin (LOVENOX) injection 40 mg  40 mg Subcutaneous Q24H Cherene Altes, MD   40 mg at 01/07/22 2109   fesoterodine (TOVIAZ) tablet 4 mg  4 mg Oral Daily Joette Catching T, MD   4 mg at 01/08/22 0911   metoprolol tartrate (LOPRESSOR) tablet 50 mg  50 mg Oral BID Cherene Altes, MD   50 mg at 01/08/22 1448   multivitamin with minerals tablet 1 tablet  1 tablet Oral Daily Cherene Altes, MD   1 tablet at 01/08/22 0911   ondansetron (ZOFRAN) tablet 4 mg  4 mg Oral Q6H PRN Cherene Altes, MD       Or   ondansetron [facility]) injection 4 mg  4 mg Intravenous Q6H PRN Cherene Altes, MD  pantoprazole (PROTONIX) EC tablet 40 mg  40 mg Oral Daily Joette Catching T, MD   40 mg at 01/08/22 0911   polyethylene glycol (MIRALAX / GLYCOLAX) packet 17 g  17 g Oral Daily PRN Cherene Altes, MD       polyvinyl alcohol (LIQUIFILM TEARS) 1.4 % ophthalmic solution   Both Eyes Daily PRN Cherene Altes, MD       pravastatin (PRAVACHOL) tablet 20 mg  20 mg Oral q1800 Cherene Altes, MD   20 mg at 01/07/22 1759   senna (SENOKOT) tablet 8.6 mg  1 tablet Oral BID Joette Catching T, MD   8.6 mg at 01/07/22 2109   tamsulosin (FLOMAX) capsule 0.4 mg  0.4 mg Oral Daily Joette Catching T, MD   0.4 mg at 01/08/22 7414   traMADol (ULTRAM) tablet 50 mg  50 mg Oral Q6H PRN Cherene Altes, MD          Discharge Medications: Please see discharge summary for a list of discharge medications.  Relevant Imaging Results:  Relevant Lab Results:   Additional Information SSN: [id number]  Vassie Moselle, LCSW

## 2022-01-08 NOTE — Progress Notes (Signed)
Physical Therapy Treatment Patient Details Name: Sylvia Petersen MRN: [id number] DOB: June 16, 1929 Today's Date: 01/08/2022   History of Present Illness 88 year old admitted with  generalized body aches,  severe generalized weakness. COVID and influenza negative. admitted with UTI.   PMH: HTN, HLD, and OA   .    PT Comments    Soriya is cooperative and pleasant, willing to work with PT states she wants to walk soon. Pt demonstrates incr ability to self assist with bed mobility and transfers however continues to require 2 person assist.  Improved standing today with 2 assist, able to transfer to chair. Pillows placed for comfort/positioning.  Recommend SNF for continued rehab   Recommendations for follow up therapy are one component of a multi-disciplinary discharge planning process, led by the attending physician.  Recommendations may be updated based on patient status, additional functional criteria and insurance authorization.  Follow Up Recommendations  Skilled nursing-short term rehab (<3 hours/day) Can patient physically be transported by private vehicle: No   Assistance Recommended at Discharge Frequent or constant Supervision/Assistance  Patient can return home with the following Two people to help with walking and/or transfers;Two people to help with bathing/dressing/bathroom;Help with stairs or ramp for entrance;Assist for transportation;Assistance with cooking/housework   Equipment Recommendations  None recommended by PT    Recommendations for Other Services       Precautions / Restrictions Precautions Precautions: Fall Restrictions Weight Bearing Restrictions: No     Mobility  Bed Mobility Overal bed mobility: Needs Assistance Bed Mobility: Supine to Sit     Supine to sit: Max assist, +2 for safety/equipment     General bed mobility comments: assist to progress LEs off bed and to elevate trunk    Transfers Overall transfer level: Needs assistance Equipment  used: Rolling walker (2 wheels), 2 person hand held assist Transfers: Sit to/from Stand, Bed to chair/wheelchair/BSC Sit to Stand: Mod assist, +2 physical assistance, +2 safety/equipment Stand pivot transfers: Max assist, +2 physical assistance, +2 safety/equipment         General transfer comment: assist with anterior-superior wt shift, difficulty coming to full stand, trunk and hips flexed, require blocking knees. pt not using walker to assist self, improved standing effort with 2 person assist. pt fearful of falling. max assist to stand and pivot to chair with PT and tech assisting throughout with trunk/knee/hip extension as well as pivot to chair    Ambulation/Gait                   Stairs             Wheelchair Mobility    Modified Rankin (Stroke Patients Only)       Balance   Sitting-balance support: Feet supported, Bilateral upper extremity supported Sitting balance-Leahy Scale: Poor Sitting balance - Comments: with incr time and feet on floor pt able to briefly maintain min guard EOB - mostly mod to min A Postural control: Left lateral lean, Posterior lean Standing balance support: Reliant on assistive device for balance, Bilateral upper extremity supported, During functional activity Standing balance-Leahy Scale: Zero Standing balance comment: reliant on external assist                            Cognition Arousal/Alertness: Awake/alert Behavior During Therapy: Flat affect Overall Cognitive Status: Within Functional Limits for tasks assessed Area of Impairment: Following commands  Following Commands: Follows one step commands with increased time, Follows multi-step commands with increased time     Problem Solving: Slow processing, Requires verbal cues General Comments: incr time to perform tasks and respond to basic questions        Exercises      General Comments        Pertinent Vitals/Pain Pain  Assessment Pain Assessment: No/denies pain    Home Living                          Prior Function            PT Goals (current goals can now be found in the care plan section) Acute Rehab PT Goals Patient Stated Goal: to go home (per RN family wants rehab) PT Goal Formulation: With patient Time For Goal Achievement: 01/19/22 Potential to Achieve Goals: Fair Progress towards PT goals: Progressing toward goals    Frequency    Min 2X/week      PT Plan Current plan remains appropriate    Co-evaluation              AM-PAC PT "6 Clicks" Mobility   Outcome Measure  Help needed turning from your back to your side while in a flat bed without using bedrails?: A Lot Help needed moving from lying on your back to sitting on the side of a flat bed without using bedrails?: Total Help needed moving to and from a bed to a chair (including a wheelchair)?: Total Help needed standing up from a chair using your arms (e.g., wheelchair or bedside chair)?: Total Help needed to walk in hospital room?: Total Help needed climbing 3-5 steps with a railing? : Total 6 Click Score: 7    End of Session Equipment Utilized During Treatment: Gait belt Activity Tolerance: Patient tolerated treatment well Patient left: with call bell/phone within reach;in chair;with chair alarm set   PT Visit Diagnosis: Other abnormalities of gait and mobility (R26.89);Difficulty in walking, not elsewhere classified (R26.2);Muscle weakness (generalized) (M62.81)     Time: 8563-1497 PT Time Calculation (min) (ACUTE ONLY): 18 min  Charges:  $Therapeutic Activity: 8-22 mins                     Baxter Flattery, PT  Acute Rehab Dept [facility]) [phone]  [facility] Weekend Pager  [facility] only)  [phone]  01/08/2022    [facility] 01/08/2022, 10:41 AM

## 2022-01-08 NOTE — Progress Notes (Addendum)
Sylvia Petersen  TDV:[id number] DOB: 22-Jul-1929 DOA: 01/05/2022 PCP: [name], MD    Brief Narrative:  88 year old with a history of HTN, HLD, and OA who lives independently who was found by her home health aide today to be in the severely weakened state unable to safely ambulate or care for herself at home.  EMS was summoned and the patient was transported to the ER where she reported generalized body aches and severe generalized weakness with no other localizing symptoms.  She was COVID and influenza negative in the emergency room.  Her WBC was marginally elevated at 10.6.  BUN was elevated at 27 with a normal creatinine.  Urinalysis was grossly positive with nitrates, large leukocyte esterase, and greater than 50 WBC. At the time of my visit she is quite sedate/lethargic, but is able to answer some directed questions. She denies specific complaints, but tells me she became so weak she found she could no longer walk today.   Consultants:  None  Goals of Care:  Code Status: Full Code   DVT prophylaxis: Lovenox  Interim Hx: Afebrile.  Vital signs stable.  No acute events recorded overnight.  Acute renal failure has resolved with creatinine now normal.  Electrolytes balanced.  WBC normalized.  Patient is now medically stable for discharge when a rehab bed can be found.  Sitting up in a bedside chair in good spirits.  Denies new complaints.  Assessment & Plan:  PCN resistant E coli UTI POA Organism has proven to be susceptible to cephalosporins -transition to oral Keflex today -clinically rapidly improving - of note the organism is resistant to ampicillin  Progressive acute renal insufficiency Likely related to IV contrast given for CTabdom at presentation when pt was dehydrated - no hydro noted on CT at presentation -has now resolved with normalization of renal function  Severe deconditioned state PT has suggested short term SNF rehab stay -patient is agreeable -TOC to begin bed  search  Toxic metabolic encephalopathy due to UTI and dehydration  Resolved w/ tx of inciting etiologies    Dehydration Resolved with volume resuscitation -discontinue IV fluid today   Normocytic anemia Iron studies consistent with anemia of poor nutritional state - no evidence of gross acute blood loss -hemoglobin has declined slightly consistent with hydration   HTN Blood pressure controlled    HLD Continue usual home medical therapy   Holosystolic M auscultation is suggestive of AoS - is hemodynamically stable - would not be a candidate for aggressive tx at present, so will not pursue further at this time    Disposition: Was living independently prior to this admission - needs SNF rehab stay -medically clear for discharge at this time awaiting SNF bed   Objective: Blood pressure 132/68, pulse 82, temperature 98 F (36.7 C), resp. rate 14, height '5\' 2"'$  (1.575 m), SpO2 94 %.  Intake/Output Summary (Last 24 hours) at 01/08/2022 0819 Last data filed at 01/08/2022 0606 Gross per 24 hour  Intake 1705.9 ml  Output 1050 ml  Net 655.9 ml    There were no vitals filed for this visit.  Examination: General: No acute respiratory distress Lungs: Clear to auscultation bilaterally -no wheezing Cardiovascular: RRR without gallop Abdomen: NT/ND, soft, BS positive, no rebound Extremities: No significant cyanosis, clubbing, or edema bilateral lower extremities  CBC: Recent Labs  Lab 01/05/22 1204 01/06/22 0504  WBC 10.6* 8.5  NEUTROABS 9.8*  --   HGB 11.2* 10.4*  HCT 35.3* 32.9*  MCV 95.7 94.0  PLT  320 438    Basic Metabolic Panel: Recent Labs  Lab 01/06/22 0504 01/07/22 0453 01/08/22 0420  NA 142 139 137  K 4.0 3.5 3.6  CL 114* 114* 111  CO2 21* 21* 20*  GLUCOSE 139* 102* 102*  BUN 24* 32* 21  CREATININE 1.05* 1.28* 0.93  CALCIUM 9.0 8.6* 8.8*    GFR: CrCl cannot be calculated (Unknown ideal weight.).   Scheduled Meds:  enoxaparin (LOVENOX) injection  40  mg Subcutaneous Q24H   fesoterodine  4 mg Oral Daily   metoprolol tartrate  50 mg Oral BID   multivitamin with minerals  1 tablet Oral Daily   pantoprazole  40 mg Oral Daily   pravastatin  20 mg Oral q1800   senna  1 tablet Oral BID   tamsulosin  0.4 mg Oral Daily   Continuous Infusions:  sodium chloride 60 mL/hr at 01/07/22 2114   cefTRIAXone (ROCEPHIN)  IV 1 g (01/07/22 1801)     LOS: 2 days   Cherene Altes, MD [facility] Office  [phone] Pager - Text Page per Shea Evans  If 7PM-7AM, please contact night-coverage per Amion 01/08/2022, 8:19 AM

## 2022-01-09 DIAGNOSIS — R627 Adult failure to thrive: Secondary | ICD-10-CM | POA: Diagnosis not present

## 2022-01-09 DIAGNOSIS — N3000 Acute cystitis without hematuria: Secondary | ICD-10-CM | POA: Diagnosis not present

## 2022-01-09 DIAGNOSIS — R531 Weakness: Secondary | ICD-10-CM | POA: Diagnosis not present

## 2022-01-09 DIAGNOSIS — E860 Dehydration: Secondary | ICD-10-CM | POA: Diagnosis not present

## 2022-01-09 MED ORDER — CEPHALEXIN 500 MG PO CAPS: 500.0000 mg | ORAL_CAPSULE | Freq: Two times a day (BID) | ORAL | 0 refills | Status: AC

## 2022-01-09 NOTE — Discharge Summary (Signed)
DISCHARGE SUMMARY  Sylvia Petersen  MR#: [id number]  DOB:November 09, 1929  Date of Admission: 01/05/2022 Date of Discharge: 01/11/2022  Attending Physician:Ramesh Lupita Leash, MD  Patient's YDX:AJOIN, Sylvia Hope, MD  Consults: none  Disposition: D/C to SNF for rehab stay   Follow-up Appts:  Contact information for after-discharge care     Destination     [facility] Preferred SNF .   Service: Skilled Nursing Contact information: [facility] [phone]                     Discharge Diagnoses: PCN resistant E coli UTI POA Progressive acute renal insufficiency Severe deconditioned state Toxic metabolic encephalopathy due to UTI and dehydration  Dehydration Normocytic anemia HTN HLD Holosystolic M  Initial presentation: 88 year old with a history of HTN, HLD, and OA who lives independently who was found by her home health aide in a severely weakened state unable to safely ambulate or care for herself at home.  EMS was summoned and the patient was transported to the ER where she reported generalized body aches and severe generalized weakness with no other localizing symptoms.  She was COVID and influenza negative in the emergency room.  Her WBC was marginally elevated at 10.6.  BUN was elevated at 27 with a normal creatinine.  Urinalysis was grossly positive with nitrates, large leukocyte esterase, and greater than 50 WBC.   Hospital Course:  PCN resistant E coli UTI POA Organism has proven to be susceptible to cephalosporins - transitioned to oral Keflex to complete 7 full days of tx - clinically rapidly improved - of note the organism is resistant to ampicillin   Progressive acute renal insufficiency Likely related to IV contrast given for CTabdom at presentation when pt was dehydrated - no hydro noted on CT at presentation - resolved with normalization of renal function   Severe deconditioned state PT has suggested short term  SNF rehab stay - patient is agreeable -TOC made arrangements and her son agreed   Toxic metabolic encephalopathy due to UTI and dehydration  Resolved w/ tx of inciting etiologies    Dehydration Resolved with volume resuscitation   Normocytic anemia Iron studies consistent with anemia of poor nutritional state - no evidence of gross acute blood loss - hemoglobin has declined slightly consistent with hydration   HTN Blood pressure appropriately controlled    HLD Continue usual home medical therapy   Holosystolic M auscultation is suggestive of AoS - is hemodynamically stable - would not be a candidate for aggressive tx at present, so will not pursue further at this time     Allergies as of 01/11/2022   No Known Allergies      Medication List     STOP taking these medications    albuterol 108 (90 Base) MCG/ACT inhaler Commonly known as: VENTOLIN HFA   amLODipine 2.5 MG tablet Commonly known as: NORVASC   lisinopril 20 MG tablet Commonly known as: ZESTRIL       TAKE these medications    acetaminophen 500 MG tablet Commonly known as: TYLENOL Take 1,000 mg by mouth every 6 (six) hours as needed for mild pain.   cephALEXin 500 MG capsule Commonly known as: KEFLEX Take 1 capsule (500 mg total) by mouth every 12 (twelve) hours for 2 days.   CLEAR EYES FOR DRY EYES OP Apply 1 drop to eye daily as needed (for dry eyes).   Fish Oil 1000 MG Caps Take 1,000 mg by mouth daily.  lovastatin 20 MG tablet Commonly known as: MEVACOR Take 20 mg by mouth daily.   metoprolol tartrate 50 MG tablet Commonly known as: LOPRESSOR Take 50 mg by mouth 2 (two) times daily.   multivitamin with minerals Tabs tablet Take 1 tablet by mouth daily.   pantoprazole 40 MG tablet Commonly known as: Protonix Take 1 tablet (40 mg total) by mouth daily. What changed:  when to take this reasons to take this   Prevagen Extra Strength 20 MG Caps Generic drug: Apoaequorin Take 40 mg by  mouth daily.   solifenacin 10 MG tablet Commonly known as: VESICARE Take 10 mg by mouth at bedtime.   tamsulosin 0.4 MG Caps capsule Commonly known as: FLOMAX Take 1 capsule (0.4 mg total) by mouth daily.        Day of Discharge BP 133/63 (BP Location: Left Arm)   Pulse 67   Temp 98.7 F (37.1 C) (Oral)   Resp 16   Ht '5\' 3"'$  (1.6 m)   Wt 59.4 kg   SpO2 93%   BMI 23.21 kg/m   Physical Exam: General: No acute respiratory distress Lungs: Clear to auscultation bilaterally without wheezes or crackles Cardiovascular: Regular rate and rhythm without murmur gallop or rub normal S1 and S2 Abdomen: Nontender, nondistended, soft, bowel sounds positive, no rebound, no ascites, no appreciable mass Extremities: No significant cyanosis, clubbing, or edema bilateral lower extremities  Basic Metabolic Panel: Recent Labs  Lab 01/05/22 1204 01/06/22 0504 01/07/22 0453 01/08/22 0420  NA 139 142 139 137  K 4.5 4.0 3.5 3.6  CL 109 114* 114* 111  CO2 23 21* 21* 20*  GLUCOSE 118* 139* 102* 102*  BUN 27* 24* 32* 21  CREATININE 1.00 1.05* 1.28* 0.93  CALCIUM 10.1 9.0 8.6* 8.8*   CBC: Recent Labs  Lab 01/05/22 1204 01/06/22 0504  WBC 10.6* 8.5  NEUTROABS 9.8*  --   HGB 11.2* 10.4*  HCT 35.3* 32.9*  MCV 95.7 94.0  PLT 320 266    Time spent in discharge (includes decision making & examination of pt): 35 minutes  01/11/2022, 10:02 AM   Cherene Altes, MD [facility] Office  [phone]

## 2022-01-09 NOTE — Care Management Important Message (Signed)
Important Message  Patient Details IM Letter given Name: Sylvia Petersen MRN: [id number] Date of Birth: 1930-02-29   Medicare Important Message Given:  Yes     Kerin Salen 01/09/2022, 12:02 PM

## 2022-01-10 DIAGNOSIS — N3000 Acute cystitis without hematuria: Secondary | ICD-10-CM | POA: Diagnosis not present

## 2022-01-10 LAB — CULTURE, BLOOD (ROUTINE X 2): Culture: NO GROWTH

## 2022-01-10 MED ORDER — SALINE SPRAY 0.65 % NA SOLN
1.0000 | NASAL | Status: DC | PRN
  Filled 2022-01-10: qty 44

## 2022-01-10 NOTE — TOC Progression Note (Deleted)
Transition of Care [facility]) - Progression Note    Patient Details  Name: LAKEENA DOWNIE MRN: [id number] Date of Birth: Jun 16, 1929  [facility] CM/SW Contact  Henrietta Dine, RN Phone Number: 01/10/2022, 3:07 PM  Clinical Narrative:    Notified by Colletta Maryland at [facility] [phone]) that the medical director does not feel SNF is appropriate for the pt; a peer to peer is requested by 9am on 01/11/22 with Dr Coralie Carpen [phone]; Dr. Maren Beach notified; Colletta Maryland also gave ambulance [place] code [phone] start date 12/29/21 end date 04/08/22.    Expected Discharge Plan: [place] Barriers to Discharge: No Barriers Identified  Expected Discharge Plan and Services Expected Discharge Plan: [place] In-house Referral: NA Discharge Planning Services: CM Consult Post Acute Care Choice: [place] Living arrangements for the past 2 months: Single Family Home                 DME Arranged: N/A DME Agency: NA                   Social Determinants of Health (SDOH) Interventions    Readmission Risk Interventions    01/08/2022   12:39 PM  Readmission Risk Prevention Plan  Post Dischage Appt Complete  Medication Screening Complete  Transportation Screening Complete

## 2022-01-10 NOTE — Progress Notes (Signed)
PROGRESS NOTE Sylvia Petersen  CBJ:[id number] DOB: [birth year] DOA: 01/05/2022 PCP: [name], MD   Brief Narrative/Hospital Course: 88 year old with a history of HTN, HLD, and OA who lives independently who was found by her home health aide in a severely weakened state unable to safely ambulate or care for herself at home.  EMS was summoned and the patient was transported to the ER where she reported generalized body aches and severe generalized weakness with no other localizing symptoms.  She was COVID and influenza negative in the emergency room.  Her WBC was marginally elevated at 10.6.  BUN was elevated at 27 with a normal creatinine.  Urinalysis was grossly positive with nitrates, large leukocyte esterase, and greater than 50 WBC.  Patient was admitted under E. coli UTI sensitive to cephalosporin> appropriately treated with plan to complete 7 days of Keflex. Overall remains stable alert oriented but deconditioned weak and frail awaiting for placement    Subjective: Seen and examined this morning resting comfortably alert awake appears weak and frail no complaints   Assessment and Plan: Principal Problem:   UTI (urinary tract infection)    E. coli UTI: Complete antibiotics with Keflex x7 days  AKI: creatinine has improved.  Minimal obstruction medication.  Encourage oral hydration  Severe deconditioning state PT OT recommending SNF waiting for approval Toxic metabolic encephalopathy in the setting of UTI and dehydration resolved, minimize psychotropic medication Dehydration resolved with fluids Normocytic anemia underwent work-up with iron studies no evidence of gross blood loss hemoglobin dropped some in the setting of hydration Hypertension BP stable Hyperlipidemia statin GERD on PPI Holosystolic murmur :at this time not a candidate for aggressive treatment-has stable hemodynamics at present-furthermore work up Not pressured  DVT prophylaxis: enoxaparin (LOVENOX) injection  40 mg Start: 01/05/22 2200 Code Status:   Code Status: Full Code Family Communication: plan of care discussed with patient at bedside. Patient status is: Inpatient because of pending placement Level of care: Med-Surg   Dispo: The patient is from: Home            Anticipated disposition: Skilled nursing facility once approved Mobility Assessment (last 72 hours)     Mobility Assessment     Row Name 01/09/22 2131 01/09/22 1100 01/08/22 1955 01/08/22 1000 01/07/22 2114   Does patient have an order for bedrest or is patient medically unstable No - Continue assessment No - Continue assessment No - Continue assessment -- No - Continue assessment   What is the highest level of mobility based on the progressive mobility assessment? Level 4 (Walks with assist in room) - Balance while marching in place and cannot step forward and back - Complete Level 4 (Walks with assist in room) - Balance while marching in place and cannot step forward and back - Complete Level 2 (Chairfast) - Balance while sitting on edge of bed and cannot stand Level 1 (Bedfast) - Unable to balance while sitting on edge of bed Level 2 (Chairfast) - Balance while sitting on edge of bed and cannot stand   Is the above level different from baseline mobility prior to current illness? Yes - Recommend PT order Yes - Recommend PT order Yes - Recommend PT order -- Yes - Recommend PT order    Jack Name 01/07/22 1700           What is the highest level of mobility based on the progressive mobility assessment? Level 2 (Chairfast) - Balance while sitting on edge of bed and cannot stand  Objective: Vitals last 24 hrs: Vitals:   01/09/22 2008 01/10/22 0553 01/10/22 0900 01/10/22 1325  BP: (!) 144/75 128/66 (!) 106/56 118/60  Pulse: 75 72 76 67  Resp: '16 16 12 16  '$ Temp: 98.6 F (37 C) 98.2 F (36.8 C) 98.1 F (36.7 C) 98.6 F (37 C)  TempSrc: Oral Oral Oral Oral  SpO2: 95% (!) 87% 94% 96%  Weight:      Height:        Weight change:   Physical Examination: General exam: alert awake, older than stated age HEENT:Oral mucosa moist, Ear/Nose WNL grossly Respiratory system: bilaterally clear BS, no use of accessory muscle Cardiovascular system: S1 & S2 +, No JVD. Gastrointestinal system: Abdomen soft,NT,ND, BS+ Nervous System:Alert, awake, moving extremities. Extremities: LE edema neg,distal peripheral pulses palpable.  Skin: No rashes,no icterus. MSK: Normal muscle bulk,tone, power  Medications reviewed:  Scheduled Meds:  cephALEXin  500 mg Oral Q12H   enoxaparin (LOVENOX) injection  40 mg Subcutaneous Q24H   fesoterodine  4 mg Oral Daily   metoprolol tartrate  50 mg Oral BID   multivitamin with minerals  1 tablet Oral Daily   pantoprazole  40 mg Oral Daily   pravastatin  20 mg Oral q1800   senna  1 tablet Oral BID   tamsulosin  0.4 mg Oral Daily   Continuous Infusions:    Diet Order             Diet regular Room service appropriate? Yes; Fluid consistency: Thin  Diet effective now                   Intake/Output Summary (Last 24 hours) at 01/10/2022 1459 Last data filed at 01/10/2022 1100 Gross per 24 hour  Intake 720 ml  Output 1100 ml  Net -380 ml   Net IO Since Admission: -79.33 mL [01/10/22 1459]  Wt Readings from Last 3 Encounters:  01/08/22 59.4 kg  02/11/20 53.1 kg  09/03/19 64 kg     Unresulted Labs (From admission, onward)    None     Data Reviewed: I have personally reviewed following labs and imaging studies CBC: Recent Labs  Lab 01/05/22 1204 01/06/22 0504  WBC 10.6* 8.5  NEUTROABS 9.8*  --   HGB 11.2* 10.4*  HCT 35.3* 32.9*  MCV 95.7 94.0  PLT 320 734   Basic Metabolic Panel: Recent Labs  Lab 01/05/22 1204 01/06/22 0504 01/07/22 0453 01/08/22 0420  NA 139 142 139 137  K 4.5 4.0 3.5 3.6  CL 109 114* 114* 111  CO2 23 21* 21* 20*  GLUCOSE 118* 139* 102* 102*  BUN 27* 24* 32* 21  CREATININE 1.00 1.05* 1.28* 0.93  CALCIUM 10.1 9.0 8.6*  8.8*   GFR: Estimated Creatinine Clearance: 33.3 mL/min (by C-G formula based on SCr of 0.93 mg/dL). Liver Function Tests: epsis Labs: Recent Labs  Lab 01/05/22 1204  LATICACIDVEN 1.7    Recent Results (from the past 240 hour(s))  Resp Panel by RT-PCR (Flu A&B, Covid) Anterior Nasal Swab     Status: None   Collection Time: 01/05/22 11:46 AM   Specimen: Anterior Nasal Swab  Result Value Ref Range Status   SARS Coronavirus 2 by RT PCR NEGATIVE NEGATIVE Final    Comment: (NOTE) SARS-CoV-2 target nucleic acids are NOT DETECTED.  The SARS-CoV-2 RNA is generally detectable in upper respiratory specimens during the acute phase of infection. The lowest concentration of SARS-CoV-2 viral copies this assay can detect is 138 copies/mL.  A negative result does not preclude SARS-Cov-2 infection and should not be used as the sole basis for treatment or other patient management decisions. A negative result may occur with  improper specimen collection/handling, submission of specimen other than nasopharyngeal swab, presence of viral mutation(s) within the areas targeted by this assay, and inadequate number of viral copies(<138 copies/mL). A negative result must be combined with clinical observations, patient history, and epidemiological information. The expected result is Negative.  Fact Sheet for Patients:  EntrepreneurPulse.com.au  Fact Sheet for Healthcare Providers:  IncredibleEmployment.be  This test is no t yet approved or cleared by the [place] FDA and  has been authorized for detection and/or diagnosis of SARS-CoV-2 by FDA under an Emergency Use Authorization (EUA). This EUA will remain  in effect (meaning this test can be used) for the duration of the COVID-19 declaration under Section 564(b)(1) of the Act, 21 U.S.C.section 360bbb-3(b)(1), unless the authorization is terminated  or revoked sooner.       Influenza A by PCR NEGATIVE  NEGATIVE Final   Influenza B by PCR NEGATIVE NEGATIVE Final    Comment: (NOTE) The Xpert Xpress SARS-CoV-2/FLU/RSV plus assay is intended as an aid in the diagnosis of influenza from Nasopharyngeal swab specimens and should not be used as a sole basis for treatment. Nasal washings and aspirates are unacceptable for Xpert Xpress SARS-CoV-2/FLU/RSV testing.  Fact Sheet for Patients: EntrepreneurPulse.com.au  Fact Sheet for Healthcare Providers: IncredibleEmployment.be  This test is not yet approved or cleared by the [place] FDA and has been authorized for detection and/or diagnosis of SARS-CoV-2 by FDA under an Emergency Use Authorization (EUA). This EUA will remain in effect (meaning this test can be used) for the duration of the COVID-19 declaration under Section 564(b)(1) of the Act, 21 U.S.C. section 360bbb-3(b)(1), unless the authorization is terminated or revoked.  Performed at [facility], [place] [address] [place], [place] [address]   Urine Culture     Status: Abnormal   Collection Time: 01/05/22  2:34 PM   Specimen: Urine, Clean Catch  Result Value Ref Range Status   Specimen Description   Final    URINE, CLEAN CATCH Performed at [facility], [place] [address], [place], [place] [address]    Special Requests   Final    NONE Performed at [facility], [place] [address], [place],  [address]    Culture >=100,000 COLONIES/mL ESCHERICHIA COLI (A)  Final   Report Status 01/07/2022 FINAL  Final   Organism ID, Bacteria ESCHERICHIA COLI (A)  Final      Susceptibility   Escherichia coli - MIC*    AMPICILLIN >=32 RESISTANT Resistant     CEFAZOLIN 16 SENSITIVE Sensitive     CEFEPIME <=0.12 SENSITIVE Sensitive     CEFTRIAXONE <=0.25 SENSITIVE Sensitive     CIPROFLOXACIN <=0.25 SENSITIVE Sensitive     GENTAMICIN <=1 SENSITIVE Sensitive     IMIPENEM 0.5 SENSITIVE Sensitive      NITROFURANTOIN <=16 SENSITIVE Sensitive     TRIMETH/SULFA <=20 SENSITIVE Sensitive     AMPICILLIN/SULBACTAM >=32 RESISTANT Resistant     PIP/TAZO <=4 SENSITIVE Sensitive     * >=100,000 COLONIES/mL ESCHERICHIA COLI  Culture, blood (routine x 2)     Status: None   Collection Time: 01/05/22  4:30 PM   Specimen: BLOOD LEFT FOREARM  Result Value Ref Range Status   Specimen Description   Final    BLOOD LEFT FOREARM Performed at [facility],  [address], [place], [place] [address]    Special Requests   Final    BOTTLES DRAWN AEROBIC AND ANAEROBIC Blood Culture results may not be optimal due to an excessive volume of blood received in culture bottles Performed at [place] [address] [place], [place] [address]    Culture   Final    NO GROWTH 5 DAYS Performed at [facility], [place] [address], [place], [place] [address]    Report Status 01/10/2022 FINAL  Final  Culture, blood (routine x 2)     Status: None (Preliminary result)   Collection Time: 01/06/22  4:55 AM   Specimen: Left Antecubital; Blood  Result Value Ref Range Status   Specimen Description   Final    LEFT ANTECUBITAL BLOOD Performed at [facility], [place] [address] [place], [place] [address]    Special Requests   Final    BOTTLES DRAWN AEROBIC ONLY Blood Culture adequate volume Performed at [place] [address], [place], [place] [address]    Culture   Final    NO GROWTH 4 DAYS Performed at [facility], [place] [address], [place],  [address]    Report Status PENDING  Incomplete    Antimicrobials: Anti-infectives (From admission, onward)    Start     Dose/Rate Route Frequency Ordered Stop   01/10/22 0000  cephALEXin (KEFLEX) 500 MG capsule        500 mg Oral Every 12 hours 01/09/22 1431 01/12/22 2359   01/08/22 1000  cephALEXin (KEFLEX) capsule 500 mg  Status:  Discontinued        500 mg Oral Every 12 hours 01/08/22 0822 01/08/22  0823   01/08/22 1000  cephALEXin (KEFLEX) capsule 500 mg        500 mg Oral Every 12 hours 01/08/22 0823 01/12/22 0959   01/06/22 1600  cefTRIAXone (ROCEPHIN) 1 g in sodium chloride 0.9 % 100 mL IVPB  Status:  Discontinued        1 g 200 mL/hr over 30 Minutes Intravenous Every 24 hours 01/05/22 1646 01/05/22 1646   01/06/22 1600  cefTRIAXone (ROCEPHIN) 1 g in sodium chloride 0.9 % 100 mL IVPB  Status:  Discontinued        1 g 200 mL/hr over 30 Minutes Intravenous Every 24 hours 01/05/22 1646 01/08/22 0822   01/06/22 0000  cefTRIAXone (ROCEPHIN) 1 g in sodium chloride 0.9 % 100 mL IVPB  Status:  Discontinued        1 g 200 mL/hr over 30 Minutes Intravenous Every 24 hours 01/05/22 1646 01/05/22 1646   01/05/22 1700  cefTRIAXone (ROCEPHIN) 1 g in sodium chloride 0.9 % 100 mL IVPB  Status:  Discontinued        1 g 200 mL/hr over 30 Minutes Intravenous Every 24 hours 01/05/22 1645 01/05/22 1645   01/05/22 1700  cefTRIAXone (ROCEPHIN) 1 g in sodium chloride 0.9 % 100 mL IVPB  Status:  Discontinued        1 g 200 mL/hr over 30 Minutes Intravenous Every 24 hours 01/05/22 1645 01/05/22 1646   01/05/22 1545  cefTRIAXone (ROCEPHIN) 1 g in sodium chloride 0.9 % 100 mL IVPB        1 g 200 mL/hr over 30 Minutes Intravenous  Once 01/05/22 1531 01/05/22 1702      Culture/Microbiology    Component Value Date/Time   SDES  01/06/2022 0455    LEFT ANTECUBITAL  BLOOD Performed at [facility], [place] [address], [place], [place] [address]    SPECREQUEST  01/06/2022 0455    BOTTLES DRAWN AEROBIC ONLY Blood Culture adequate volume Performed at [place] [address], [place], [place] [address]    CULT  01/06/2022 0455    NO GROWTH 4 DAYS Performed at [place] [address], [place], [place] [address]    REPTSTATUS PENDING 01/06/2022 [phone]    Radiology Studies: No results found.   LOS: 4 days   Antonieta Pert, MD [facility]  01/10/2022, 2:59 PM

## 2022-01-10 NOTE — Hospital Course (Addendum)
88 year old with a history of HTN, HLD, and OA who lives independently who was found by her home health aide in a severely weakened state unable to safely ambulate or care for herself at home.  EMS was summoned and the patient was transported to the ER where she reported generalized body aches and severe generalized weakness with no other localizing symptoms.  She was COVID and influenza negative in the emergency room.  Her WBC was marginally elevated at 10.6.  BUN was elevated at 27 with a normal creatinine.  Urinalysis was grossly positive with nitrates, large leukocyte esterase, and greater than 50 WBC.  Patient was admitted under E. coli UTI sensitive to cephalosporin> appropriately treated with plan to complete 7 days of Keflex. Overall remains stable alert oriented but deconditioned weak and frail awaiting for placement

## 2022-01-10 NOTE — TOC Progression Note (Addendum)
Transition of Care [facility]) - Progression Note    Patient Details  Name: Sylvia Petersen MRN: [id number] Date of Birth: June 22, 1929  [facility] CM/SW Contact  Henrietta Dine, RN Phone Number: 01/10/2022, 10:58 AM  Clinical Narrative:    Pt accepted at [facility]; called [facility] [phone]; spoke with Tammy, Rep; she says Josem Kaufmann is still pending medical review; awaiting auth.  - 1507 - Notified by Colletta Maryland at [facility] [phone]) that the medical director does not feel SNF is appropriate for the pt; a peer to peer is requested by 9am on 01/11/22 with Dr Coralie Carpen [phone]; Dr. Maren Beach notified; Colletta Maryland also gave ambulance [place] code [phone] start date 01/08/22 end date 04/08/22.   - 1515- pt's son [name] called requesting information regarding denial of auth; explained medical director of [facility] [employer] requested peer-to-peer with hospitalist; explained awaiting arrangement of meeting and decision; he verbalized understanding.  - 1600 - Requested by Dr Maren Beach to get information regarding pt's baseline mbility; attemtped to contact pt's son at [phone]; LVM; awaiting return call.  -1625 - spoke with son; pt has private duty [facility] aide twice/day to get up in morning and prepare meals then back in evening to put to bed; she uses walker to ambulate; pt's son says services are private duty thru [employer], [place], [place]; [phone]; will pass on to oncoming TOC to level of care; Dr Maren Beach notified.   Expected Discharge Plan: [place] Barriers to Discharge: No Barriers Identified  Expected Discharge Plan and Services Expected Discharge Plan: [place] In-house Referral: NA Discharge Planning Services: CM Consult Post Acute Care Choice: [place] Living arrangements for the past 2 months: Single Family Home                 DME Arranged: N/A DME Agency: NA                    Social Determinants of Health (SDOH) Interventions    Readmission Risk Interventions    01/08/2022   12:39 PM  Readmission Risk Prevention Plan  Post Dischage Appt Complete  Medication Screening Complete  Transportation Screening Complete

## 2022-01-11 DIAGNOSIS — Z9189 Other specified personal risk factors, not elsewhere classified: Secondary | ICD-10-CM | POA: Diagnosis not present

## 2022-01-11 DIAGNOSIS — F32A Depression, unspecified: Secondary | ICD-10-CM | POA: Diagnosis not present

## 2022-01-11 DIAGNOSIS — D66 Hereditary factor VIII deficiency: Secondary | ICD-10-CM | POA: Diagnosis not present

## 2022-01-11 DIAGNOSIS — G9341 Metabolic encephalopathy: Secondary | ICD-10-CM | POA: Diagnosis not present

## 2022-01-11 DIAGNOSIS — E860 Dehydration: Secondary | ICD-10-CM | POA: Diagnosis not present

## 2022-01-11 DIAGNOSIS — N183 Chronic kidney disease, stage 3 unspecified: Secondary | ICD-10-CM | POA: Diagnosis not present

## 2022-01-11 DIAGNOSIS — M25552 Pain in left hip: Secondary | ICD-10-CM | POA: Diagnosis not present

## 2022-01-11 DIAGNOSIS — Z8669 Personal history of other diseases of the nervous system and sense organs: Secondary | ICD-10-CM | POA: Diagnosis not present

## 2022-01-11 DIAGNOSIS — D638 Anemia in other chronic diseases classified elsewhere: Secondary | ICD-10-CM | POA: Diagnosis not present

## 2022-01-11 DIAGNOSIS — N390 Urinary tract infection, site not specified: Secondary | ICD-10-CM

## 2022-01-11 DIAGNOSIS — I10 Essential (primary) hypertension: Secondary | ICD-10-CM | POA: Diagnosis not present

## 2022-01-11 DIAGNOSIS — R011 Cardiac murmur, unspecified: Secondary | ICD-10-CM | POA: Diagnosis not present

## 2022-01-11 DIAGNOSIS — Z1331 Encounter for screening for depression: Secondary | ICD-10-CM | POA: Diagnosis not present

## 2022-01-11 DIAGNOSIS — E46 Unspecified protein-calorie malnutrition: Secondary | ICD-10-CM | POA: Diagnosis not present

## 2022-01-11 DIAGNOSIS — R4189 Other symptoms and signs involving cognitive functions and awareness: Secondary | ICD-10-CM | POA: Diagnosis not present

## 2022-01-11 DIAGNOSIS — Z741 Need for assistance with personal care: Secondary | ICD-10-CM | POA: Diagnosis not present

## 2022-01-11 DIAGNOSIS — R41841 Cognitive communication deficit: Secondary | ICD-10-CM | POA: Diagnosis not present

## 2022-01-11 DIAGNOSIS — R627 Adult failure to thrive: Secondary | ICD-10-CM | POA: Diagnosis not present

## 2022-01-11 DIAGNOSIS — M6259 Muscle wasting and atrophy, not elsewhere classified, multiple sites: Secondary | ICD-10-CM | POA: Diagnosis not present

## 2022-01-11 DIAGNOSIS — M25551 Pain in right hip: Secondary | ICD-10-CM | POA: Diagnosis not present

## 2022-01-11 DIAGNOSIS — F419 Anxiety disorder, unspecified: Secondary | ICD-10-CM | POA: Diagnosis not present

## 2022-01-11 DIAGNOSIS — R5381 Other malaise: Secondary | ICD-10-CM | POA: Diagnosis not present

## 2022-01-11 DIAGNOSIS — B9620 Unspecified Escherichia coli [E. coli] as the cause of diseases classified elsewhere: Secondary | ICD-10-CM

## 2022-01-11 DIAGNOSIS — Z7401 Bed confinement status: Secondary | ICD-10-CM | POA: Diagnosis not present

## 2022-01-11 DIAGNOSIS — R1312 Dysphagia, oropharyngeal phase: Secondary | ICD-10-CM | POA: Diagnosis not present

## 2022-01-11 DIAGNOSIS — R2689 Other abnormalities of gait and mobility: Secondary | ICD-10-CM | POA: Diagnosis not present

## 2022-01-11 DIAGNOSIS — M6281 Muscle weakness (generalized): Secondary | ICD-10-CM | POA: Diagnosis not present

## 2022-01-11 DIAGNOSIS — Z7189 Other specified counseling: Secondary | ICD-10-CM | POA: Diagnosis not present

## 2022-01-11 DIAGNOSIS — R1310 Dysphagia, unspecified: Secondary | ICD-10-CM | POA: Diagnosis not present

## 2022-01-11 DIAGNOSIS — N3941 Urge incontinence: Secondary | ICD-10-CM | POA: Diagnosis not present

## 2022-01-11 DIAGNOSIS — R2681 Unsteadiness on feet: Secondary | ICD-10-CM | POA: Diagnosis not present

## 2022-01-11 DIAGNOSIS — M6250 Muscle wasting and atrophy, not elsewhere classified, unspecified site: Secondary | ICD-10-CM | POA: Diagnosis not present

## 2022-01-11 DIAGNOSIS — R531 Weakness: Secondary | ICD-10-CM | POA: Diagnosis not present

## 2022-01-11 LAB — CULTURE, BLOOD (ROUTINE X 2)
Culture: NO GROWTH
Special Requests: ADEQUATE

## 2022-01-11 MED ORDER — HYDROCERIN EX CREA
TOPICAL_CREAM | Freq: Every day | CUTANEOUS | Status: DC
  Filled 2022-01-11: qty 113

## 2022-01-11 NOTE — Progress Notes (Signed)
Patient was taken to [facility] by [facility]. AVS was placed in packet, and the patient was stable for discharge. Report was given to the RN at the facility prior to patient leaving.

## 2022-01-11 NOTE — TOC Transition Note (Addendum)
Transition of Care [facility]) - CM/SW Discharge Note   Patient Details  Name: Sylvia Petersen MRN: [id number] Date of Birth: Jul 27, 1929  [facility] CM/SW Contact:  Vassie Moselle, LCSW Phone Number: 01/11/2022, 10:48 AM   Clinical Narrative:    Pt's insurance has been approved for SNF placement following peer to peer review being completed. Insurance Josem Kaufmann [id number]. Pt [facility] be going to [facility] room 603P. RN to call report to [phone]. Pt to be transported via [facility]. [facility] has been arranged at 10;50am and report they should arrive to pick pt up within the hour. RN notified.   CSW met with pt and confirmed plan to transfer to SNF. Pt is in agreeance with discharge plan. CSW attempted to contact pt's son [name] however, phone number is currently not allowing calls to be made. CSW attempted to contact pt's other son, Legrand Como and left a voicemail requesting a return call.   Update 11:05am- CSW spoke with pt's son Legrand Como and confirmed discharge plans. All questions answered.    Final next level of care: Skilled Nursing Facility Barriers to Discharge: No Barriers Identified   Patient Goals and CMS Choice Patient states their goals for this hospitalization and ongoing recovery are:: To go to SNF CMS Medicare.gov Compare Post Acute Care list provided to:: Patient Represenative (must comment) (Son) Choice offered to / list presented to : Adult Children  Discharge Placement   Existing PASRR number confirmed : 01/08/22          Patient chooses bed at: [facility] Patient to be transferred to facility by: [place] Name of family member notified: Unable to reach family members. Pt in agreeance to dsicharge plans Patient and family notified of of transfer: 01/11/22  Discharge Plan and Services In-house Referral: NA Discharge Planning Services: CM Consult Post Acute Care Choice: [place]          DME Arranged: N/A DME Agency: NA                  Social  Determinants of Health (SDOH) Interventions     Readmission Risk Interventions    01/11/2022   10:34 AM 01/08/2022   12:39 PM  Readmission Risk Prevention Plan  Post Dischage Appt Complete Complete  Medication Screening Complete Complete  Transportation Screening Complete Complete

## 2022-01-11 NOTE — Progress Notes (Signed)
Spoke w/ Dr Amalia Hailey he has approved SNF but pt and family needs more hours and help at home moving forward possibly 16-18 hrs of care of ALF- she does not want to go to ALF but agrees for more care at home- unable to reach son when tried calling both of them.    Discharge summary reviewed and agree Complete po antibiotics  Cont rest of her medications. Discharge date 01/11/2022 Refer to dc summary done 01/09/2022

## 2022-01-16 DIAGNOSIS — Z1331 Encounter for screening for depression: Secondary | ICD-10-CM | POA: Diagnosis not present

## 2022-01-16 DIAGNOSIS — I10 Essential (primary) hypertension: Secondary | ICD-10-CM | POA: Diagnosis not present

## 2022-01-18 DIAGNOSIS — R4189 Other symptoms and signs involving cognitive functions and awareness: Secondary | ICD-10-CM | POA: Diagnosis not present

## 2022-01-18 DIAGNOSIS — E860 Dehydration: Secondary | ICD-10-CM | POA: Diagnosis not present

## 2022-01-18 DIAGNOSIS — M6250 Muscle wasting and atrophy, not elsewhere classified, unspecified site: Secondary | ICD-10-CM | POA: Diagnosis not present

## 2022-01-18 DIAGNOSIS — G9341 Metabolic encephalopathy: Secondary | ICD-10-CM | POA: Diagnosis not present

## 2022-01-18 DIAGNOSIS — E46 Unspecified protein-calorie malnutrition: Secondary | ICD-10-CM | POA: Diagnosis not present

## 2022-01-18 DIAGNOSIS — R41841 Cognitive communication deficit: Secondary | ICD-10-CM | POA: Diagnosis not present

## 2022-01-18 DIAGNOSIS — R5381 Other malaise: Secondary | ICD-10-CM | POA: Diagnosis not present

## 2022-01-18 DIAGNOSIS — R2681 Unsteadiness on feet: Secondary | ICD-10-CM | POA: Diagnosis not present

## 2022-01-18 DIAGNOSIS — R011 Cardiac murmur, unspecified: Secondary | ICD-10-CM | POA: Diagnosis not present

## 2022-01-18 DIAGNOSIS — R627 Adult failure to thrive: Secondary | ICD-10-CM | POA: Diagnosis not present

## 2022-01-18 DIAGNOSIS — N390 Urinary tract infection, site not specified: Secondary | ICD-10-CM | POA: Diagnosis not present

## 2022-01-19 DIAGNOSIS — N183 Chronic kidney disease, stage 3 unspecified: Secondary | ICD-10-CM | POA: Diagnosis not present

## 2022-01-19 DIAGNOSIS — D638 Anemia in other chronic diseases classified elsewhere: Secondary | ICD-10-CM | POA: Diagnosis not present

## 2022-01-19 DIAGNOSIS — F32A Depression, unspecified: Secondary | ICD-10-CM | POA: Diagnosis not present

## 2022-01-19 DIAGNOSIS — F419 Anxiety disorder, unspecified: Secondary | ICD-10-CM | POA: Diagnosis not present

## 2022-01-22 DIAGNOSIS — F419 Anxiety disorder, unspecified: Secondary | ICD-10-CM | POA: Diagnosis not present

## 2022-01-22 DIAGNOSIS — F32A Depression, unspecified: Secondary | ICD-10-CM | POA: Diagnosis not present

## 2022-01-22 DIAGNOSIS — N183 Chronic kidney disease, stage 3 unspecified: Secondary | ICD-10-CM | POA: Diagnosis not present

## 2022-01-22 DIAGNOSIS — N3941 Urge incontinence: Secondary | ICD-10-CM | POA: Diagnosis not present

## 2022-01-23 DIAGNOSIS — R2681 Unsteadiness on feet: Secondary | ICD-10-CM | POA: Diagnosis not present

## 2022-01-23 DIAGNOSIS — R41841 Cognitive communication deficit: Secondary | ICD-10-CM | POA: Diagnosis not present

## 2022-01-23 DIAGNOSIS — G9341 Metabolic encephalopathy: Secondary | ICD-10-CM | POA: Diagnosis not present

## 2022-01-23 DIAGNOSIS — M6250 Muscle wasting and atrophy, not elsewhere classified, unspecified site: Secondary | ICD-10-CM | POA: Diagnosis not present

## 2022-01-23 DIAGNOSIS — N390 Urinary tract infection, site not specified: Secondary | ICD-10-CM | POA: Diagnosis not present

## 2022-01-23 DIAGNOSIS — E46 Unspecified protein-calorie malnutrition: Secondary | ICD-10-CM | POA: Diagnosis not present

## 2022-01-23 DIAGNOSIS — E860 Dehydration: Secondary | ICD-10-CM | POA: Diagnosis not present

## 2022-01-23 DIAGNOSIS — R5381 Other malaise: Secondary | ICD-10-CM | POA: Diagnosis not present

## 2022-01-23 DIAGNOSIS — R627 Adult failure to thrive: Secondary | ICD-10-CM | POA: Diagnosis not present

## 2022-01-25 DIAGNOSIS — N390 Urinary tract infection, site not specified: Secondary | ICD-10-CM | POA: Diagnosis not present

## 2022-01-25 DIAGNOSIS — R2681 Unsteadiness on feet: Secondary | ICD-10-CM | POA: Diagnosis not present

## 2022-01-25 DIAGNOSIS — E46 Unspecified protein-calorie malnutrition: Secondary | ICD-10-CM | POA: Diagnosis not present

## 2022-01-25 DIAGNOSIS — G9341 Metabolic encephalopathy: Secondary | ICD-10-CM | POA: Diagnosis not present

## 2022-01-25 DIAGNOSIS — R41841 Cognitive communication deficit: Secondary | ICD-10-CM | POA: Diagnosis not present

## 2022-01-25 DIAGNOSIS — R627 Adult failure to thrive: Secondary | ICD-10-CM | POA: Diagnosis not present

## 2022-01-25 DIAGNOSIS — E860 Dehydration: Secondary | ICD-10-CM | POA: Diagnosis not present

## 2022-01-25 DIAGNOSIS — M6250 Muscle wasting and atrophy, not elsewhere classified, unspecified site: Secondary | ICD-10-CM | POA: Diagnosis not present

## 2022-01-25 DIAGNOSIS — R5381 Other malaise: Secondary | ICD-10-CM | POA: Diagnosis not present

## 2022-01-26 DIAGNOSIS — Z9189 Other specified personal risk factors, not elsewhere classified: Secondary | ICD-10-CM | POA: Diagnosis not present

## 2022-01-26 DIAGNOSIS — N390 Urinary tract infection, site not specified: Secondary | ICD-10-CM | POA: Diagnosis not present

## 2022-01-26 DIAGNOSIS — D66 Hereditary factor VIII deficiency: Secondary | ICD-10-CM | POA: Diagnosis not present

## 2022-01-26 DIAGNOSIS — Z7189 Other specified counseling: Secondary | ICD-10-CM | POA: Diagnosis not present

## 2022-01-29 DIAGNOSIS — R627 Adult failure to thrive: Secondary | ICD-10-CM | POA: Diagnosis not present

## 2022-01-29 DIAGNOSIS — R41841 Cognitive communication deficit: Secondary | ICD-10-CM | POA: Diagnosis not present

## 2022-01-29 DIAGNOSIS — N390 Urinary tract infection, site not specified: Secondary | ICD-10-CM | POA: Diagnosis not present

## 2022-01-29 DIAGNOSIS — E46 Unspecified protein-calorie malnutrition: Secondary | ICD-10-CM | POA: Diagnosis not present

## 2022-01-29 DIAGNOSIS — R5381 Other malaise: Secondary | ICD-10-CM | POA: Diagnosis not present

## 2022-01-29 DIAGNOSIS — M6250 Muscle wasting and atrophy, not elsewhere classified, unspecified site: Secondary | ICD-10-CM | POA: Diagnosis not present

## 2022-01-29 DIAGNOSIS — R2681 Unsteadiness on feet: Secondary | ICD-10-CM | POA: Diagnosis not present

## 2022-01-29 DIAGNOSIS — E860 Dehydration: Secondary | ICD-10-CM | POA: Diagnosis not present

## 2022-01-29 DIAGNOSIS — G9341 Metabolic encephalopathy: Secondary | ICD-10-CM | POA: Diagnosis not present

## 2022-01-31 DIAGNOSIS — E46 Unspecified protein-calorie malnutrition: Secondary | ICD-10-CM | POA: Diagnosis not present

## 2022-01-31 DIAGNOSIS — R627 Adult failure to thrive: Secondary | ICD-10-CM | POA: Diagnosis not present

## 2022-01-31 DIAGNOSIS — R2681 Unsteadiness on feet: Secondary | ICD-10-CM | POA: Diagnosis not present

## 2022-01-31 DIAGNOSIS — G9341 Metabolic encephalopathy: Secondary | ICD-10-CM | POA: Diagnosis not present

## 2022-01-31 DIAGNOSIS — R41841 Cognitive communication deficit: Secondary | ICD-10-CM | POA: Diagnosis not present

## 2022-01-31 DIAGNOSIS — N390 Urinary tract infection, site not specified: Secondary | ICD-10-CM | POA: Diagnosis not present

## 2022-01-31 DIAGNOSIS — M6250 Muscle wasting and atrophy, not elsewhere classified, unspecified site: Secondary | ICD-10-CM | POA: Diagnosis not present

## 2022-01-31 DIAGNOSIS — E860 Dehydration: Secondary | ICD-10-CM | POA: Diagnosis not present

## 2022-01-31 DIAGNOSIS — R5381 Other malaise: Secondary | ICD-10-CM | POA: Diagnosis not present

## 2022-02-05 DIAGNOSIS — G9341 Metabolic encephalopathy: Secondary | ICD-10-CM | POA: Diagnosis not present

## 2022-02-05 DIAGNOSIS — N390 Urinary tract infection, site not specified: Secondary | ICD-10-CM | POA: Diagnosis not present

## 2022-02-05 DIAGNOSIS — E860 Dehydration: Secondary | ICD-10-CM | POA: Diagnosis not present

## 2022-02-05 DIAGNOSIS — R41841 Cognitive communication deficit: Secondary | ICD-10-CM | POA: Diagnosis not present

## 2022-02-05 DIAGNOSIS — R2681 Unsteadiness on feet: Secondary | ICD-10-CM | POA: Diagnosis not present

## 2022-02-05 DIAGNOSIS — R5381 Other malaise: Secondary | ICD-10-CM | POA: Diagnosis not present

## 2022-02-05 DIAGNOSIS — E46 Unspecified protein-calorie malnutrition: Secondary | ICD-10-CM | POA: Diagnosis not present

## 2022-02-05 DIAGNOSIS — M6250 Muscle wasting and atrophy, not elsewhere classified, unspecified site: Secondary | ICD-10-CM | POA: Diagnosis not present

## 2022-02-05 DIAGNOSIS — R627 Adult failure to thrive: Secondary | ICD-10-CM | POA: Diagnosis not present

## 2022-02-07 DIAGNOSIS — G9341 Metabolic encephalopathy: Secondary | ICD-10-CM | POA: Diagnosis not present

## 2022-02-07 DIAGNOSIS — E860 Dehydration: Secondary | ICD-10-CM | POA: Diagnosis not present

## 2022-02-07 DIAGNOSIS — R2681 Unsteadiness on feet: Secondary | ICD-10-CM | POA: Diagnosis not present

## 2022-02-07 DIAGNOSIS — E46 Unspecified protein-calorie malnutrition: Secondary | ICD-10-CM | POA: Diagnosis not present

## 2022-02-07 DIAGNOSIS — R41841 Cognitive communication deficit: Secondary | ICD-10-CM | POA: Diagnosis not present

## 2022-02-07 DIAGNOSIS — N390 Urinary tract infection, site not specified: Secondary | ICD-10-CM | POA: Diagnosis not present

## 2022-02-07 DIAGNOSIS — R5381 Other malaise: Secondary | ICD-10-CM | POA: Diagnosis not present

## 2022-02-07 DIAGNOSIS — M6250 Muscle wasting and atrophy, not elsewhere classified, unspecified site: Secondary | ICD-10-CM | POA: Diagnosis not present

## 2022-02-07 DIAGNOSIS — R627 Adult failure to thrive: Secondary | ICD-10-CM | POA: Diagnosis not present

## 2022-02-12 DIAGNOSIS — Z8669 Personal history of other diseases of the nervous system and sense organs: Secondary | ICD-10-CM | POA: Diagnosis not present

## 2022-02-12 DIAGNOSIS — R2681 Unsteadiness on feet: Secondary | ICD-10-CM | POA: Diagnosis not present

## 2022-02-12 DIAGNOSIS — G9341 Metabolic encephalopathy: Secondary | ICD-10-CM | POA: Diagnosis not present

## 2022-02-12 DIAGNOSIS — M25551 Pain in right hip: Secondary | ICD-10-CM | POA: Diagnosis not present

## 2022-02-12 DIAGNOSIS — M25552 Pain in left hip: Secondary | ICD-10-CM | POA: Diagnosis not present

## 2022-02-12 DIAGNOSIS — M6250 Muscle wasting and atrophy, not elsewhere classified, unspecified site: Secondary | ICD-10-CM | POA: Diagnosis not present

## 2022-02-12 DIAGNOSIS — R41841 Cognitive communication deficit: Secondary | ICD-10-CM | POA: Diagnosis not present

## 2022-02-12 DIAGNOSIS — F32A Depression, unspecified: Secondary | ICD-10-CM | POA: Diagnosis not present

## 2022-02-12 DIAGNOSIS — N390 Urinary tract infection, site not specified: Secondary | ICD-10-CM | POA: Diagnosis not present

## 2022-02-12 DIAGNOSIS — E860 Dehydration: Secondary | ICD-10-CM | POA: Diagnosis not present

## 2022-02-12 DIAGNOSIS — R627 Adult failure to thrive: Secondary | ICD-10-CM | POA: Diagnosis not present

## 2022-02-12 DIAGNOSIS — R5381 Other malaise: Secondary | ICD-10-CM | POA: Diagnosis not present

## 2022-02-12 DIAGNOSIS — E46 Unspecified protein-calorie malnutrition: Secondary | ICD-10-CM | POA: Diagnosis not present

## 2022-02-14 DIAGNOSIS — G9341 Metabolic encephalopathy: Secondary | ICD-10-CM | POA: Diagnosis not present

## 2022-02-14 DIAGNOSIS — R5381 Other malaise: Secondary | ICD-10-CM | POA: Diagnosis not present

## 2022-02-14 DIAGNOSIS — R627 Adult failure to thrive: Secondary | ICD-10-CM | POA: Diagnosis not present

## 2022-02-14 DIAGNOSIS — M6250 Muscle wasting and atrophy, not elsewhere classified, unspecified site: Secondary | ICD-10-CM | POA: Diagnosis not present

## 2022-02-14 DIAGNOSIS — R2681 Unsteadiness on feet: Secondary | ICD-10-CM | POA: Diagnosis not present

## 2022-02-14 DIAGNOSIS — E46 Unspecified protein-calorie malnutrition: Secondary | ICD-10-CM | POA: Diagnosis not present

## 2022-02-14 DIAGNOSIS — E860 Dehydration: Secondary | ICD-10-CM | POA: Diagnosis not present

## 2022-02-14 DIAGNOSIS — N390 Urinary tract infection, site not specified: Secondary | ICD-10-CM | POA: Diagnosis not present

## 2022-02-14 DIAGNOSIS — R41841 Cognitive communication deficit: Secondary | ICD-10-CM | POA: Diagnosis not present

## 2022-02-15 DIAGNOSIS — M6259 Muscle wasting and atrophy, not elsewhere classified, multiple sites: Secondary | ICD-10-CM | POA: Diagnosis not present

## 2022-02-15 DIAGNOSIS — R2689 Other abnormalities of gait and mobility: Secondary | ICD-10-CM | POA: Diagnosis not present

## 2022-02-15 DIAGNOSIS — N390 Urinary tract infection, site not specified: Secondary | ICD-10-CM | POA: Diagnosis not present

## 2022-02-15 DIAGNOSIS — M1380 Other specified arthritis, unspecified site: Secondary | ICD-10-CM | POA: Diagnosis not present

## 2022-02-15 DIAGNOSIS — B9620 Unspecified Escherichia coli [E. coli] as the cause of diseases classified elsewhere: Secondary | ICD-10-CM | POA: Diagnosis not present

## 2022-02-15 DIAGNOSIS — Z741 Need for assistance with personal care: Secondary | ICD-10-CM | POA: Diagnosis not present

## 2022-02-16 DIAGNOSIS — F419 Anxiety disorder, unspecified: Secondary | ICD-10-CM | POA: Diagnosis not present

## 2022-02-16 DIAGNOSIS — M1380 Other specified arthritis, unspecified site: Secondary | ICD-10-CM | POA: Diagnosis not present

## 2022-02-16 DIAGNOSIS — I10 Essential (primary) hypertension: Secondary | ICD-10-CM | POA: Diagnosis not present

## 2022-02-16 DIAGNOSIS — D638 Anemia in other chronic diseases classified elsewhere: Secondary | ICD-10-CM | POA: Diagnosis not present

## 2022-02-16 DIAGNOSIS — R2689 Other abnormalities of gait and mobility: Secondary | ICD-10-CM | POA: Diagnosis not present

## 2022-02-16 DIAGNOSIS — N390 Urinary tract infection, site not specified: Secondary | ICD-10-CM | POA: Diagnosis not present

## 2022-02-16 DIAGNOSIS — B9620 Unspecified Escherichia coli [E. coli] as the cause of diseases classified elsewhere: Secondary | ICD-10-CM | POA: Diagnosis not present

## 2022-02-16 DIAGNOSIS — M6259 Muscle wasting and atrophy, not elsewhere classified, multiple sites: Secondary | ICD-10-CM | POA: Diagnosis not present

## 2022-02-16 DIAGNOSIS — M25551 Pain in right hip: Secondary | ICD-10-CM | POA: Diagnosis not present

## 2022-02-16 DIAGNOSIS — Z741 Need for assistance with personal care: Secondary | ICD-10-CM | POA: Diagnosis not present

## 2022-02-21 DIAGNOSIS — M6259 Muscle wasting and atrophy, not elsewhere classified, multiple sites: Secondary | ICD-10-CM | POA: Diagnosis not present

## 2022-02-21 DIAGNOSIS — R1312 Dysphagia, oropharyngeal phase: Secondary | ICD-10-CM | POA: Diagnosis not present

## 2022-02-21 DIAGNOSIS — R2681 Unsteadiness on feet: Secondary | ICD-10-CM | POA: Diagnosis not present

## 2022-02-21 DIAGNOSIS — Z9181 History of falling: Secondary | ICD-10-CM | POA: Diagnosis not present

## 2022-02-21 DIAGNOSIS — D649 Anemia, unspecified: Secondary | ICD-10-CM | POA: Diagnosis not present

## 2022-02-21 DIAGNOSIS — E785 Hyperlipidemia, unspecified: Secondary | ICD-10-CM | POA: Diagnosis not present

## 2022-02-21 DIAGNOSIS — R41841 Cognitive communication deficit: Secondary | ICD-10-CM | POA: Diagnosis not present

## 2022-02-21 DIAGNOSIS — R2689 Other abnormalities of gait and mobility: Secondary | ICD-10-CM | POA: Diagnosis not present

## 2022-02-21 DIAGNOSIS — B9620 Unspecified Escherichia coli [E. coli] as the cause of diseases classified elsewhere: Secondary | ICD-10-CM | POA: Diagnosis not present

## 2022-02-21 DIAGNOSIS — N390 Urinary tract infection, site not specified: Secondary | ICD-10-CM | POA: Diagnosis not present

## 2022-02-21 DIAGNOSIS — I10 Essential (primary) hypertension: Secondary | ICD-10-CM | POA: Diagnosis not present

## 2022-02-21 DIAGNOSIS — R1310 Dysphagia, unspecified: Secondary | ICD-10-CM | POA: Diagnosis not present

## 2022-02-21 DIAGNOSIS — M6281 Muscle weakness (generalized): Secondary | ICD-10-CM | POA: Diagnosis not present

## 2022-03-13 DIAGNOSIS — R1310 Dysphagia, unspecified: Secondary | ICD-10-CM | POA: Diagnosis not present

## 2022-03-13 DIAGNOSIS — E785 Hyperlipidemia, unspecified: Secondary | ICD-10-CM | POA: Diagnosis not present

## 2022-03-13 DIAGNOSIS — D649 Anemia, unspecified: Secondary | ICD-10-CM | POA: Diagnosis not present

## 2022-03-13 DIAGNOSIS — B9620 Unspecified Escherichia coli [E. coli] as the cause of diseases classified elsewhere: Secondary | ICD-10-CM | POA: Diagnosis not present

## 2022-03-13 DIAGNOSIS — M6281 Muscle weakness (generalized): Secondary | ICD-10-CM | POA: Diagnosis not present

## 2022-03-13 DIAGNOSIS — N390 Urinary tract infection, site not specified: Secondary | ICD-10-CM | POA: Diagnosis not present

## 2022-03-13 DIAGNOSIS — I10 Essential (primary) hypertension: Secondary | ICD-10-CM | POA: Diagnosis not present

## 2022-03-13 DIAGNOSIS — R1312 Dysphagia, oropharyngeal phase: Secondary | ICD-10-CM | POA: Diagnosis not present

## 2022-03-13 DIAGNOSIS — R41841 Cognitive communication deficit: Secondary | ICD-10-CM | POA: Diagnosis not present

## 2022-03-13 DIAGNOSIS — R2689 Other abnormalities of gait and mobility: Secondary | ICD-10-CM | POA: Diagnosis not present

## 2022-03-20 DIAGNOSIS — R29898 Other symptoms and signs involving the musculoskeletal system: Secondary | ICD-10-CM | POA: Diagnosis not present

## 2022-03-20 DIAGNOSIS — N390 Urinary tract infection, site not specified: Secondary | ICD-10-CM | POA: Diagnosis not present

## 2022-03-20 DIAGNOSIS — Z742 Need for assistance at home and no other household member able to render care: Secondary | ICD-10-CM | POA: Diagnosis not present

## 2022-03-20 DIAGNOSIS — R5381 Other malaise: Secondary | ICD-10-CM | POA: Diagnosis not present

## 2022-03-20 DIAGNOSIS — R32 Unspecified urinary incontinence: Secondary | ICD-10-CM | POA: Diagnosis not present

## 2022-03-27 DIAGNOSIS — R29898 Other symptoms and signs involving the musculoskeletal system: Secondary | ICD-10-CM | POA: Diagnosis not present

## 2022-03-27 DIAGNOSIS — K219 Gastro-esophageal reflux disease without esophagitis: Secondary | ICD-10-CM | POA: Diagnosis not present

## 2022-03-27 DIAGNOSIS — Z741 Need for assistance with personal care: Secondary | ICD-10-CM | POA: Diagnosis not present

## 2022-04-12 DIAGNOSIS — M1380 Other specified arthritis, unspecified site: Secondary | ICD-10-CM | POA: Diagnosis not present

## 2022-04-12 DIAGNOSIS — M6281 Muscle weakness (generalized): Secondary | ICD-10-CM | POA: Diagnosis not present

## 2022-04-12 DIAGNOSIS — R1310 Dysphagia, unspecified: Secondary | ICD-10-CM | POA: Diagnosis not present

## 2022-04-12 DIAGNOSIS — R41841 Cognitive communication deficit: Secondary | ICD-10-CM | POA: Diagnosis not present

## 2022-04-12 DIAGNOSIS — D649 Anemia, unspecified: Secondary | ICD-10-CM | POA: Diagnosis not present

## 2022-04-12 DIAGNOSIS — R2689 Other abnormalities of gait and mobility: Secondary | ICD-10-CM | POA: Diagnosis not present

## 2022-04-12 DIAGNOSIS — I10 Essential (primary) hypertension: Secondary | ICD-10-CM | POA: Diagnosis not present

## 2022-04-12 DIAGNOSIS — R2681 Unsteadiness on feet: Secondary | ICD-10-CM | POA: Diagnosis not present

## 2022-04-12 DIAGNOSIS — E785 Hyperlipidemia, unspecified: Secondary | ICD-10-CM | POA: Diagnosis not present

## 2022-04-12 DIAGNOSIS — M6259 Muscle wasting and atrophy, not elsewhere classified, multiple sites: Secondary | ICD-10-CM | POA: Diagnosis not present

## 2022-04-18 DIAGNOSIS — R399 Unspecified symptoms and signs involving the genitourinary system: Secondary | ICD-10-CM | POA: Diagnosis not present

## 2022-04-22 DIAGNOSIS — M6259 Muscle wasting and atrophy, not elsewhere classified, multiple sites: Secondary | ICD-10-CM | POA: Diagnosis not present

## 2022-04-22 DIAGNOSIS — D649 Anemia, unspecified: Secondary | ICD-10-CM | POA: Diagnosis not present

## 2022-04-22 DIAGNOSIS — R1312 Dysphagia, oropharyngeal phase: Secondary | ICD-10-CM | POA: Diagnosis not present

## 2022-04-22 DIAGNOSIS — Z9181 History of falling: Secondary | ICD-10-CM | POA: Diagnosis not present

## 2022-04-22 DIAGNOSIS — I10 Essential (primary) hypertension: Secondary | ICD-10-CM | POA: Diagnosis not present

## 2022-04-22 DIAGNOSIS — R1310 Dysphagia, unspecified: Secondary | ICD-10-CM | POA: Diagnosis not present

## 2022-04-22 DIAGNOSIS — M6281 Muscle weakness (generalized): Secondary | ICD-10-CM | POA: Diagnosis not present

## 2022-04-22 DIAGNOSIS — R2689 Other abnormalities of gait and mobility: Secondary | ICD-10-CM | POA: Diagnosis not present

## 2022-04-22 DIAGNOSIS — R2681 Unsteadiness on feet: Secondary | ICD-10-CM | POA: Diagnosis not present

## 2022-04-22 DIAGNOSIS — M1380 Other specified arthritis, unspecified site: Secondary | ICD-10-CM | POA: Diagnosis not present

## 2022-04-22 DIAGNOSIS — E785 Hyperlipidemia, unspecified: Secondary | ICD-10-CM | POA: Diagnosis not present

## 2022-04-22 DIAGNOSIS — R41841 Cognitive communication deficit: Secondary | ICD-10-CM | POA: Diagnosis not present

## 2022-04-26 DIAGNOSIS — L89620 Pressure ulcer of left heel, unstageable: Secondary | ICD-10-CM | POA: Diagnosis not present

## 2022-07-14 ENCOUNTER — Encounter (HOSPITAL_COMMUNITY): Payer: Self-pay

## 2022-07-14 ENCOUNTER — Emergency Department (HOSPITAL_COMMUNITY): Payer: PPO

## 2022-07-14 ENCOUNTER — Other Ambulatory Visit: Payer: Self-pay

## 2022-07-14 ENCOUNTER — Inpatient Hospital Stay (HOSPITAL_COMMUNITY)
Admission: EM | Admit: 2022-07-14 | Discharge: 2022-07-17 | DRG: 178 | Disposition: A | Discharge status: Home / Self Care | Payer: PPO | Attending: Internal Medicine | Admitting: Internal Medicine

## 2022-07-14 DIAGNOSIS — E7800 Pure hypercholesterolemia, unspecified: Secondary | ICD-10-CM | POA: Diagnosis present

## 2022-07-14 DIAGNOSIS — R918 Other nonspecific abnormal finding of lung field: Secondary | ICD-10-CM | POA: Diagnosis not present

## 2022-07-14 DIAGNOSIS — R509 Fever, unspecified: Secondary | ICD-10-CM | POA: Diagnosis not present

## 2022-07-14 DIAGNOSIS — J189 Pneumonia, unspecified organism: Secondary | ICD-10-CM

## 2022-07-14 DIAGNOSIS — L97421 Non-pressure chronic ulcer of left heel and midfoot limited to breakdown of skin: Secondary | ICD-10-CM

## 2022-07-14 DIAGNOSIS — J690 Pneumonitis due to inhalation of food and vomit: Secondary | ICD-10-CM | POA: Diagnosis not present

## 2022-07-14 DIAGNOSIS — R54 Age-related physical debility: Secondary | ICD-10-CM | POA: Diagnosis not present

## 2022-07-14 DIAGNOSIS — K219 Gastro-esophageal reflux disease without esophagitis: Secondary | ICD-10-CM | POA: Diagnosis not present

## 2022-07-14 DIAGNOSIS — Z79899 Other long term (current) drug therapy: Secondary | ICD-10-CM

## 2022-07-14 DIAGNOSIS — I10 Essential (primary) hypertension: Secondary | ICD-10-CM | POA: Diagnosis not present

## 2022-07-14 DIAGNOSIS — E860 Dehydration: Secondary | ICD-10-CM | POA: Diagnosis not present

## 2022-07-14 DIAGNOSIS — R404 Transient alteration of awareness: Secondary | ICD-10-CM | POA: Diagnosis not present

## 2022-07-14 DIAGNOSIS — R64 Cachexia: Secondary | ICD-10-CM | POA: Diagnosis not present

## 2022-07-14 DIAGNOSIS — R627 Adult failure to thrive: Secondary | ICD-10-CM | POA: Diagnosis not present

## 2022-07-14 DIAGNOSIS — L89620 Pressure ulcer of left heel, unstageable: Secondary | ICD-10-CM | POA: Diagnosis present

## 2022-07-14 DIAGNOSIS — N390 Urinary tract infection, site not specified: Secondary | ICD-10-CM | POA: Diagnosis present

## 2022-07-14 DIAGNOSIS — Z66 Do not resuscitate: Secondary | ICD-10-CM | POA: Diagnosis not present

## 2022-07-14 DIAGNOSIS — L89629 Pressure ulcer of left heel, unspecified stage: Secondary | ICD-10-CM | POA: Diagnosis not present

## 2022-07-14 DIAGNOSIS — L97429 Non-pressure chronic ulcer of left heel and midfoot with unspecified severity: Secondary | ICD-10-CM | POA: Diagnosis not present

## 2022-07-14 DIAGNOSIS — J168 Pneumonia due to other specified infectious organisms: Secondary | ICD-10-CM | POA: Diagnosis not present

## 2022-07-14 DIAGNOSIS — M869 Osteomyelitis, unspecified: Secondary | ICD-10-CM | POA: Diagnosis present

## 2022-07-14 DIAGNOSIS — Z7401 Bed confinement status: Secondary | ICD-10-CM | POA: Diagnosis not present

## 2022-07-14 LAB — CBC WITH DIFFERENTIAL/PLATELET
Abs Immature Granulocytes: 0.02 10*3/uL (ref 0.00–0.07)
Basophils Absolute: 0.1 10*3/uL (ref 0.0–0.1)
Basophils Relative: 1 %
Eosinophils Absolute: 0.2 10*3/uL (ref 0.0–0.5)
Eosinophils Relative: 1 %
HCT: 32.2 % — ABNORMAL LOW (ref 36.0–46.0)
Hemoglobin: 9.7 g/dL — ABNORMAL LOW (ref 12.0–15.0)
Immature Granulocytes: 0 %
Lymphocytes Relative: 17 %
Lymphs Abs: 1.8 10*3/uL (ref 0.7–4.0)
MCH: 29 pg (ref 26.0–34.0)
MCHC: 30.1 g/dL (ref 30.0–36.0)
MCV: 96.1 fL (ref 80.0–100.0)
Monocytes Absolute: 0.6 10*3/uL (ref 0.1–1.0)
Monocytes Relative: 6 %
Neutro Abs: 7.9 10*3/uL — ABNORMAL HIGH (ref 1.7–7.7)
Neutrophils Relative %: 75 %
Platelets: 402 10*3/uL — ABNORMAL HIGH (ref 150–400)
RBC: 3.35 MIL/uL — ABNORMAL LOW (ref 3.87–5.11)
RDW: 13.2 % (ref 11.5–15.5)
WBC: 10.6 10*3/uL — ABNORMAL HIGH (ref 4.0–10.5)
nRBC: 0 % (ref 0.0–0.2)

## 2022-07-14 LAB — URINALYSIS, ROUTINE W REFLEX MICROSCOPIC
Bilirubin Urine: NEGATIVE
Glucose, UA: NEGATIVE mg/dL
Hgb urine dipstick: NEGATIVE
Ketones, ur: NEGATIVE mg/dL
Nitrite: NEGATIVE
Protein, ur: NEGATIVE mg/dL
Specific Gravity, Urine: 1.013 (ref 1.005–1.030)
WBC, UA: >50 WBC/hpf (ref 0–5)
pH: 5 (ref 5.0–8.0)

## 2022-07-14 LAB — RESPIRATORY PANEL BY PCR

## 2022-07-14 LAB — BASIC METABOLIC PANEL
Anion gap: 12 (ref 5–15)
BUN: 49 mg/dL — ABNORMAL HIGH (ref 8–23)
CO2: 20 mmol/L — ABNORMAL LOW (ref 22–32)
Calcium: 9.7 mg/dL (ref 8.9–10.3)
Chloride: 111 mmol/L (ref 98–111)
Creatinine, Ser: 1.02 mg/dL — ABNORMAL HIGH (ref 0.44–1.00)
GFR, Estimated: 52 mL/min — ABNORMAL LOW (ref >60)
Glucose, Bld: 102 mg/dL — ABNORMAL HIGH (ref 70–99)
Potassium: 4.5 mmol/L (ref 3.5–5.1)
Sodium: 143 mmol/L (ref 135–145)

## 2022-07-14 LAB — LACTIC ACID, PLASMA
Lactic Acid, Venous: 1.1 mmol/L (ref 0.5–1.9)
Lactic Acid, Venous: 1.3 mmol/L (ref 0.5–1.9)

## 2022-07-14 LAB — STREP PNEUMONIAE URINARY ANTIGEN: Strep Pneumo Urinary Antigen: NEGATIVE

## 2022-07-14 MED ORDER — SODIUM CHLORIDE 0.9 % IV SOLN
2.0000 g | INTRAVENOUS | Status: DC
  Filled 2022-07-14: qty 20

## 2022-07-14 MED ORDER — SENNOSIDES-DOCUSATE SODIUM 8.6-50 MG PO TABS: 1.0000 | ORAL_TABLET | Freq: Every evening | ORAL | Status: DC | PRN

## 2022-07-14 MED ORDER — SODIUM CHLORIDE 0.9 % IV SOLN
1.0000 g | INTRAVENOUS | Status: DC
  Administered 2022-07-14: 1 g via INTRAVENOUS
  Filled 2022-07-14: qty 10

## 2022-07-14 MED ORDER — SODIUM CHLORIDE 0.9 % IV SOLN: 500.0000 mg | INTRAVENOUS | Status: DC

## 2022-07-14 MED ORDER — ACETAMINOPHEN 650 MG RE SUPP: 650.0000 mg | Freq: Four times a day (QID) | RECTAL | Status: DC | PRN

## 2022-07-14 MED ORDER — VANCOMYCIN HCL 750 MG/150ML IV SOLN: 750.0000 mg | INTRAVENOUS | Status: DC

## 2022-07-14 MED ORDER — SODIUM CHLORIDE 0.9 % IV SOLN: INTRAVENOUS | Status: DC

## 2022-07-14 MED ORDER — ENOXAPARIN SODIUM 40 MG/0.4ML IJ SOSY
40.0000 mg | PREFILLED_SYRINGE | INTRAMUSCULAR | Status: DC
  Administered 2022-07-14 – 2022-07-16 (×3): 40 mg via SUBCUTANEOUS
  Filled 2022-07-14 (×3): qty 0.4

## 2022-07-14 MED ORDER — SODIUM CHLORIDE 0.9 % IV SOLN
500.0000 mg | INTRAVENOUS | Status: DC
  Administered 2022-07-14: 500 mg via INTRAVENOUS
  Filled 2022-07-14: qty 5

## 2022-07-14 MED ORDER — ACETAMINOPHEN 325 MG PO TABS: 650.0000 mg | ORAL_TABLET | Freq: Four times a day (QID) | ORAL | Status: DC | PRN

## 2022-07-14 MED ORDER — METOPROLOL TARTRATE 50 MG PO TABS
50.0000 mg | ORAL_TABLET | Freq: Two times a day (BID) | ORAL | Status: DC
  Administered 2022-07-14 – 2022-07-17 (×6): 50 mg via ORAL
  Filled 2022-07-14 (×6): qty 1

## 2022-07-14 MED ORDER — SODIUM CHLORIDE 0.9 % IV SOLN: 2.0000 g | INTRAVENOUS | Status: DC

## 2022-07-14 MED ORDER — SODIUM CHLORIDE 0.9 % IV SOLN
1.0000 g | Freq: Once | INTRAVENOUS | Status: AC
  Administered 2022-07-14: 1 g via INTRAVENOUS
  Filled 2022-07-14: qty 10

## 2022-07-14 MED ORDER — VANCOMYCIN HCL IN DEXTROSE 1-5 GM/200ML-% IV SOLN
1000.0000 mg | Freq: Once | INTRAVENOUS | Status: AC
  Administered 2022-07-14: 1000 mg via INTRAVENOUS
  Filled 2022-07-14: qty 200

## 2022-07-14 MED ORDER — POLYVINYL ALCOHOL 1.4 % OP SOLN: 1.0000 [drp] | Freq: Every day | OPHTHALMIC | Status: DC | PRN

## 2022-07-14 NOTE — H&P (Signed)
PCP:   [name], MD   Chief Complaint:  Burning urination  HPI: This is a pleasant 89 year old female with past medical history HTN, H LD GERD.  Patient lives at home with caretakers.  Patient is here with complaints of a left heel ulcer that started a few weeks ago.  The pain has been increasing and she has had some increased drainage.  Additionally she endorses burning with urination.  She was seen by her doctor, prescribed an antibiotic which she states has not helped.  She denies fevers, chills, cough or wheeze.   Review of Systems:  Per HPI  Past Medical History: Past Medical History:  Diagnosis Date   Arthritis    High cholesterol    Hypertension    Past Surgical History:  Procedure Laterality Date   ABDOMINAL HYSTERECTOMY     BLADDER SUSPENSION N/A 04/25/2016   Procedure: TRANSVAGINAL TAPE (TVT) PROCEDURE;  Surgeon: Carrington Clamp, MD;  Location: [facility];  Service: Gynecology;  Laterality: N/A;   CARDIAC CATHETERIZATION     CYSTOCELE REPAIR N/A 04/25/2016   Procedure: ANTERIOR REPAIR (CYSTOCELE);  Surgeon: Carrington Clamp, MD;  Location: [facility];  Service: Gynecology;  Laterality: N/A;   CYSTOSCOPY N/A 04/25/2016   Procedure: CYSTOSCOPY;  Surgeon: Carrington Clamp, MD;  Location: [facility];  Service: Gynecology;  Laterality: N/A;   EYE SURGERY      Medications: Prior to Admission medications   Medication Sig Start Date End Date Taking? Authorizing Provider  amLODipine (NORVASC) 2.5 MG tablet Take 2.5 mg by mouth every morning. 06/01/22  Yes [provider]  acetaminophen (TYLENOL) 500 MG tablet Take 1,000 mg by mouth every 6 (six) hours as needed for mild pain.    [provider]  Apoaequorin (PREVAGEN EXTRA STRENGTH) 20 MG CAPS Take 40 mg by mouth daily.    [provider]  Carboxymethylcellul-Glycerin (CLEAR EYES FOR DRY EYES OP) Apply 1 drop to eye daily as needed (for dry eyes).    [provider]  lisinopril (ZESTRIL) 20 MG  tablet Take 20 mg by mouth daily.    [provider]  lovastatin (MEVACOR) 20 MG tablet Take 20 mg by mouth daily.    [provider]  metoprolol (LOPRESSOR) 50 MG tablet Take 50 mg by mouth 2 (two) times daily.    [provider]  Multiple Vitamin (MULTIVITAMIN WITH MINERALS) TABS Take 1 tablet by mouth daily.    [provider]  Omega-3 Fatty Acids (FISH OIL) 1000 MG CAPS Take 1,000 mg by mouth daily.    [provider]  pantoprazole (PROTONIX) 40 MG tablet Take 1 tablet (40 mg total) by mouth daily. Patient taking differently: Take 40 mg by mouth daily as needed (acid reflux). 02/23/20   Leroy Sea, MD  solifenacin (VESICARE) 10 MG tablet Take 10 mg by mouth at bedtime. 10/23/21   [provider]  tamsulosin (FLOMAX) 0.4 MG CAPS capsule Take 1 capsule (0.4 mg total) by mouth daily. Patient not taking: Reported on 01/07/2022 02/24/20   Leroy Sea, MD    Allergies:  No Known Allergies  Social History:  reports that she has never smoked. She has never used smokeless tobacco. She reports that she does not drink alcohol and does not use drugs.  Family History: Family History  Problem Relation Age of Onset   Cancer Sister     Physical Exam: Vitals:   07/14/22 1715 07/14/22 1830 07/14/22 1900 07/14/22 2000  BP: 131/67 (!) 152/79 (!) 150/78 Marland Kitchen)  153/75  Pulse: 78 80 77 75  Resp: 18 18  14   Temp: 98.7 F (37.1 C)   98.6 F (37 C)  TempSrc: Rectal     SpO2: 98% 99% 99% 98%    General:  Alert and oriented times three, contracted, frail, cachectic female. Eyes: Pink conjunctiva, no scleral icterus ENT: Dry chapped oral mucosa,  no thyromegaly Lungs: clear to ascultation, no wheeze, no crackles, no use of accessory muscles Cardiovascular: Positive murmur, regular rate and rhythm, no regurgitation, no gallops, no murmurs. No carotid bruits, no JVD Abdomen: soft, positive BS, non-tender, non-distended, not an acute  abdomen GU: not examined Neuro: CN II - XII grossly intact Musculoskeletal: no clubbing, cyanosis or edema Skin: Left heel pressure ulcer Psych: appropriate patient  Labs on Admission:  Recent Labs    07/14/22 1754  NA 143  K 4.5  CL 111  CO2 20*  GLUCOSE 102*  BUN 49*  CREATININE 1.02*  CALCIUM 9.7    Recent Labs    07/14/22 1754  WBC 10.6*  NEUTROABS 7.9*  HGB 9.7*  HCT 32.2*  MCV 96.1  PLT 402*    Micro Results: No results found for this or any previous visit (from the past 240 hour(s)).   Radiological Exams on Admission: DG Foot Complete Left  Result Date: 07/14/2022 CLINICAL DATA:  Heel wound EXAM: LEFT FOOT - COMPLETE 3 VIEW COMPARISON:  None Available. FINDINGS: Evidence of acute fracture or dislocation. Mild degenerative changes of the midfoot and IP joints. Vascular calcifications. Diffuse demineralization. Soft tissue ulceration of the heel. Mild cortical irregularity of the underlying calcaneus. IMPRESSION: Soft tissue ulceration of the heel. Mild cortical irregularity of the underlying calcaneus which could be due to osteomyelitis. Findings can be further evaluated with MRI. Electronically Signed   By: Allegra Lai M.D.   On: 07/14/2022 17:45   DG Chest Port 1 View  Result Date: 07/14/2022 CLINICAL DATA:  Wound on left foot. EXAM: PORTABLE CHEST 1 VIEW COMPARISON:  01/05/2022 FINDINGS: Stable cardiomediastinal silhouette. Aortic atherosclerotic calcification. Chronic bronchitic changes. Left basilar atelectasis or scarring. There are a few infiltrates in the right mid lung. No pleural effusion or pneumothorax. No acute osseous abnormality. IMPRESSION: 1. There are a few infiltrates in the right mid lung, likely infectious/inflammatory. Electronically Signed   By: Minerva Fester M.D.   On: 07/14/2022 17:45    Assessment/Plan Present on Admission: Left heel ulcer -MRI r/o osteo -IV vanco ordered -wound care consult placed   Pneumonia due to infectious  organism  UTI (urinary tract infection) -PNA orderset initiated -Rocephin and azithro -resp panel ordered  Clinical dehydration -Gentle IV fluid hydration  HTN -Norvasc, metoprolol, lisinopril resumed  HLD -Lovastatin resumed  Royer Cristobal 07/14/2022, 8:40 PM

## 2022-07-14 NOTE — ED Notes (Signed)
..ED TO INPATIENT HANDOFF REPORT  ED Nurse Name and Phone #: [phone]  S Name/Age/Gender Sylvia Petersen 89 y.o. female Room/Bed: 027C/027C  Code Status   Code Status: Full Code  Home/SNF/Other Home Patient oriented to: self, place, time, and situation Is this baseline? Yes   Triage Complete: Triage complete  Chief Complaint Pneumonia due to infectious organism [J18.9]  Triage Note [facility] reports pt coming from home for a wound on her left foot. Pt was just removed from hospice today when EMS was called to the home, family wants every done now for pt. Pt c/o left foot pain as well as her buttocks.   Allergies No Known Allergies  Level of Care/Admitting Diagnosis ED Disposition     ED Disposition  Admit   Condition  --   Comment  Hospital Area: [facility] [[id number]]  Level of Care: Med-Surg [16]  May admit patient to [facility] or [facility] if equivalent level of care is available:: Yes  Covid Evaluation: Confirmed COVID Negative  Diagnosis: Pneumonia due to infectious organism [[id number]]  Admitting Physician: Gery Pray [[id number]]  Attending Physician: Gery Pray [[id number]]  Certification:: I certify this patient will need inpatient services for at least 2 midnights  Estimated Length of Stay: 2          B Medical/Surgery History Past Medical History:  Diagnosis Date   Arthritis    High cholesterol    Hypertension    Past Surgical History:  Procedure Laterality Date   ABDOMINAL HYSTERECTOMY     BLADDER SUSPENSION N/A 04/25/2016   Procedure: TRANSVAGINAL TAPE (TVT) PROCEDURE;  Surgeon: Carrington Clamp, MD;  Location: [facility];  Service: Gynecology;  Laterality: N/A;   CARDIAC CATHETERIZATION     CYSTOCELE REPAIR N/A 04/25/2016   Procedure: ANTERIOR REPAIR (CYSTOCELE);  Surgeon: Carrington Clamp, MD;  Location: [facility];  Service: Gynecology;  Laterality: N/A;   CYSTOSCOPY N/A 04/25/2016   Procedure: CYSTOSCOPY;  Surgeon: Carrington Clamp,  MD;  Location: [facility];  Service: Gynecology;  Laterality: N/A;   EYE SURGERY       A IV Location/Drains/Wounds Patient Lines/Drains/Airways Status     Active Line/Drains/Airways     Name Placement date Placement time Site Days   Peripheral IV 07/14/22 20 G 1" Left;Posterior Hand 07/14/22  1644  Hand  less than 1            Intake/Output Last 24 hours No intake or output data in the 24 hours ending 07/14/22 2043  Labs/Imaging Results for orders placed or performed during the hospital encounter of 07/14/22 (from the past 48 hour(s))  Urinalysis, Routine w reflex microscopic -Urine, Clean Catch     Status: Abnormal   Collection Time: 07/14/22  5:30 PM  Result Value Ref Range   Color, Urine YELLOW YELLOW   APPearance CLOUDY (A) CLEAR   Specific Gravity, Urine 1.013 1.005 - 1.030   pH 5.0 5.0 - 8.0   Glucose, UA NEGATIVE NEGATIVE mg/dL   Hgb urine dipstick NEGATIVE NEGATIVE   Bilirubin Urine NEGATIVE NEGATIVE   Ketones, ur NEGATIVE NEGATIVE mg/dL   Protein, ur NEGATIVE NEGATIVE mg/dL   Nitrite NEGATIVE NEGATIVE   Leukocytes,Ua LARGE (A) NEGATIVE   RBC / HPF 0-5 0 - 5 RBC/hpf   WBC, UA >50 0 - 5 WBC/hpf   Bacteria, UA RARE (A) NONE SEEN   Squamous Epithelial / HPF 0-5 0 - 5 /HPF   WBC Clumps PRESENT    Mucus PRESENT  Budding Yeast PRESENT     Comment: Performed at [facility], [address], [place], [place] [address]  CBC with Differential/Platelet     Status: Abnormal   Collection Time: 07/14/22  5:54 PM  Result Value Ref Range   WBC 10.6 (H) 4.0 - 10.5 K/uL   RBC 3.35 (L) 3.87 - 5.11 MIL/uL   Hemoglobin 9.7 (L) 12.0 - 15.0 g/dL   HCT 60.4 (L) 54.0 - 98.1 %   MCV 96.1 80.0 - 100.0 fL   MCH 29.0 26.0 - 34.0 pg   MCHC 30.1 30.0 - 36.0 g/dL   RDW 19.1 47.8 - 29.5 %   Platelets 402 (H) 150 - 400 K/uL   nRBC 0.0 0.0 - 0.2 %   Neutrophils Relative % 75 %   Neutro Abs 7.9 (H) 1.7 - 7.7 K/uL   Lymphocytes Relative 17 %   Lymphs Abs 1.8 0.7 - 4.0 K/uL    Monocytes Relative 6 %   Monocytes Absolute 0.6 0.1 - 1.0 K/uL   Eosinophils Relative 1 %   Eosinophils Absolute 0.2 0.0 - 0.5 K/uL   Basophils Relative 1 %   Basophils Absolute 0.1 0.0 - 0.1 K/uL   Immature Granulocytes 0 %   Abs Immature Granulocytes 0.02 0.00 - 0.07 K/uL    Comment: Performed at [facility], [address] [place], [place] [address]  Basic metabolic panel     Status: Abnormal   Collection Time: 07/14/22  5:54 PM  Result Value Ref Range   Sodium 143 135 - 145 mmol/L   Potassium 4.5 3.5 - 5.1 mmol/L   Chloride 111 98 - 111 mmol/L   CO2 20 (L) 22 - 32 mmol/L   Glucose, Bld 102 (H) 70 - 99 mg/dL    Comment: Glucose reference range applies only to samples taken after fasting for at least 8 hours.   BUN 49 (H) 8 - 23 mg/dL   Creatinine, Ser 8.65 (H) 0.44 - 1.00 mg/dL   Calcium 9.7 8.9 - 78.4 mg/dL   GFR, Estimated 52 (L) >60 mL/min    Comment: (NOTE) Calculated using the CKD-EPI Creatinine Equation (2021)    Anion gap 12 5 - 15    Comment: Performed at [facility], [address], [place], [place] [address]  Lactic acid, plasma     Status: None   Collection Time: 07/14/22  6:02 PM  Result Value Ref Range   Lactic Acid, Venous 1.1 0.5 - 1.9 mmol/L    Comment: Performed at [facility], [address], [place], [place] [address]   DG Foot Complete Left  Result Date: 07/14/2022 CLINICAL DATA:  Heel wound EXAM: LEFT FOOT - COMPLETE 3 VIEW COMPARISON:  None Available. FINDINGS: Evidence of acute fracture or dislocation. Mild degenerative changes of the midfoot and IP joints. Vascular calcifications. Diffuse demineralization. Soft tissue ulceration of the heel. Mild cortical irregularity of the underlying calcaneus. IMPRESSION: Soft tissue ulceration of the heel. Mild cortical irregularity of the underlying calcaneus which could be due to osteomyelitis. Findings can be further evaluated with MRI. Electronically Signed   By: Allegra Lai M.D.   On:  07/14/2022 17:45   DG Chest Port 1 View  Result Date: 07/14/2022 CLINICAL DATA:  Wound on left foot. EXAM: PORTABLE CHEST 1 VIEW COMPARISON:  01/05/2022 FINDINGS: Stable cardiomediastinal silhouette. Aortic atherosclerotic calcification. Chronic bronchitic changes. Left basilar atelectasis or scarring. There are a few infiltrates in the right mid lung.  No pleural effusion or pneumothorax. No acute osseous abnormality. IMPRESSION: 1. There are a few infiltrates in the right mid lung, likely infectious/inflammatory. Electronically Signed   By: Minerva Fester M.D.   On: 07/14/2022 17:45    Pending Labs Unresulted Labs (From admission, onward)     Start     Ordered   07/21/22 0500  Creatinine, serum  (enoxaparin (LOVENOX)    CrCl >/= 30 ml/min)  Weekly,   R     Comments: while on enoxaparin therapy    07/14/22 2028   07/15/22 0500  Basic metabolic panel  Tomorrow morning,   R        07/14/22 2028   07/15/22 0500  Magnesium  Tomorrow morning,   R        07/14/22 2028   07/15/22 0500  CBC with Differential/Platelet  Tomorrow morning,   R        07/14/22 2028   07/14/22 2030  Legionella Pneumophila Serogp 1 Ur Ag  Once,   R        07/14/22 2031   07/14/22 2030  Strep pneumoniae urinary antigen  Once,   R        07/14/22 2031   07/14/22 2026  Urinalysis, w/ Reflex to Culture (Infection Suspected) -Urine, Clean Catch  (Urine Labs)  Add-on,   AD       Question:  Specimen Source  Answer:  Urine, Clean Catch   07/14/22 2025   07/14/22 1802  Culture, blood (Routine X 2) w Reflex to ID Panel  BLOOD CULTURE X 2,   R (with STAT occurrences),   Status:  Canceled     Question:  Patient immune status  Answer:  Normal   07/14/22 1802   07/14/22 1802  Lactic acid, plasma  Now then every 2 hours,   R (with STAT occurrences)      07/14/22 1802            Vitals/Pain Today's Vitals   07/14/22 1715 07/14/22 1830 07/14/22 1900 07/14/22 2000  BP: 131/67 (!) 152/79 (!) 150/78 (!) 153/75  Pulse: 78 80  77 75  Resp: 18 18  14   Temp: 98.7 F (37.1 C)   98.6 F (37 C)  TempSrc: Rectal     SpO2: 98% 99% 99% 98%  PainSc:        Isolation Precautions No active isolations  Medications Medications  0.9 %  sodium chloride infusion ( Intravenous New Bag/Given 07/14/22 1736)  enoxaparin (LOVENOX) injection 40 mg (has no administration in time range)  acetaminophen (TYLENOL) tablet 650 mg (has no administration in time range)    Or  acetaminophen (TYLENOL) suppository 650 mg (has no administration in time range)  senna-docusate (Senokot-S) tablet 1 tablet (has no administration in time range)  azithromycin (ZITHROMAX) 500 mg in sodium chloride 0.9 % 250 mL IVPB (has no administration in time range)  cefTRIAXone (ROCEPHIN) 1 g in sodium chloride 0.9 % 100 mL IVPB (has no administration in time range)  cefTRIAXone (ROCEPHIN) 2 g in sodium chloride 0.9 % 100 mL IVPB (has no administration in time range)    Mobility manual wheelchair     Focused Assessments Cardiac Assessment Handoff:    Lab Results  Component Value Date   CKTOTAL 37 (L) 01/05/2022   TROPONINI <0.30 12/27/2011   Lab Results  Component Value Date   DDIMER 1.88 (H) 02/17/2020   Does the Patient currently have chest pain? No    R Recommendations: See  Admitting Provider Note  Report given to:   Additional Notes: pt lives at home alone with home health sitters in the morning and evening.  Has had 1 gm of rocephin and needs another after zithromax finishes

## 2022-07-14 NOTE — ED Notes (Signed)
Dressing applied to left heal and buttocks per provider.

## 2022-07-14 NOTE — ED Triage Notes (Signed)
[facility] reports pt coming from home for a wound on her left foot. Pt was just removed from hospice today when EMS was called to the home, family wants every done now for pt. Pt c/o left foot pain as well as her buttocks.

## 2022-07-14 NOTE — ED Provider Notes (Signed)
[facility]MENT AT [facility] Provider Note   CSN: [id number] Arrival date & time: 07/14/22  1646     History  Chief Complaint  Patient presents with   Wound Check    Sylvia Petersen is a 89 y.o. female.  This is a 89 year old female presents with concerns for a wound to her left heel as well as her sacral area.  According to EMS, patient was just removed from hospice today and family wanted patient checked out for her wounds.  Possible history of fever.  Patient herself denies any cough or shortness of breath.  Is unsure if she has had urinary symptoms but denies any abdominal pain.       Home Medications Prior to Admission medications   Medication Sig Start Date End Date Taking? Authorizing Provider  acetaminophen (TYLENOL) 500 MG tablet Take 1,000 mg by mouth every 6 (six) hours as needed for mild pain.    [provider]  Apoaequorin (PREVAGEN EXTRA STRENGTH) 20 MG CAPS Take 40 mg by mouth daily.    [provider]  Carboxymethylcellul-Glycerin (CLEAR EYES FOR DRY EYES OP) Apply 1 drop to eye daily as needed (for dry eyes).    [provider]  lovastatin (MEVACOR) 20 MG tablet Take 20 mg by mouth daily.    [provider]  metoprolol (LOPRESSOR) 50 MG tablet Take 50 mg by mouth 2 (two) times daily.    [provider]  Multiple Vitamin (MULTIVITAMIN WITH MINERALS) TABS Take 1 tablet by mouth daily.    [provider]  Omega-3 Fatty Acids (FISH OIL) 1000 MG CAPS Take 1,000 mg by mouth daily.    [provider]  pantoprazole (PROTONIX) 40 MG tablet Take 1 tablet (40 mg total) by mouth daily. Patient taking differently: Take 40 mg by mouth daily as needed (acid reflux). 02/23/20   Leroy Sea, MD  solifenacin (VESICARE) 10 MG tablet Take 10 mg by mouth at bedtime. 10/23/21   [provider]  tamsulosin (FLOMAX) 0.4 MG CAPS capsule Take 1 capsule (0.4 mg total) by mouth  daily. Patient not taking: Reported on 01/07/2022 02/24/20   Leroy Sea, MD      Allergies    Patient has no known allergies.    Review of Systems   Review of Systems  All other systems reviewed and are negative.   Physical Exam Updated Vital Signs There were no vitals taken for this visit. Physical Exam Vitals and nursing note reviewed.  Constitutional:      General: She is not in acute distress.    Appearance: Normal appearance. She is well-developed. She is not toxic-appearing.  HENT:     Head: Normocephalic and atraumatic.  Eyes:     General: Lids are normal.     Conjunctiva/sclera: Conjunctivae normal.     Pupils: Pupils are equal, round, and reactive to light.  Neck:     Thyroid: No thyroid mass.     Trachea: No tracheal deviation.  Cardiovascular:     Rate and Rhythm: Normal rate and regular rhythm.     Heart sounds: Normal heart sounds. No murmur heard.    No gallop.  Pulmonary:     Effort: Pulmonary effort is normal. No respiratory distress.     Breath sounds: Normal breath sounds. No stridor. No decreased breath sounds, wheezing, rhonchi or rales.  Abdominal:     General: There is no distension.     Palpations: Abdomen is soft.  Tenderness: There is no abdominal tenderness. There is no rebound.  Musculoskeletal:        General: No tenderness. Normal range of motion.     Cervical back: Normal range of motion and neck supple.       Legs:     Comments: Left heel with ulcer noted without surrounding cellulitis  Skin:    General: Skin is warm and dry.     Findings: No abrasion or rash.  Neurological:     General: No focal deficit present.     Mental Status: She is alert and oriented to person, place, and time. Mental status is at baseline.     GCS: GCS eye subscore is 4. GCS verbal subscore is 5. GCS motor subscore is 6.     Cranial Nerves: No cranial nerve deficit.     Sensory: No sensory deficit.     Motor: Atrophy present.  Psychiatric:         Attention and Perception: Attention normal.        Speech: Speech normal.        Behavior: Behavior normal.     ED Results / Procedures / Treatments   Labs (all labs ordered are listed, but only abnormal results are displayed) Labs Reviewed  CBC WITH DIFFERENTIAL/PLATELET  BASIC METABOLIC PANEL  URINALYSIS, ROUTINE W REFLEX MICROSCOPIC    EKG None  Radiology No results found.  Procedures Procedures    Medications Ordered in ED Medications  0.9 %  sodium chloride infusion (has no administration in time range)    ED Course/ Medical Decision Making/ A&P                             Medical Decision Making Amount and/or Complexity of Data Reviewed Labs: ordered. Radiology: ordered.  Risk Prescription drug management.   Patient's chest x-ray per interpretation consistent with early infiltrates.  Urinalysis is also shows infection however this appears to be chronic.  Patient's lactate is not elevated.  I do not feel that she is septic however do feel that she requires admission due to her pneumonia.  Started on IV antibiotics and blood cultures obtained.  Patient is x-ray of her left heel shows findings concerning for possible osteomyelitis.  This will need to be evaluated in inpatient setting..  Will consult hospitalist for admission        Final Clinical Impression(s) / ED Diagnoses Final diagnoses:  None    Rx / DC Orders ED Discharge Orders     None         Lorre Nick, MD 07/14/22 1948

## 2022-07-14 NOTE — Progress Notes (Signed)
Pharmacy Antibiotic Note  Sylvia Petersen is a 89 y.o. female admitted on 07/14/2022 presenting with foot wound.  Pharmacy has been consulted for vancomycin dosing.  Plan: Vancomycin 1g IV x 1, then 750 mg IV q 36h (eAUC 443) Monitor renal function, clinical progression and ability to narrow Vancomycin levels as indicated     Temp (24hrs), Avg:98.7 F (37.1 C), Min:98.6 F (37 C), Max:98.7 F (37.1 C)  Recent Labs  Lab 07/14/22 1754 07/14/22 1802  WBC 10.6*  --   CREATININE 1.02*  --   LATICACIDVEN  --  1.1    CrCl cannot be calculated (Unknown ideal weight.).    No Known Allergies  Daylene Posey, PharmD, [facility] Clinical Pharmacist ED Pharmacist Phone # [phone] 07/14/2022 9:13 PM

## 2022-07-15 DIAGNOSIS — L97421 Non-pressure chronic ulcer of left heel and midfoot limited to breakdown of skin: Secondary | ICD-10-CM | POA: Diagnosis not present

## 2022-07-15 DIAGNOSIS — J189 Pneumonia, unspecified organism: Secondary | ICD-10-CM | POA: Diagnosis not present

## 2022-07-15 LAB — CBC WITH DIFFERENTIAL/PLATELET
Abs Immature Granulocytes: 0.05 10*3/uL (ref 0.00–0.07)
Basophils Absolute: 0 10*3/uL (ref 0.0–0.1)
Basophils Relative: 0 %
Eosinophils Absolute: 0.3 10*3/uL (ref 0.0–0.5)
Eosinophils Relative: 3 %
HCT: 28.9 % — ABNORMAL LOW (ref 36.0–46.0)
Hemoglobin: 9 g/dL — ABNORMAL LOW (ref 12.0–15.0)
Immature Granulocytes: 1 %
Lymphocytes Relative: 20 %
Lymphs Abs: 1.7 10*3/uL (ref 0.7–4.0)
MCH: 29.2 pg (ref 26.0–34.0)
MCHC: 31.1 g/dL (ref 30.0–36.0)
MCV: 93.8 fL (ref 80.0–100.0)
Monocytes Absolute: 0.7 10*3/uL (ref 0.1–1.0)
Monocytes Relative: 8 %
Neutro Abs: 6.1 10*3/uL (ref 1.7–7.7)
Neutrophils Relative %: 68 %
Platelets: 396 10*3/uL (ref 150–400)
RBC: 3.08 MIL/uL — ABNORMAL LOW (ref 3.87–5.11)
RDW: 13.2 % (ref 11.5–15.5)
WBC: 8.8 10*3/uL (ref 4.0–10.5)
nRBC: 0 % (ref 0.0–0.2)

## 2022-07-15 LAB — C-REACTIVE PROTEIN: CRP: 3.3 mg/dL — ABNORMAL HIGH (ref <1.0)

## 2022-07-15 LAB — BASIC METABOLIC PANEL
Anion gap: 6 (ref 5–15)
BUN: 39 mg/dL — ABNORMAL HIGH (ref 8–23)
CO2: 19 mmol/L — ABNORMAL LOW (ref 22–32)
Calcium: 9 mg/dL (ref 8.9–10.3)
Chloride: 115 mmol/L — ABNORMAL HIGH (ref 98–111)
Creatinine, Ser: 0.79 mg/dL (ref 0.44–1.00)
GFR, Estimated: >60 mL/min (ref >60)
Glucose, Bld: 92 mg/dL (ref 70–99)
Potassium: 3.6 mmol/L (ref 3.5–5.1)
Sodium: 140 mmol/L (ref 135–145)

## 2022-07-15 LAB — PROCALCITONIN: Procalcitonin: <0.1 ng/mL

## 2022-07-15 LAB — MAGNESIUM: Magnesium: 2.2 mg/dL (ref 1.7–2.4)

## 2022-07-15 LAB — MRSA NEXT GEN BY PCR, NASAL: MRSA by PCR Next Gen: NOT DETECTED

## 2022-07-15 MED ORDER — SODIUM CHLORIDE 0.9 % IV SOLN: INTRAVENOUS | Status: DC

## 2022-07-15 MED ORDER — SODIUM CHLORIDE 0.9 % IV SOLN
3.0000 g | Freq: Four times a day (QID) | INTRAVENOUS | Status: DC
  Administered 2022-07-15 – 2022-07-17 (×7): 3 g via INTRAVENOUS
  Filled 2022-07-15 (×8): qty 8

## 2022-07-15 MED ORDER — PROSOURCE PLUS PO LIQD
30.0000 mL | Freq: Two times a day (BID) | ORAL | Status: DC
  Administered 2022-07-15 – 2022-07-17 (×4): 30 mL via ORAL
  Filled 2022-07-15 (×4): qty 30

## 2022-07-15 MED ORDER — POTASSIUM CHLORIDE CRYS ER 20 MEQ PO TBCR
40.0000 meq | EXTENDED_RELEASE_TABLET | Freq: Once | ORAL | Status: AC
  Administered 2022-07-15: 40 meq via ORAL
  Filled 2022-07-15: qty 2

## 2022-07-15 MED ORDER — PRAVASTATIN SODIUM 10 MG PO TABS
20.0000 mg | ORAL_TABLET | Freq: Every day | ORAL | Status: DC
  Administered 2022-07-15 – 2022-07-16 (×2): 20 mg via ORAL
  Filled 2022-07-15 (×2): qty 2

## 2022-07-15 MED ORDER — PANTOPRAZOLE SODIUM 40 MG PO TBEC
40.0000 mg | DELAYED_RELEASE_TABLET | Freq: Every day | ORAL | Status: DC
  Administered 2022-07-16 – 2022-07-17 (×2): 40 mg via ORAL
  Filled 2022-07-15 (×2): qty 1

## 2022-07-15 MED ORDER — FESOTERODINE FUMARATE ER 4 MG PO TB24
4.0000 mg | ORAL_TABLET | Freq: Every day | ORAL | Status: DC
  Administered 2022-07-15 – 2022-07-17 (×3): 4 mg via ORAL
  Filled 2022-07-15 (×3): qty 1

## 2022-07-15 MED ORDER — AMLODIPINE BESYLATE 5 MG PO TABS: 5.0000 mg | ORAL_TABLET | Freq: Every day | ORAL | Status: DC

## 2022-07-15 MED ORDER — APOAEQUORIN 20 MG PO CAPS: 40.0000 mg | ORAL_CAPSULE | Freq: Every day | ORAL | Status: DC

## 2022-07-15 MED ORDER — TAMSULOSIN HCL 0.4 MG PO CAPS: 0.4000 mg | ORAL_CAPSULE | Freq: Every day | ORAL | Status: DC

## 2022-07-15 MED ORDER — DOXYCYCLINE HYCLATE 100 MG PO TABS
100.0000 mg | ORAL_TABLET | Freq: Two times a day (BID) | ORAL | Status: DC
  Administered 2022-07-15 – 2022-07-17 (×5): 100 mg via ORAL
  Filled 2022-07-15 (×5): qty 1

## 2022-07-15 NOTE — Evaluation (Signed)
Physical Therapy Evaluation Patient Details Name: Sylvia Petersen MRN: [id number] DOB: Aug 14, 1929 Today's Date: 07/15/2022  History of Present Illness  Pt is a 89 y/o F presenting to ED on 5/4 with L foot wound and painful urination, admitted for PNA due to infectious organism. PMH includes arthritis, high cholesterol and HTN  Clinical Impression  Pt admitted with above diagnosis. Pt was able to sit EOB with min guard to min assist. Needed +2 max assist to transfer to chair and pt used wheelchair at home. Pt with extremely flexed trunk, flexed neck (unable to extend neck with ROM as she is very rigid and has neck flexion contracture), and flexion contractures of bil LEs.  Pt had caregivers part of day PTA and would benefit from SNF at this point as she has declined in ability to assist and needs 24 hour care. Will follow acutely.  Pt currently with functional limitations due to the deficits listed below (see PT Problem List). Pt will benefit from acute skilled PT to increase their independence and safety with mobility to allow discharge.          Recommendations for follow up therapy are one component of a multi-disciplinary discharge planning process, led by the attending physician.  Recommendations may be updated based on patient status, additional functional criteria and insurance authorization.  Follow Up Recommendations Can patient physically be transported by private vehicle: No     Assistance Recommended at Discharge Frequent or constant Supervision/Assistance  Patient can return home with the following  A lot of help with walking and/or transfers;A lot of help with bathing/dressing/bathroom;Assistance with cooking/housework;Direct supervision/assist for medications management;Direct supervision/assist for financial management;Assistance with feeding;Assist for transportation;Help with stairs or ramp for entrance    Equipment Recommendations Other (comment) (TBA)  Recommendations for  Other Services       Functional Status Assessment Patient has had a recent decline in their functional status and demonstrates the ability to make significant improvements in function in a reasonable and predictable amount of time.     Precautions / Restrictions Precautions Precautions: Fall Restrictions Weight Bearing Restrictions: No Other Position/Activity Restrictions: WBAT per Dr. Thedore Mins on 5/5      Mobility  Bed Mobility Overal bed mobility: Needs Assistance Bed Mobility: Sidelying to Sit   Sidelying to sit: Max assist, +2 for physical assistance       General bed mobility comments: pt initiating trunk elevation but not able to give much effort as she tends to have trunk, neck and LEs in flexed posture.    Transfers Overall transfer level: Needs assistance Equipment used: 2 person hand held assist Transfers: Sit to/from Stand, Bed to chair/wheelchair/BSC Sit to Stand: Max assist, +2 physical assistance          Lateral/Scoot Transfers: Max assist, +2 physical assistance General transfer comment: Used pad to assist pt in scoot pivot to drop arm chair.    Ambulation/Gait                  Stairs            Wheelchair Mobility    Modified Rankin (Stroke Patients Only)       Balance Overall balance assessment: Needs assistance Sitting-balance support: Feet supported Sitting balance-Leahy Scale: Fair Sitting balance - Comments: min guard-minA sitting EOB with very flexed posture.   Standing balance support: During functional activity Standing balance-Leahy Scale: Zero  Pertinent Vitals/Pain Pain Assessment Pain Assessment: No/denies pain    Home Living Family/patient expects to be discharged to:: Private residence Living Arrangements: Alone Available Help at Discharge: Personal care attendant (reports she is alone in the mornings, has caregiver at night and afternoon) Type of Home: Mobile  home Home Access: Stairs to enter       Home Layout: One level Home Equipment: Agricultural consultant (2 wheels);Shower seat;BSC/3in1 Additional Comments: pt has caregivers from 8-12 and 5-9 each day. she is alone inbetween    Prior Function Prior Level of Function : Needs assist       Physical Assist : Mobility (physical);ADLs (physical) Mobility (physical): Bed mobility;Transfers ADLs (physical): Feeding;Grooming;Dressing;Bathing;IADLs;Toileting Mobility Comments: reports caregivers "pivot transfer" her to the w/c ADLs Comments: caregivers assist with ADLs and IADLs, pt reports "sometimes" she is able to feed herself     Hand Dominance        Extremity/Trunk Assessment   Upper Extremity Assessment Upper Extremity Assessment: Defer to OT evaluation    Lower Extremity Assessment Lower Extremity Assessment: RLE deficits/detail;LLE deficits/detail RLE Deficits / Details: grossly 3-/5 LLE Deficits / Details: grossly 3-/5    Cervical / Trunk Assessment Cervical / Trunk Assessment: Kyphotic  Communication   Communication: HOH  Cognition Arousal/Alertness: Awake/alert Behavior During Therapy: Flat affect Overall Cognitive Status: Within Functional Limits for tasks assessed                                 General Comments: slow processing, delayed responses        General Comments General comments (skin integrity, edema, etc.): VSS on RA    Exercises General Exercises - Lower Extremity Ankle Circles/Pumps: AROM, Both, 5 reps, Supine Long Arc Quad: AROM, Both, 10 reps, Seated   Assessment/Plan    PT Assessment Patient needs continued PT services  PT Problem List Decreased activity tolerance;Decreased balance;Decreased mobility;Decreased knowledge of use of DME;Decreased safety awareness;Decreased knowledge of precautions       PT Treatment Interventions DME instruction;Functional mobility training;Therapeutic activities;Therapeutic exercise;Balance  training;Patient/family education    PT Goals (Current goals can be found in the Care Plan section)  Acute Rehab PT Goals Patient Stated Goal: to go to a SNF for more care PT Goal Formulation: With patient Time For Goal Achievement: 07/29/22 Potential to Achieve Goals: Good    Frequency Min 2X/week     Co-evaluation PT/OT/SLP Co-Evaluation/Treatment: Yes Reason for Co-Treatment: Complexity of the patient's impairments (multi-system involvement);For patient/therapist safety;To address functional/ADL transfers PT goals addressed during session: Mobility/safety with mobility OT goals addressed during session: ADL's and self-care;Strengthening/ROM       AM-PAC PT "6 Clicks" Mobility  Outcome Measure Help needed turning from your back to your side while in a flat bed without using bedrails?: Total Help needed moving from lying on your back to sitting on the side of a flat bed without using bedrails?: Total Help needed moving to and from a bed to a chair (including a wheelchair)?: Total Help needed standing up from a chair using your arms (e.g., wheelchair or bedside chair)?: Total Help needed to walk in hospital room?: Total Help needed climbing 3-5 steps with a railing? : Total 6 Click Score: 6    End of Session Equipment Utilized During Treatment: Gait belt Activity Tolerance: Patient limited by fatigue Patient left: in chair;with call bell/phone within reach;with chair alarm set Nurse Communication: Mobility status;Need for lift equipment (recommend Maximove back to bed)  PT Visit Diagnosis: Muscle weakness (generalized) (M62.81)    Time: 1610-9604 PT Time Calculation (min) (ACUTE ONLY): 29 min   Charges:   PT Evaluation $PT Eval Moderate Complexity: 1 Mod          Tacara Hadlock M,PT Acute Rehab Services [phone]   Bevelyn Buckles 07/15/2022, 2:32 PM

## 2022-07-15 NOTE — Progress Notes (Addendum)
Pharmacy Antibiotic Note  Sylvia Petersen is a 89 y.o. female admitted on 07/14/2022 with  chronic left heel ulcer with possible underlying early osteomyelitis currently treated with Vancomycin, ceftriaxone, azithromycin.  SCr 0.79 and at baseline. CrCl ~45 ml/min. Antibiotics adjusted for concern of aspiration pneumonia. Pharmacy has been consulted to dose Unasyn.   Plan: Start Unasyn 3 g IV every 6 hours Continue doxycycline 100 mg BID per MD Monitor for signs/symptoms of infection  Temp (24hrs), Avg:98.4 F (36.9 C), Min:98 F (36.7 C), Max:98.7 F (37.1 C)  Recent Labs  Lab 07/14/22 1754 07/14/22 1802 07/14/22 2305 07/15/22 0309  WBC 10.6*  --   --  8.8  CREATININE 1.02*  --   --  0.79  LATICACIDVEN  --  1.1 1.3  --     CrCl cannot be calculated (Unknown ideal weight.).    No Known Allergies  Antimicrobials this admission: Azithromycin, ceftriaxone, vancomycin 5/4 Unasyn 5/5 >>  Doxycycline 5/5 >>  Dose adjustments this admission:  Microbiology results: 5/4 BCx: no growth<24 hours 5/4 MRSA PCR: sent  Thank you for allowing pharmacy to be a part of this patient's care.  Georga Hacking 07/15/2022 9:54 AM

## 2022-07-15 NOTE — Evaluation (Signed)
Occupational Therapy Evaluation Patient Details Name: Sylvia Petersen MRN: [id number] DOB: [birth year] Today's Date: 07/15/2022   History of Present Illness Pt is a 89 y/o F presenting to ED on 5/4 with L foot wound and painful urination, admitted for PNA due to infectious organism. PMH includes arthritis, high cholesterol and HTN   Clinical Impression   Pt reports at baseline she has assist for ADLs and mobility, has caregivers during the day but is alone at times. Pt reports caregivers transfer her to her w/c and she is dependent for ADLs and sometimes is able to self feed/perform grooming tasks. Pt needing mod-max A for ADLs, max A +2 for bed mobility, and max A +2 for transfers with 2 Person HHA and use of pad. Pt presenting with impairments listed below, will follow acutely. Patient will benefit from continued inpatient follow up therapy, <3 hours/day to maximize safety and independence with ADLs/functional mobility.       Recommendations for follow up therapy are one component of a multi-disciplinary discharge planning process, led by the attending physician.  Recommendations may be updated based on patient status, additional functional criteria and insurance authorization.   Assistance Recommended at Discharge Frequent or constant Supervision/Assistance  Patient can return home with the following A lot of help with walking and/or transfers;A lot of help with bathing/dressing/bathroom;Direct supervision/assist for medications management;Direct supervision/assist for financial management;Assist for transportation;Help with stairs or ramp for entrance    Functional Status Assessment  Patient has had a recent decline in their functional status and demonstrates the ability to make significant improvements in function in a reasonable and predictable amount of time.  Equipment Recommendations  Other (comment) (defer)    Recommendations for Other Services PT consult     Precautions /  Restrictions Precautions Precautions: Fall Restrictions Weight Bearing Restrictions: No Other Position/Activity Restrictions: WBAT per Dr. Thedore Mins on 5/5      Mobility Bed Mobility Overal bed mobility: Needs Assistance Bed Mobility: Sidelying to Sit   Sidelying to sit: Max assist, +2 for physical assistance       General bed mobility comments: pt initiating trunk elevation    Transfers Overall transfer level: Needs assistance Equipment used: 2 person hand held assist Transfers: Sit to/from Stand, Bed to chair/wheelchair/BSC Sit to Stand: Max assist, +2 physical assistance   Squat pivot transfers: Max assist, +2 physical assistance              Balance Overall balance assessment: Needs assistance Sitting-balance support: Feet supported Sitting balance-Leahy Scale: Fair Sitting balance - Comments: min guard-minA sitting EOB   Standing balance support: During functional activity Standing balance-Leahy Scale: Zero                             ADL either performed or assessed with clinical judgement   ADL Overall ADL's : Needs assistance/impaired Eating/Feeding: Moderate assistance;Sitting   Grooming: Moderate assistance;Standing   Upper Body Bathing: Maximal assistance;Bed level   Lower Body Bathing: Maximal assistance;Bed level   Upper Body Dressing : Maximal assistance;Bed level   Lower Body Dressing: Maximal assistance;Bed level   Toilet Transfer: Maximal assistance   Toileting- Clothing Manipulation and Hygiene: Maximal assistance       Functional mobility during ADLs: Maximal assistance       Vision   Additional Comments: will further assess     Perception Perception Perception Tested?: No   Praxis Praxis Praxis tested?: Not tested    Pertinent Vitals/Pain  Pain Assessment Pain Assessment: No/denies pain     Hand Dominance     Extremity/Trunk Assessment Upper Extremity Assessment Upper Extremity Assessment: Generalized  weakness (limited shoulder active mobility)   Lower Extremity Assessment Lower Extremity Assessment: Defer to PT evaluation   Cervical / Trunk Assessment Cervical / Trunk Assessment: Kyphotic   Communication Communication Communication: HOH   Cognition Arousal/Alertness: Awake/alert Behavior During Therapy: Flat affect Overall Cognitive Status: Within Functional Limits for tasks assessed                                 General Comments: slow processing, delayed responses     General Comments  VSS On RA    Exercises     Shoulder Instructions      Home Living Family/patient expects to be discharged to:: Private residence Living Arrangements: Alone Available Help at Discharge: Personal care attendant (reports she is alone in the mornings, has caregiver at night and afternoon) Type of Home: Mobile home Home Access: Stairs to enter     Home Layout: One level         Firefighter: Standard     Home Equipment: Agricultural consultant (2 wheels);Shower seat;BSC/3in1          Prior Functioning/Environment Prior Level of Function : Needs assist       Physical Assist : Mobility (physical);ADLs (physical) Mobility (physical): Bed mobility;Transfers ADLs (physical): Feeding;Grooming;Dressing;Bathing;IADLs;Toileting Mobility Comments: reports caregivers "pivot transfer" her to the w/c ADLs Comments: caregivers assist with ADLs and IADLs, pt reports "sometimes" she is able to feed herself        OT Problem List: Decreased strength;Decreased range of motion;Decreased activity tolerance;Impaired balance (sitting and/or standing);Decreased safety awareness;Decreased cognition;Impaired UE functional use      OT Treatment/Interventions: Self-care/ADL training;Therapeutic exercise;DME and/or AE instruction;Energy conservation;Therapeutic activities;Patient/family education;Balance training    OT Goals(Current goals can be found in the care plan section) Acute  Rehab OT Goals Patient Stated Goal: none stated OT Goal Formulation: With patient Time For Goal Achievement: 07/29/22 Potential to Achieve Goals: Good ADL Goals Pt Will Perform Eating: with supervision;sitting Pt Will Perform Grooming: with supervision;sitting;bed level Pt Will Transfer to Toilet: with mod assist;with +2 assist;squat pivot transfer;stand pivot transfer;bedside commode Additional ADL Goal #1: pt will complete bed mobility mod A in prep for ADLs  OT Frequency: Min 1X/week    Co-evaluation PT/OT/SLP Co-Evaluation/Treatment: Yes Reason for Co-Treatment: Complexity of the patient's impairments (multi-system involvement);For patient/therapist safety;To address functional/ADL transfers   OT goals addressed during session: ADL's and self-care;Strengthening/ROM      AM-PAC OT "6 Clicks" Daily Activity     Outcome Measure Help from another person eating meals?: A Lot Help from another person taking care of personal grooming?: A Lot Help from another person toileting, which includes using toliet, bedpan, or urinal?: A Lot Help from another person bathing (including washing, rinsing, drying)?: A Lot Help from another person to put on and taking off regular upper body clothing?: A Lot Help from another person to put on and taking off regular lower body clothing?: A Lot 6 Click Score: 12   End of Session Equipment Utilized During Treatment: Gait belt Nurse Communication: Mobility status;Other (comment) (pt will need assist for feeding)  Activity Tolerance: Patient tolerated treatment well Patient left: in chair;with call bell/phone within reach;with chair alarm set  OT Visit Diagnosis: Unsteadiness on feet (R26.81);Other abnormalities of gait and mobility (R26.89);Muscle weakness (generalized) (M62.81)  Time: 1610-9604 OT Time Calculation (min): 30 min Charges:  OT General Charges $OT Visit: 1 Visit OT Evaluation $OT Eval Moderate Complexity: 1  Mod  Kamir Selover K, OTD, OTR/L SecureChat Preferred Acute Rehab [phone]   Carver Fila Koonce 07/15/2022, 11:43 AM

## 2022-07-15 NOTE — Consult Note (Signed)
WOC Nurse Consult Note: Reason for Consult:left medial heel pressure injury. Seen by Dr Lajoyce Corners this morning who does not recommend surgery. His note is appreciated.Prevalon boots are ordered. I am consulted for topical care guidance Wound type:pressure Pressure Injury POA: Yes Wound bed:Per flowsheet, pink, small yellow Drainage (amount, consistency, odor) scant serosanguinous Periwound:intact Topical care:  I have provided Nursing with guidance for topical care using a daily cleanse, rinse and dry followed by placement of xeroform gauze topped with silicone foam.  WOC nursing team will not follow, but will remain available to this patient, the nursing and medical teams.  Please re-consult if needed.  Thank you for inviting [place] to participate in this patient's Plan of Care.  Ladona Mow, MSN, RN, CNS, GNP, Leda Min, [employer], [employer] phone:  [phone]

## 2022-07-15 NOTE — Progress Notes (Signed)
PROGRESS NOTE                                                                                                                                                                                                             Patient Demographics:    Sylvia Petersen, is a 89 y.o. female, DOB - January 20, 1930, BJY:[id number]  Outpatient Primary MD for the patient is [name], MD    LOS - 1  Admit date - 07/14/2022    Chief Complaint  Patient presents with   Wound Check       Brief Narrative (HPI from H&P)   frail, cachectic elderly 89 year old Caucasian female with past medical history HTN, H LD GERD.  Patient lives at home with caretakers.  Patient is here with complaints of a left heel ulcer that started a few weeks ago.  The pain has been increasing and she has had some increased drainage, she was also having some dysuria and cough, x-ray suggestive of possible right-sided pneumonia, x-ray foot also suggestive of possible right heel osteomyelitis.  She was admitted for further treatment of right heel ulcer, possible osteomyelitis and aspiration pneumonia.  She is extremely frail and cachectic at baseline.   Subjective:    Marlinda Mike today has, No headache, No chest pain, No abdominal pain - No Nausea, No new weakness tingling or numbness, no SOB   Assessment  & Plan :    Chronic L Heel ulcer with possible underlying early osteomyelitis.  All present on admission. She has minimal to no surrounding cellulitis, has been placed on oral doxycycline and Unasyn, MRSA nasal PCR, follow cultures, wound care, orthopedics consulted for possible debridement, MRI of the left foot and ABI pending, not a candidate for big surgical procedure due to her underlying advanced age, severe deconditioning and cachexia.  Continue to monitor.  Aspiration pneumonia present on admission.  Placed on Unasyn.  Speech to follow, soft diet with feeding  assistance and aspiration precautions.  Severe cachexia, frail and deconditioned.  Lives alone with some caregiver support.  PT, OT, speech eval, may require placement.  Protein supplements.  Possible UTI.  Follow cultures for now on Unasyn.  Hypertension.  Blood pressure soft.  Gentle hydration home dose beta-blocker hold Norvasc and lisinopril.  Dyslipidemia.  Continue statin.      Condition - Extremely Guarded  Family Communication  :  called and message left for son [name] [phone]  on 07/15/2022 at 9:40 AM  Code Status :  Full   Consults  :  Ortho   PUD Prophylaxis : PPI   Procedures  :     MRI left foot.    ABI.      Disposition Plan  :    Status is: Inpatient   DVT Prophylaxis  :    enoxaparin (LOVENOX) injection 40 mg Start: 07/14/22 2200     Lab Results  Component Value Date   PLT 396 07/15/2022    Diet :  Diet Order             Diet Heart Room service appropriate? Yes; Fluid consistency: Thin  Diet effective now                    Inpatient Medications  Scheduled Meds:  Apoaequorin  40 mg Oral Daily   enoxaparin (LOVENOX) injection  40 mg Subcutaneous Q24H   fesoterodine  4 mg Oral Daily   metoprolol tartrate  50 mg Oral BID   pantoprazole  40 mg Oral Daily   pravastatin  20 mg Oral q1800   tamsulosin  0.4 mg Oral Daily   Continuous Infusions:  sodium chloride 125 mL/hr at 07/15/22 0981   azithromycin     cefTRIAXone (ROCEPHIN)  IV     [START ON 07/16/2022] vancomycin     PRN Meds:.acetaminophen **OR** acetaminophen, polyvinyl alcohol, senna-docusate   Objective:   Vitals:   07/14/22 2218 07/14/22 2321 07/15/22 0327 07/15/22 0800  BP: 126/64 (!) 114/52 (!) 114/50 (!) 115/52  Pulse: 74 67 63 67  Resp: 18 13 13 15   Temp:  98.2 F (36.8 C) 98 F (36.7 C) 98.1 F (36.7 C)  TempSrc:  Oral Oral Oral  SpO2: 100% 97% 96% 96%    Wt Readings from Last 3 Encounters:  01/08/22 59.4 kg  02/11/20 53.1 kg  09/03/19 64 kg     No intake or output data in the 24 hours ending 07/15/22 0941   Physical Exam  Extremely frail, cachectic, elderly Caucasian female lying in hospital bed in no distress but appears quite weak and lethargic, she is awake and alert, oriented x 2, No new F.N deficits, slow to respond, left heel ulcer under bandage [place].AT,PERRAL Supple Neck, No JVD,   Symmetrical Chest wall movement, Good air movement bilaterally, CTAB RRR,No Gallops,Rubs or new Murmurs,  +ve B.Sounds, Abd Soft, No tenderness,   No Cyanosis, Clubbing or edema        Data Review:    Recent Labs  Lab 07/14/22 1754 07/15/22 0309  WBC 10.6* 8.8  HGB 9.7* 9.0*  HCT 32.2* 28.9*  PLT 402* 396  MCV 96.1 93.8  MCH 29.0 29.2  MCHC 30.1 31.1  RDW 13.2 13.2  LYMPHSABS 1.8 1.7  MONOABS 0.6 0.7  EOSABS 0.2 0.3  BASOSABS 0.1 0.0    Recent Labs  Lab 07/14/22 1754 07/14/22 1802 07/14/22 2305 07/15/22 0309 07/15/22 0613  NA 143  --   --  140  --   K 4.5  --   --  3.6  --   CL 111  --   --  115*  --   CO2 20*  --   --  19*  --   ANIONGAP 12  --   --  6  --   GLUCOSE 102*  --   --  92  --  BUN 49*  --   --  39*  --   CREATININE 1.02*  --   --  0.79  --   CRP  --   --   --   --  3.3*  PROCALCITON  --   --   --   --  <0.10  LATICACIDVEN  --  1.1 1.3  --   --   MG  --   --   --  2.2  --   CALCIUM 9.7  --   --  9.0  --      Recent Labs  Lab 07/14/22 1754 07/14/22 1802 07/14/22 2305 07/15/22 0309 07/15/22 0613  WBC 10.6*  --   --  8.8  --   PLT 402*  --   --  396  --   CRP  --   --   --   --  3.3*  PROCALCITON  --   --   --   --  <0.10  LATICACIDVEN  --  1.1 1.3  --   --   CREATININE 1.02*  --   --  0.79  --       Radiology Reports DG Foot Complete Left  Result Date: 07/14/2022 CLINICAL DATA:  Heel wound EXAM: LEFT FOOT - COMPLETE 3 VIEW COMPARISON:  None Available. FINDINGS: Evidence of acute fracture or dislocation. Mild degenerative changes of the midfoot and IP joints. Vascular calcifications.  Diffuse demineralization. Soft tissue ulceration of the heel. Mild cortical irregularity of the underlying calcaneus. IMPRESSION: Soft tissue ulceration of the heel. Mild cortical irregularity of the underlying calcaneus which could be due to osteomyelitis. Findings can be further evaluated with MRI. Electronically Signed   By: Allegra Lai M.D.   On: 07/14/2022 17:45   DG Chest Port 1 View  Result Date: 07/14/2022 CLINICAL DATA:  Wound on left foot. EXAM: PORTABLE CHEST 1 VIEW COMPARISON:  01/05/2022 FINDINGS: Stable cardiomediastinal silhouette. Aortic atherosclerotic calcification. Chronic bronchitic changes. Left basilar atelectasis or scarring. There are a few infiltrates in the right mid lung. No pleural effusion or pneumothorax. No acute osseous abnormality. IMPRESSION: 1. There are a few infiltrates in the right mid lung, likely infectious/inflammatory. Electronically Signed   By: Minerva Fester M.D.   On: 07/14/2022 17:45      Signature  -   Susa Raring M.D on 07/15/2022 at 9:41 AM   -  To page go to [email or web address]

## 2022-07-15 NOTE — Progress Notes (Signed)
Order for Prevagen discontinued per [facility] herbal med policy.  Herbal products are not administered during hospital stay.

## 2022-07-15 NOTE — Progress Notes (Signed)
Orthopedic Tech Progress Note Patient Details:  Sylvia Petersen 08/08/1929 [id number]  Patient ID: Sylvia Petersen, female   DOB: 1930/03/05, 89 y.o.   MRN: [id number] Ortho techs do not provide prevalon boots, those are delivered by materials. [place] A Niah Heinle 07/15/2022, 11:01 AM

## 2022-07-15 NOTE — Evaluation (Signed)
Clinical/Bedside Swallow Evaluation Patient Details  Name: Sylvia Petersen MRN: [id number] Date of Birth: 1929-11-04  Today's Date: 07/15/2022 Time: SLP Start Time (ACUTE ONLY): 1350 SLP Stop Time (ACUTE ONLY): 1415 SLP Time Calculation (min) (ACUTE ONLY): 25 min  Past Medical History:  Past Medical History:  Diagnosis Date   Arthritis    High cholesterol    Hypertension    Past Surgical History:  Past Surgical History:  Procedure Laterality Date   ABDOMINAL HYSTERECTOMY     BLADDER SUSPENSION N/A 04/25/2016   Procedure: TRANSVAGINAL TAPE (TVT) PROCEDURE;  Surgeon: Carrington Clamp, MD;  Location: [facility];  Service: Gynecology;  Laterality: N/A;   CARDIAC CATHETERIZATION     CYSTOCELE REPAIR N/A 04/25/2016   Procedure: ANTERIOR REPAIR (CYSTOCELE);  Surgeon: Carrington Clamp, MD;  Location: [facility];  Service: Gynecology;  Laterality: N/A;   CYSTOSCOPY N/A 04/25/2016   Procedure: CYSTOSCOPY;  Surgeon: Carrington Clamp, MD;  Location: [facility];  Service: Gynecology;  Laterality: N/A;   EYE SURGERY     HPI:  Patient is a 89 y.o. female with PMH: HTN, HLD, GERD. She lives at home with caregivers. (Patient reports caregiver daily 8am-12pm and another caregiver daily 5pm-9pm) She presented to the hospital on 07/14/22 with left heel ulcer with increasing pain and increased drainage. In addition she has had painful urination. She was admitted with PNA due to infectious organism.    Assessment / Plan / Recommendation  Clinical Impression  Patient presents with clinical s/s of what appears to be an oral phase dysphagia as per this bedside swallow evaluation. Patient reports that she generally eats things such as: creamed potatoes, green beans, vegetable soup. SLP assessed her toleration of the following PO's: thin liquids, puree solids, mechanical soft solids. Swallow initiation appeared timely and no overt s/s aspiration or penetration observed. She did exhibit very prolonged mastication and oral  transit of PO's. Positioning was not ideal as her head was leaned down and to the right, making it difficult for her to accept boluses of food on spoon or fork. Although prolonged, mastication and oral transit was effective to clear oral cavity. SLP recommending downgrade diet slightly to Dys 3 (mechanical soft) solids, continue with thin liquids and will plan to f/u at least one time to ensure toleration. SLP Visit Diagnosis: Dysphagia, unspecified (R13.10)    Aspiration Risk  Mild aspiration risk    Diet Recommendation Dysphagia 3 (Mech soft);Thin liquid   Liquid Administration via: Straw;Spoon Medication Administration: Whole meds with puree Supervision: Full supervision/cueing for compensatory strategies;Staff to assist with self feeding Compensations: Slow rate;Small sips/bites;Follow solids with liquid Postural Changes: Seated upright at 90 degrees    Other  Recommendations Oral Care Recommendations: Oral care BID    Recommendations for follow up therapy are one component of a multi-disciplinary discharge planning process, led by the attending physician.  Recommendations may be updated based on patient status, additional functional criteria and insurance authorization.  Follow up Recommendations No SLP follow up      Assistance Recommended at Discharge    Functional Status Assessment Patient has had a recent decline in their functional status and demonstrates the ability to make significant improvements in function in a reasonable and predictable amount of time.  Frequency and Duration min 1 x/week  1 week       Prognosis Prognosis for improved oropharyngeal function: Good      Swallow Study   General Date of Onset: 07/14/22 HPI: Patient is a 89 y.o. female with PMH:  HTN, HLD, GERD. She lives at home with caregivers. (Patient reports caregiver daily 8am-12pm and another caregiver daily 5pm-9pm) She presented to the hospital on 07/14/22 with left heel ulcer with increasing pain  and increased drainage. In addition she has had painful urination. She was admitted with PNA due to infectious organism. Type of Study: Bedside Swallow Evaluation Previous Swallow Assessment: none found Diet Prior to this Study: Regular;Thin liquids (Level 0) Temperature Spikes Noted: No Respiratory Status: Room air History of Recent Intubation: No Behavior/Cognition: Alert;Cooperative;Pleasant mood Oral Cavity Assessment: Within Functional Limits Oral Care Completed by SLP: No Oral Cavity - Dentition: Adequate natural dentition Vision: Functional for self-feeding Self-Feeding Abilities: Total assist Patient Positioning: Upright in bed Baseline Vocal Quality: Low vocal intensity;Normal Volitional Cough: Weak Volitional Swallow: Able to elicit    Oral/Motor/Sensory Function Overall Oral Motor/Sensory Function: Generalized oral weakness Facial ROM: Reduced right;Reduced left Facial Symmetry: Within Functional Limits Facial Strength: Reduced right;Reduced left Lingual Symmetry: Within Functional Limits Lingual Strength: Reduced   Ice Chips     Thin Liquid Thin Liquid: Within functional limits Presentation: Straw    Nectar Thick     Honey Thick     Puree Puree: Impaired Presentation: Spoon Oral Phase Functional Implications: Prolonged oral transit   Solid     Solid: Impaired Oral Phase Impairments: Impaired mastication Oral Phase Functional Implications: Prolonged oral transit;Impaired mastication;Oral residue     Angela Nevin, MA, CCC-SLP Speech Therapy

## 2022-07-15 NOTE — Consult Note (Signed)
ORTHOPAEDIC CONSULTATION  REQUESTING PHYSICIAN: Leroy Sea, MD  Chief Complaint: Left heel ulcer.  HPI: Sylvia Petersen is a 89 y.o. female who presents with contractures of both lower extremities  Past Medical History:  Diagnosis Date   Arthritis    High cholesterol    Hypertension    Past Surgical History:  Procedure Laterality Date   ABDOMINAL HYSTERECTOMY     BLADDER SUSPENSION N/A 04/25/2016   Procedure: TRANSVAGINAL TAPE (TVT) PROCEDURE;  Surgeon: Carrington Clamp, MD;  Location: [facility];  Service: Gynecology;  Laterality: N/A;   CARDIAC CATHETERIZATION     CYSTOCELE REPAIR N/A 04/25/2016   Procedure: ANTERIOR REPAIR (CYSTOCELE);  Surgeon: Carrington Clamp, MD;  Location: [facility];  Service: Gynecology;  Laterality: N/A;   CYSTOSCOPY N/A 04/25/2016   Procedure: CYSTOSCOPY;  Surgeon: Carrington Clamp, MD;  Location: [facility];  Service: Gynecology;  Laterality: N/A;   EYE SURGERY     Social History   Socioeconomic History   Marital status: Widowed    Spouse name: Not on file   Number of children: Not on file   Years of education: Not on file   Highest education level: Not on file  Occupational History   Not on file  Tobacco Use   Smoking status: Never   Smokeless tobacco: Never  Substance and Sexual Activity   Alcohol use: No   Drug use: No   Sexual activity: Not on file  Other Topics Concern   Not on file  Social History Narrative   Not on file   Social Determinants of Health   Financial Resource Strain: Not on file  Food Insecurity: No Food Insecurity (07/14/2022)   Hunger Vital Sign    Worried About Running Out of Food in the Last Year: Never true    Ran Out of Food in the Last Year: Never true  Transportation Needs: No Transportation Needs (07/14/2022)   PRAPARE - Administrator, Civil Service (Medical): No    Lack of Transportation (Non-Medical): No  Physical Activity: Not on file  Stress: Not on file  Social Connections: Not on file    Family History  Problem Relation Age of Onset   Cancer Sister    - negative except otherwise stated in the family history section No Known Allergies Prior to Admission medications   Medication Sig Start Date End Date Taking? Authorizing Provider  amLODipine (NORVASC) 2.5 MG tablet Take 2.5 mg by mouth every morning. 06/01/22  Yes [provider]  acetaminophen (TYLENOL) 500 MG tablet Take 1,000 mg by mouth every 6 (six) hours as needed for mild pain.    [provider]  Apoaequorin (PREVAGEN EXTRA STRENGTH) 20 MG CAPS Take 40 mg by mouth daily.    [provider]  Carboxymethylcellul-Glycerin (CLEAR EYES FOR DRY EYES OP) Apply 1 drop to eye daily as needed (for dry eyes).    [provider]  lisinopril (ZESTRIL) 20 MG tablet Take 20 mg by mouth daily.    [provider]  lovastatin (MEVACOR) 20 MG tablet Take 20 mg by mouth daily.    [provider]  metoprolol (LOPRESSOR) 50 MG tablet Take 50 mg by mouth 2 (two) times daily.    [provider]  Multiple Vitamin (MULTIVITAMIN WITH MINERALS) TABS Take 1 tablet by mouth daily.    [provider]  Omega-3 Fatty Acids (FISH OIL) 1000 MG CAPS Take 1,000 mg by mouth daily.    [provider]  pantoprazole (PROTONIX) 40 MG tablet Take 1 tablet (40 mg total) by mouth daily. Patient taking differently: Take 40 mg by mouth daily as needed (acid reflux). 02/23/20   Leroy Sea, MD  solifenacin (VESICARE) 10 MG tablet Take 10 mg by mouth at bedtime. 10/23/21   [provider]  tamsulosin (FLOMAX) 0.4 MG CAPS capsule Take 1 capsule (0.4 mg total) by mouth daily. Patient not taking: Reported on 01/07/2022 02/24/20   Leroy Sea, MD   DG Foot Complete Left  Result Date: 07/14/2022 CLINICAL DATA:  Heel wound EXAM: LEFT FOOT - COMPLETE 3 VIEW COMPARISON:  None Available. FINDINGS: Evidence of acute fracture or dislocation. Mild degenerative changes of the  midfoot and IP joints. Vascular calcifications. Diffuse demineralization. Soft tissue ulceration of the heel. Mild cortical irregularity of the underlying calcaneus. IMPRESSION: Soft tissue ulceration of the heel. Mild cortical irregularity of the underlying calcaneus which could be due to osteomyelitis. Findings can be further evaluated with MRI. Electronically Signed   By: Allegra Lai M.D.   On: 07/14/2022 17:45   DG Chest Port 1 View  Result Date: 07/14/2022 CLINICAL DATA:  Wound on left foot. EXAM: PORTABLE CHEST 1 VIEW COMPARISON:  01/05/2022 FINDINGS: Stable cardiomediastinal silhouette. Aortic atherosclerotic calcification. Chronic bronchitic changes. Left basilar atelectasis or scarring. There are a few infiltrates in the right mid lung. No pleural effusion or pneumothorax. No acute osseous abnormality. IMPRESSION: 1. There are a few infiltrates in the right mid lung, likely infectious/inflammatory. Electronically Signed   By: Minerva Fester M.D.   On: 07/14/2022 17:45   - pertinent xrays, CT, MRI studies were reviewed and independently interpreted  Positive ROS: All other systems have been reviewed and were otherwise negative with the exception of those mentioned in the HPI and as above.  Physical Exam: General: Alert, no acute distress Psychiatric: Patient is competent for consent with normal mood and affect Lymphatic: No axillary or cervical lymphadenopathy Cardiovascular: No pedal edema Respiratory: No cyanosis, no use of accessory musculature GI: No organomegaly, abdomen is soft and non-tender    Images:  @ENCIMAGES @  Labs:  Lab Results  Component Value Date   CRP 3.3 (H) 07/15/2022   CRP 1.6 (H) 02/17/2020   CRP 3.0 (H) 02/16/2020   LABURIC 4.2 02/14/2020   LABURIC 4.7 02/13/2020   REPTSTATUS PENDING 07/14/2022   CULT  07/14/2022    NO GROWTH < 24 HOURS Performed at [facility], [address] [place], [place] [address]    LABORGA ESCHERICHIA COLI (A)  01/05/2022    Lab Results  Component Value Date   ALBUMIN 3.1 (L) 01/06/2022   ALBUMIN 3.8 01/05/2022   ALBUMIN 2.5 (L) 02/17/2020   LABURIC 4.2 02/14/2020   LABURIC 4.7 02/13/2020        Latest Ref Rng & Units 07/15/2022    3:09 AM 07/14/2022    5:54 PM 01/06/2022    5:04 AM  CBC EXTENDED  WBC 4.0 - 10.5 K/uL 8.8  10.6  8.5   RBC 3.87 - 5.11 MIL/uL 3.08  3.35  3.50    3.54   Hemoglobin 12.0 - 15.0 g/dL 9.0  9.7  91.4   HCT 78.2 - 46.0 % 28.9  32.2  32.9   Platelets 150 - 400 K/uL 396  402  266   NEUT# 1.7 - 7.7 K/uL 6.1  7.9    Lymph# 0.7 - 4.0 K/uL 1.7  1.8      Neurologic: Patient does not have  protective sensation bilateral lower extremities.   MUSCULOSKELETAL:   Skin: Examination of the right heel there is no ulcers or skin breakdown.  Examination of the left heel she has an ulcer over the medial aspect of the heel this has 100% healthy granulation tissue is 2 cm in diameter.  There is no cellulitis or drainage.  The wound is flat with no exposed bone or tendon.  Patient has no ischemic changes in either foot.  Review of the radiographs shows significant peripheral vascular disease with calcification of the arteries through the midfoot.  There is slight cortical irregularity on the calcaneus.  Patient has flexion contracture of the hip and knee with her heels essentially against the buttocks.  I can gently straighten these out.  Assessment: Assessment: Contracture both lower extremities with a left heel ulcer medially.  Plan: Would recommend Prevalon boots to unload the ulcer.  While there is a little irregularity on the calcaneus do not feel that surgical intervention would be warranted.  Therefore do not feel a MRI scan is necessary.  Thank you for the consult and the opportunity to see Ms. Darrold Junker, MD [facility] [phone] 10:59 AM

## 2022-07-16 DIAGNOSIS — J189 Pneumonia, unspecified organism: Secondary | ICD-10-CM | POA: Diagnosis not present

## 2022-07-16 LAB — C-REACTIVE PROTEIN: CRP: 2.3 mg/dL — ABNORMAL HIGH (ref <1.0)

## 2022-07-16 LAB — CBC WITH DIFFERENTIAL/PLATELET
Abs Immature Granulocytes: 0.03 10*3/uL (ref 0.00–0.07)
Basophils Absolute: 0.1 10*3/uL (ref 0.0–0.1)
Basophils Relative: 1 %
Eosinophils Absolute: 0.3 10*3/uL (ref 0.0–0.5)
Eosinophils Relative: 4 %
HCT: 32.4 % — ABNORMAL LOW (ref 36.0–46.0)
Hemoglobin: 10.2 g/dL — ABNORMAL LOW (ref 12.0–15.0)
Immature Granulocytes: 0 %
Lymphocytes Relative: 16 %
Lymphs Abs: 1.4 10*3/uL (ref 0.7–4.0)
MCH: 29.7 pg (ref 26.0–34.0)
MCHC: 31.5 g/dL (ref 30.0–36.0)
MCV: 94.5 fL (ref 80.0–100.0)
Monocytes Absolute: 0.6 10*3/uL (ref 0.1–1.0)
Monocytes Relative: 7 %
Neutro Abs: 6.3 10*3/uL (ref 1.7–7.7)
Neutrophils Relative %: 72 %
Platelets: 430 10*3/uL — ABNORMAL HIGH (ref 150–400)
RBC: 3.43 MIL/uL — ABNORMAL LOW (ref 3.87–5.11)
RDW: 12.9 % (ref 11.5–15.5)
WBC: 8.7 10*3/uL (ref 4.0–10.5)
nRBC: 0 % (ref 0.0–0.2)

## 2022-07-16 LAB — PROCALCITONIN: Procalcitonin: <0.1 ng/mL

## 2022-07-16 LAB — MAGNESIUM: Magnesium: 2.1 mg/dL (ref 1.7–2.4)

## 2022-07-16 LAB — BRAIN NATRIURETIC PEPTIDE: B Natriuretic Peptide: 442 pg/mL — ABNORMAL HIGH (ref 0.0–100.0)

## 2022-07-16 NOTE — Plan of Care (Signed)

## 2022-07-16 NOTE — NC FL2 (Signed)
[place] MEDICAID FL2 LEVEL OF CARE FORM     IDENTIFICATION  Patient Name: Sylvia Petersen Birthdate: 1929-04-15 Sex: female Admission Date (Current Location): 07/14/2022  [facility] and [place] Number:  Producer, television/film/video and Address:  [facility], [address] [place], [place] [address]      Provider Number: [id number]  Attending Physician Name and Address:  Leroy Sea, MD  Relative Name and Phone Number:       Current Level of Care: Hospital Recommended Level of Care: Skilled Nursing Facility Prior Approval Number:    Date Approved/Denied:   PASRR Number: [id number]  Discharge Plan: SNF    Current Diagnoses: Patient Active Problem List   Diagnosis Date Noted   Chronic heel ulcer, left, limited to breakdown of skin (HCC) 07/15/2022   Pneumonia due to infectious organism 07/14/2022   UTI (urinary tract infection) 01/05/2022   SIRS (systemic inflammatory response syndrome) (HCC) 02/12/2020   ARF (acute renal failure) (HCC) 02/11/2020   Acute lower UTI 02/11/2020   Hypertensive urgency 02/11/2020   COVID 02/11/2020   Postoperative state 04/25/2016    Orientation RESPIRATION BLADDER Height & Weight     Self, Time, Situation, Place  Normal Incontinent, External catheter Weight:   Height:     BEHAVIORAL SYMPTOMS/MOOD NEUROLOGICAL BOWEL NUTRITION STATUS      Incontinent Diet (See dc summary)  AMBULATORY STATUS COMMUNICATION OF NEEDS Skin   Extensive Assist Verbally Other (Comment) (wound on heel)                       Personal Care Assistance Level of Assistance  Bathing, Feeding, Dressing Bathing Assistance: Maximum assistance Feeding assistance: Limited assistance Dressing Assistance: Maximum assistance     Functional Limitations Info             SPECIAL CARE FACTORS FREQUENCY  PT (By licensed PT), OT (By licensed OT)     PT Frequency: 5x/week OT Frequency: 5x/week            Contractures Contractures  Info: Not present    Additional Factors Info  Code Status, Allergies Code Status Info: DNR Allergies Info: NKA           Current Medications (07/16/2022):  This is the current hospital active medication list Current Facility-Administered Medications  Medication Dose Route Frequency Provider Last Rate Last Admin   (feeding supplement) PROSource Plus liquid 30 mL  30 mL Oral BID BM Leroy Sea, MD   30 mL at 07/15/22 1343   acetaminophen (TYLENOL) tablet 650 mg  650 mg Oral Q6H PRN Gery Pray, MD       Or   acetaminophen (TYLENOL) suppository 650 mg  650 mg Rectal Q6H PRN Crosley, Debby, MD       Ampicillin-Sulbactam (UNASYN) 3 g in sodium chloride 0.9 % 100 mL IVPB  3 g Intravenous Q6H Susa Raring K, MD 200 mL/hr at 07/15/22 2257 3 g at 07/15/22 2257   doxycycline (VIBRA-TABS) tablet 100 mg  100 mg Oral Q12H Leroy Sea, MD   100 mg at 07/15/22 2045   enoxaparin (LOVENOX) injection 40 mg  40 mg Subcutaneous Q24H Crosley, Debby, MD   40 mg at 07/15/22 2045   fesoterodine (TOVIAZ) tablet 4 mg  4 mg Oral Daily Leroy Sea, MD   4 mg at 07/15/22 1747   metoprolol tartrate (LOPRESSOR) tablet 50 mg  50 mg Oral BID Gery Pray, MD   50  mg at 07/15/22 2045   pantoprazole (PROTONIX) EC tablet 40 mg  40 mg Oral Daily Leroy Sea, MD       polyvinyl alcohol (LIQUIFILM TEARS) 1.4 % ophthalmic solution 1 drop  1 drop Both Eyes Daily PRN Crosley, Debby, MD       pravastatin (PRAVACHOL) tablet 20 mg  20 mg Oral q1800 Leroy Sea, MD   20 mg at 07/15/22 1747   senna-docusate (Senokot-S) tablet 1 tablet  1 tablet Oral QHS PRN Gery Pray, MD         Discharge Medications: Please see discharge summary for a list of discharge medications.  Relevant Imaging Results:  Relevant Lab Results:   Additional Information SSN: [id number]  Renne Crigler Derionna Salvador, LCSW

## 2022-07-16 NOTE — Progress Notes (Addendum)
PROGRESS NOTE                                                                                                                                                                                                             Patient Demographics:    Sylvia Petersen, is a 89 y.o. female, DOB - 1929/07/04, ZOX:[id number]  Outpatient Primary MD for the patient is Sylvia Brunette, MD    LOS - 2  Admit date - 07/14/2022    Chief Complaint  Patient presents with   Wound Check       Brief Narrative (HPI from H&P)   frail, cachectic elderly 89 year old Caucasian female with past medical history HTN, H LD GERD.  Patient lives at home with caretakers.  Patient is here with complaints of a left heel ulcer that started a few weeks ago.  The pain has been increasing and she has had some increased drainage, she was also having some dysuria and cough, x-ray suggestive of possible right-sided pneumonia, x-ray foot also suggestive of possible right heel osteomyelitis.  She was admitted for further treatment of right heel ulcer, possible osteomyelitis and aspiration pneumonia.  She is extremely frail and cachectic at baseline.   Subjective:   Patient in bed, appears comfortable, denies any headache, no fever, no chest pain or pressure, no shortness of breath , no abdominal pain. No new focal weakness.   Assessment  & Plan :    Chronic L Heel ulcer with possible underlying early osteomyelitis.  All present on admission. She has minimal to no surrounding cellulitis, has been placed on oral doxycycline and Unasyn, MRSA nasal PCR is negative, follow cultures, wound care, orthopedics consulted Case discussed with Dr. Lajoyce Corners, no further imaging, local wound care and pro-form boots. DW son [name] - 07/16/22 she is DNR per her wishes, son confirms that, gentle medical treatment if further decline then full comfort measures.  Prepare for SNF.  Aspiration pneumonia  present on admission.  Placed on Unasyn.  Speech to follow, soft diet with feeding assistance and aspiration precautions.  Severe cachexia, frail and deconditioned.  Lives alone with some caregiver support.  PT, OT, speech eval, may require placement.  Protein supplements.  Possible UTI.  Follow cultures for now on Unasyn.  Hypertension.  Blood pressure soft.  Gentle hydration home dose beta-blocker hold Norvasc and lisinopril.  Dyslipidemia.  Continue statin.      Condition - Extremely Guarded  Family Communication  :  called and message left for son [name] [phone]  on 07/15/2022 at 9:40 AM, updated 07/16/22  Code Status :  DNR  Consults  :  Ortho   PUD Prophylaxis : PPI   Procedures  :          Disposition Plan  :    Status is: Inpatient   DVT Prophylaxis  :    enoxaparin (LOVENOX) injection 40 mg Start: 07/14/22 2200     Lab Results  Component Value Date   PLT 430 (H) 07/16/2022    Diet :  Diet Order             DIET DYS 3 Room service appropriate? Yes; Fluid consistency: Thin  Diet effective now                    Inpatient Medications  Scheduled Meds:  (feeding supplement) PROSource Plus  30 mL Oral BID BM   doxycycline  100 mg Oral Q12H   enoxaparin (LOVENOX) injection  40 mg Subcutaneous Q24H   fesoterodine  4 mg Oral Daily   metoprolol tartrate  50 mg Oral BID   pantoprazole  40 mg Oral Daily   pravastatin  20 mg Oral q1800   Continuous Infusions:  ampicillin-sulbactam (UNASYN) IV 3 g (07/15/22 2257)   PRN Meds:.acetaminophen **OR** acetaminophen, polyvinyl alcohol, senna-docusate   Objective:   Vitals:   07/15/22 1634 07/15/22 1955 07/15/22 2045 07/16/22 0400  BP: (!) 166/67 (!) 153/76 (!) 149/80   Pulse: 74 70 82   Resp: 14 17  20   Temp: 97.7 F (36.5 C) 97.7 F (36.5 C)  98.1 F (36.7 C)  TempSrc:  Axillary  Axillary  SpO2: 100% 98%      Wt Readings from Last 3 Encounters:  01/08/22 59.4 kg  02/11/20 53.1 kg   09/03/19 64 kg     Intake/Output Summary (Last 24 hours) at 07/16/2022 0739 Last data filed at 07/15/2022 1851 Gross per 24 hour  Intake 467.66 ml  Output 550 ml  Net -82.34 ml     Physical Exam  Extremely frail, cachectic, elderly Caucasian female lying in hospital bed in no distress but appears quite weak and lethargic, she is awake and alert, oriented x 2, No new F.N deficits, slow to respond, left heel ulcer under bandage [place].AT,PERRAL Supple Neck, No JVD,   Symmetrical Chest wall movement, Good air movement bilaterally, CTAB RRR,No Gallops,Rubs or new Murmurs,  +ve B.Sounds, Abd Soft, No tenderness,   No Cyanosis, Clubbing or edema        Data Review:    Recent Labs  Lab 07/14/22 1754 07/15/22 0309 07/16/22 0319  WBC 10.6* 8.8 8.7  HGB 9.7* 9.0* 10.2*  HCT 32.2* 28.9* 32.4*  PLT 402* 396 430*  MCV 96.1 93.8 94.5  MCH 29.0 29.2 29.7  MCHC 30.1 31.1 31.5  RDW 13.2 13.2 12.9  LYMPHSABS 1.8 1.7 1.4  MONOABS 0.6 0.7 0.6  EOSABS 0.2 0.3 0.3  BASOSABS 0.1 0.0 0.1    Recent Labs  Lab 07/14/22 1754 07/14/22 1802 07/14/22 2305 07/15/22 0309 07/15/22 0613 07/16/22 0319  NA 143  --   --  140  --   --   K 4.5  --   --  3.6  --   --   CL 111  --   --  115*  --   --  CO2 20*  --   --  19*  --   --   ANIONGAP 12  --   --  6  --   --   GLUCOSE 102*  --   --  92  --   --   BUN 49*  --   --  39*  --   --   CREATININE 1.02*  --   --  0.79  --   --   CRP  --   --   --   --  3.3* 2.3*  PROCALCITON  --   --   --   --  <0.10 <0.10  LATICACIDVEN  --  1.1 1.3  --   --   --   BNP  --   --   --   --   --  442.0*  MG  --   --   --  2.2  --  2.1  CALCIUM 9.7  --   --  9.0  --   --      Recent Labs  Lab 07/14/22 1754 07/14/22 1802 07/14/22 2305 07/15/22 0309 07/15/22 0613 07/16/22 0319  WBC 10.6*  --   --  8.8  --  8.7  PLT 402*  --   --  396  --  430*  CRP  --   --   --   --  3.3* 2.3*  PROCALCITON  --   --   --   --  <0.10 <0.10  LATICACIDVEN  --  1.1 1.3  --    --   --   CREATININE 1.02*  --   --  0.79  --   --       Radiology Reports DG Foot Complete Left  Result Date: 07/14/2022 CLINICAL DATA:  Heel wound EXAM: LEFT FOOT - COMPLETE 3 VIEW COMPARISON:  None Available. FINDINGS: Evidence of acute fracture or dislocation. Mild degenerative changes of the midfoot and IP joints. Vascular calcifications. Diffuse demineralization. Soft tissue ulceration of the heel. Mild cortical irregularity of the underlying calcaneus. IMPRESSION: Soft tissue ulceration of the heel. Mild cortical irregularity of the underlying calcaneus which could be due to osteomyelitis. Findings can be further evaluated with MRI. Electronically Signed   By: Allegra Lai M.D.   On: 07/14/2022 17:45   DG Chest Port 1 View  Result Date: 07/14/2022 CLINICAL DATA:  Wound on left foot. EXAM: PORTABLE CHEST 1 VIEW COMPARISON:  01/05/2022 FINDINGS: Stable cardiomediastinal silhouette. Aortic atherosclerotic calcification. Chronic bronchitic changes. Left basilar atelectasis or scarring. There are a few infiltrates in the right mid lung. No pleural effusion or pneumothorax. No acute osseous abnormality. IMPRESSION: 1. There are a few infiltrates in the right mid lung, likely infectious/inflammatory. Electronically Signed   By: Minerva Fester M.D.   On: 07/14/2022 17:45      Signature  -   Susa Raring M.D on 07/16/2022 at 7:39 AM   -  To page go to [email or web address]

## 2022-07-16 NOTE — TOC Initial Note (Signed)
Transition of Care [facility]) - Initial/Assessment Note    Patient Details  Name: Sylvia Petersen MRN: [id number] Date of Birth: Aug 05, 1929  [facility] CM/SW Contact:    Mearl Latin, LCSW Phone Number: 07/16/2022, 12:54 PM  Clinical Narrative:                 CSW received consult for possible SNF rehab at time of discharge. CSW spoke with patient's son, [name], who reported he and his brother Casimiro Needle share info with each other. Patient is currently requesting to return home instead of SNF and [name] stated that they want to do what they can to keep her at home as she does not like SNF stays and they do not offer enough assistance anyway. He stated that he will speak with his brother about if they are interested in Home health services. CSW discussed equipment needs and patient has a walker, wheelchair, lift chair, potty chair, shower chair. He reported the only thing they don't have is a hospital bed because the patient's current home aides stated it may be easier to use the bed she has currently but sons are open to whatever is recommended. CSW confirmed PCP and address. Will follow up on need for DME or HH.    Expected Discharge Plan: Home w Home Health Services Barriers to Discharge: Continued Medical Work up   Patient Goals and CMS Choice Patient states their goals for this hospitalization and ongoing recovery are:: Return home CMS Medicare.gov Compare Post Acute Care list provided to:: Patient Choice offered to / list presented to : Patient, Adult Children [place] ownership interest in [facility].provided to:: Adult Children    Expected Discharge Plan and Services In-house Referral: Clinical Social Work Discharge Planning Services: CM Consult Post Acute Care Choice: Home Health Living arrangements for the past 2 months: Single Family Home                                      Prior Living Arrangements/Services Living arrangements for the past 2  months: Single Family Home Lives with:: Self Patient language and need for interpreter reviewed:: Yes Do you feel safe going back to the place where you live?: Yes      Need for Family Participation in Patient Care: Yes (Comment) Care giver support system in place?: Yes (comment) Current home services: DME, Homehealth aide Criminal Activity/Legal Involvement Pertinent to Current Situation/Hospitalization: No - Comment as needed  Activities of Daily Living Home Assistive Devices/Equipment: Wheelchair, Environmental consultant (specify type) ADL Screening (condition at time of admission) Patient's cognitive ability adequate to safely complete daily activities?: Yes Is the patient deaf or have difficulty hearing?: No Does the patient have difficulty seeing, even when wearing glasses/contacts?: No Does the patient have difficulty concentrating, remembering, or making decisions?: No Patient able to express need for assistance with ADLs?: Yes Does the patient have difficulty dressing or bathing?: Yes Independently performs ADLs?: No Communication: Independent Dressing (OT): Dependent Is this a change from baseline?: Pre-admission baseline Grooming: Dependent Is this a change from baseline?: Pre-admission baseline Feeding: Needs assistance (set up) Is this a change from baseline?: Pre-admission baseline Bathing: Dependent Is this a change from baseline?: Pre-admission baseline Toileting: Dependent Is this a change from baseline?: Pre-admission baseline In/Out Bed: Dependent Is this a change from baseline?: Pre-admission baseline Walks in Home: Dependent Is this a change from baseline?: Pre-admission baseline Does the  patient have difficulty walking or climbing stairs?: Yes Weakness of Legs: Both Weakness of Arms/Hands: Both  Permission Sought/Granted Permission sought to share information with : Facility Medical sales representative, Family Supports Permission granted to share information with : Yes, Verbal  Permission Granted  Share Information with NAME: [name]     Permission granted to share info w Relationship: Son  Permission granted to share info w Contact Information: [phone]  Emotional Assessment Appearance:: Appears stated age Attitude/Demeanor/Rapport: Engaged Affect (typically observed): Accepting, Appropriate Orientation: : Oriented to Self, Oriented to Place, Oriented to  Time, Oriented to Situation Alcohol / Substance Use: Not Applicable Psych Involvement: No (comment)  Admission diagnosis:  Pneumonia due to infectious organism [J18.9] Community acquired pneumonia, unspecified laterality [J18.9] Patient Active Problem List   Diagnosis Date Noted   Chronic heel ulcer, left, limited to breakdown of skin (HCC) 07/15/2022   Pneumonia due to infectious organism 07/14/2022   UTI (urinary tract infection) 01/05/2022   SIRS (systemic inflammatory response syndrome) (HCC) 02/12/2020   ARF (acute renal failure) (HCC) 02/11/2020   Acute lower UTI 02/11/2020   Hypertensive urgency 02/11/2020   COVID 02/11/2020   Postoperative state 04/25/2016   PCP:  [name], MD Pharmacy:   [facility] - [place], [place] - [address] [place] [address] Phone: [phone] Fax: [phone]     Social Determinants of Health (SDOH) Social History: SDOH Screenings   Food Insecurity: No Food Insecurity (07/14/2022)  Housing: Low Risk  (07/14/2022)  Transportation Needs: No Transportation Needs (07/14/2022)  Utilities: Not At Risk (07/14/2022)  Tobacco Use: Low Risk  (07/14/2022)   SDOH Interventions:     Readmission Risk Interventions    01/11/2022   10:34 AM 01/08/2022   12:39 PM  Readmission Risk Prevention Plan  Post Dischage Appt Complete Complete  Medication Screening Complete Complete  Transportation Screening Complete Complete

## 2022-07-17 ENCOUNTER — Other Ambulatory Visit (HOSPITAL_COMMUNITY): Payer: Self-pay

## 2022-07-17 DIAGNOSIS — J189 Pneumonia, unspecified organism: Secondary | ICD-10-CM | POA: Diagnosis not present

## 2022-07-17 LAB — BASIC METABOLIC PANEL
Anion gap: 8 (ref 5–15)
BUN: 18 mg/dL (ref 8–23)
CO2: 19 mmol/L — ABNORMAL LOW (ref 22–32)
Calcium: 9.3 mg/dL (ref 8.9–10.3)
Chloride: 114 mmol/L — ABNORMAL HIGH (ref 98–111)
Creatinine, Ser: 0.71 mg/dL (ref 0.44–1.00)
GFR, Estimated: >60 mL/min (ref >60)
Glucose, Bld: 85 mg/dL (ref 70–99)
Potassium: 3.4 mmol/L — ABNORMAL LOW (ref 3.5–5.1)
Sodium: 141 mmol/L (ref 135–145)

## 2022-07-17 LAB — CBC WITH DIFFERENTIAL/PLATELET
Abs Immature Granulocytes: 0.04 10*3/uL (ref 0.00–0.07)
Basophils Absolute: 0 10*3/uL (ref 0.0–0.1)
Basophils Relative: 0 %
Eosinophils Absolute: 0.3 10*3/uL (ref 0.0–0.5)
Eosinophils Relative: 3 %
HCT: 31.1 % — ABNORMAL LOW (ref 36.0–46.0)
Hemoglobin: 10.1 g/dL — ABNORMAL LOW (ref 12.0–15.0)
Immature Granulocytes: 0 %
Lymphocytes Relative: 19 %
Lymphs Abs: 1.9 10*3/uL (ref 0.7–4.0)
MCH: 29.7 pg (ref 26.0–34.0)
MCHC: 32.5 g/dL (ref 30.0–36.0)
MCV: 91.5 fL (ref 80.0–100.0)
Monocytes Absolute: 0.7 10*3/uL (ref 0.1–1.0)
Monocytes Relative: 7 %
Neutro Abs: 6.7 10*3/uL (ref 1.7–7.7)
Neutrophils Relative %: 71 %
Platelets: 423 10*3/uL — ABNORMAL HIGH (ref 150–400)
RBC: 3.4 MIL/uL — ABNORMAL LOW (ref 3.87–5.11)
RDW: 12.9 % (ref 11.5–15.5)
WBC: 9.6 10*3/uL (ref 4.0–10.5)
nRBC: 0 % (ref 0.0–0.2)

## 2022-07-17 LAB — PROCALCITONIN: Procalcitonin: <0.1 ng/mL

## 2022-07-17 LAB — BRAIN NATRIURETIC PEPTIDE: B Natriuretic Peptide: 299.1 pg/mL — ABNORMAL HIGH (ref 0.0–100.0)

## 2022-07-17 LAB — C-REACTIVE PROTEIN: CRP: 2.7 mg/dL — ABNORMAL HIGH (ref <1.0)

## 2022-07-17 LAB — LEGIONELLA PNEUMOPHILA SEROGP 1 UR AG: L. pneumophila Serogp 1 Ur Ag: NEGATIVE

## 2022-07-17 LAB — MAGNESIUM: Magnesium: 2 mg/dL (ref 1.7–2.4)

## 2022-07-17 MED ORDER — PANTOPRAZOLE SODIUM 40 MG PO TBEC
40.0000 mg | DELAYED_RELEASE_TABLET | Freq: Every day | ORAL | 0 refills | Status: AC
  Filled 2022-07-17: qty 30, 30d supply, fill #0

## 2022-07-17 MED ORDER — AMLODIPINE BESYLATE 10 MG PO TABS
10.0000 mg | ORAL_TABLET | Freq: Every morning | ORAL | 0 refills | Status: AC
  Filled 2022-07-17: qty 30, 30d supply, fill #0

## 2022-07-17 MED ORDER — AMOXICILLIN-POT CLAVULANATE 500-125 MG PO TABS
1.0000 | ORAL_TABLET | Freq: Three times a day (TID) | ORAL | 0 refills | Status: AC
  Filled 2022-07-17: qty 15, 5d supply, fill #0

## 2022-07-17 MED ORDER — POTASSIUM CHLORIDE 20 MEQ PO PACK
40.0000 meq | PACK | Freq: Once | ORAL | Status: AC
  Administered 2022-07-17: 40 meq via ORAL
  Filled 2022-07-17: qty 2

## 2022-07-17 MED ORDER — AMLODIPINE BESYLATE 10 MG PO TABS
10.0000 mg | ORAL_TABLET | Freq: Every day | ORAL | Status: DC
  Administered 2022-07-17: 10 mg via ORAL
  Filled 2022-07-17: qty 1

## 2022-07-17 MED ORDER — DOXYCYCLINE HYCLATE 100 MG PO TABS
100.0000 mg | ORAL_TABLET | Freq: Two times a day (BID) | ORAL | 0 refills | Status: AC
  Filled 2022-07-17: qty 10, 5d supply, fill #0

## 2022-07-17 NOTE — TOC Transition Note (Addendum)
Transition of Care [facility]) - CM/SW Discharge Note   Patient Details  Name: Sylvia Petersen MRN: [id number] Date of Birth: 30-Aug-1929  [facility] CM/SW Contact:  Mearl Latin, LCSW Phone Number: 07/17/2022, 11:01 AM   Clinical Narrative:    Patient will DC to: Home Anticipated DC date: 07/17/22 Family notified: Son, [name] Transport by: Hermina Barters Son to meet patient at her house.   Per MD patient ready for DC to Home. RN, patient, patient's family notified of DC. DC packet on chart. Ambulance transport requested for patient.   CSW will sign off for now as social work intervention is no longer needed. Please consult [place] again if new needs arise.     Final next level of care: Home/Self Care Barriers to Discharge: Barriers Resolved   Patient Goals and CMS Choice CMS Medicare.gov Compare Post Acute Care list provided to:: Patient Choice offered to / list presented to : Patient, Adult Children  Discharge Placement                  Patient to be transferred to facility by: [facility] Name of family member notified: Son Patient and family notified of of transfer: 07/17/22  Discharge Plan and Services Additional resources added to the After Visit Summary for   In-house Referral: Clinical Social Work Discharge Planning Services: CM Consult Post Acute Care Choice: Home Health                               Social Determinants of Health (SDOH) Interventions SDOH Screenings   Food Insecurity: No Food Insecurity (07/14/2022)  Housing: Low Risk  (07/14/2022)  Transportation Needs: No Transportation Needs (07/14/2022)  Utilities: Not At Risk (07/14/2022)  Tobacco Use: Low Risk  (07/14/2022)     Readmission Risk Interventions    01/11/2022   10:34 AM 01/08/2022   12:39 PM  Readmission Risk Prevention Plan  Post Dischage Appt Complete Complete  Medication Screening Complete Complete  Transportation Screening Complete Complete

## 2022-07-17 NOTE — Discharge Summary (Signed)
Sylvia Petersen ZOX:[id number] DOB: 06/09/1929 DOA: 07/14/2022  PCP: [name], MD  Admit date: 07/14/2022  Discharge date: 07/17/2022  Admitted From: Home   Disposition:  Home - family refused SNF   Recommendations for Outpatient Follow-up:   Follow up with PCP in 1-2 weeks  PCP Please obtain BMP/CBC, 2 view CXR in 1week,  (see Discharge instructions)   PCP Please follow up on the following pending results:    Home Health: PT, RN, speech, social work if she qualifies Equipment/Devices: none  Consultations: Ortho Discharge Condition: Guarded    CODE STATUS: DNR  Diet Recommendation: Dysphagia 3 diet with full feeding assistance and aspiration precautions.  Chief Complaint  Patient presents with   Wound Check     Brief history of present illness from the day of admission and additional interim summary    Frail, cachectic elderly 89 year old Caucasian female with past medical history HTN, H LD GERD. Patient lives at home with caretakers. Patient is here with complaints of a left heel ulcer that started a few weeks ago. The pain has been increasing and she has had some increased drainage, she was also having some dysuria and cough, x-ray suggestive of possible right-sided pneumonia, x-ray foot also suggestive of possible right heel osteomyelitis. She was admitted for further treatment of right heel ulcer, possible osteomyelitis and aspiration pneumonia. She is extremely frail and cachectic at baseline.                                                                  Hospital Course   Chronic L Heel ulcer with possible underlying early osteomyelitis.  All present on admission. She has minimal to no surrounding cellulitis, has been placed on oral doxycycline and Unasyn, MRSA nasal PCR is negative, follow cultures,  wound care, orthopedics consulted Case discussed with Dr. Lajoyce Corners, no further imaging, local wound care and pro-form boots. DW son [name] - 07/16/22 she is DNR per her wishes, son confirms that, gentle medical treatment if further decline then full comfort measures.  Family did not want SNF and wants to take her home with caregivers, this will be arranged.   Aspiration pneumonia present on admission.  Seen by speech placed on dysphagia 3 diet, extremely weak and deconditioned chances of future aspiration very high, will be discharged home with dysphagia 3 diet, speech therapy follow-up, 5 more days of oral antibiotics.   Severe cachexia, frail and deconditioned.  Lives alone with some caregiver support.  PT, OT, speech eval, done here and will be ordered for home, family declined SNF which I think she needs, for now DNR and discharged home with best care at home as possible.   Possible UTI.  Stable responded well to empiric antibiotics will get 5 more days upon discharge.   Hypertension.  Blood pressure was soft on admission now improving placed on beta-blocker and Norvasc, ACE inhibitor discontinued as she is at risk for AKI.   Dyslipidemia.  Continue statin.    Discharge diagnosis     Principal Problem:   Pneumonia due to infectious organism Active Problems:   UTI (urinary tract infection)   Chronic heel ulcer, left, limited to breakdown of skin [facility])    Discharge instructions    Discharge Instructions     Diet - low sodium heart healthy   Complete by: As directed    Discharge instructions   Complete by: As directed    Follow with Primary MD [name], MD in 7 days   Get CBC, CMP, 2 view Chest X ray -  checked next visit with your primary MD   Activity: As tolerated with Full fall precautions use walker/cane & assistance as needed  Disposition Home    Diet: Dysphagia 3 diet with feeding assistance and aspiration precautions.  All diet while sitting up preferably in chair, very  high risk for aspiration.  Special Instructions: If you have smoked or chewed Tobacco  in the last 2 yrs please stop smoking, stop any regular Alcohol  and or any Recreational drug use.  On your next visit with your primary care physician please Get Medicines reviewed and adjusted.  Please request your Prim.MD to go over all Hospital Tests and Procedure/Radiological results at the follow up, please get all Hospital records sent to your Prim MD by signing hospital release before you go home.  If you experience worsening of your admission symptoms, develop shortness of breath, life threatening emergency, suicidal or homicidal thoughts you must seek medical attention immediately by calling 911 or calling your MD immediately  if symptoms less severe.  You Must read complete instructions/literature along with all the possible adverse reactions/side effects for all the Medicines you take and that have been prescribed to you. Take any new Medicines after you have completely understood and accpet all the possible adverse reactions/side effects.   Discharge wound care:   Complete by: As directed    Wound care to left medial heel:  Cleanse with soap and water, rinse and dry. Cover with xeroform gauze [place] # [id number]), top with silicone foam. Change xeroform daily, may change silicone foam every 3 days, but change PRN soiling.   Increase activity slowly   Complete by: As directed        Discharge Medications   Allergies as of 07/17/2022   No Known Allergies      Medication List     STOP taking these medications    lisinopril 20 MG tablet Commonly known as: ZESTRIL   tamsulosin 0.4 MG Caps capsule Commonly known as: FLOMAX       TAKE these medications    acetaminophen 500 MG tablet Commonly known as: TYLENOL Take 1,000 mg by mouth every 6 (six) hours as needed for mild pain.   amLODipine 10 MG tablet Commonly known as: NORVASC Take 1 tablet (10 mg total) by mouth every morning. What  changed:  medication strength how much to take   amoxicillin-clavulanate 500-125 MG tablet Commonly known as: Augmentin Take 1 tablet by mouth 3 (three) times daily.   CLEAR EYES FOR DRY EYES OP Apply 1 drop to eye daily as needed (for dry eyes).   doxycycline 100 MG tablet Commonly known as: VIBRA-TABS Take 1 tablet (100 mg total) by mouth every 12 (twelve) hours.   Fish Oil 1000 MG Caps  Take 1,000 mg by mouth daily.   lovastatin 20 MG tablet Commonly known as: MEVACOR Take 20 mg by mouth daily.   metoprolol tartrate 50 MG tablet Commonly known as: LOPRESSOR Take 50 mg by mouth 2 (two) times daily.   multivitamin with minerals Tabs tablet Take 1 tablet by mouth daily.   pantoprazole 40 MG tablet Commonly known as: Protonix Take 1 tablet (40 mg total) by mouth daily. What changed:  when to take this reasons to take this   Prevagen Extra Strength 20 MG Caps Generic drug: Apoaequorin Take 40 mg by mouth daily.   solifenacin 10 MG tablet Commonly known as: VESICARE Take 10 mg by mouth at bedtime.               Discharge Care Instructions  (From admission, onward)           Start     Ordered   07/17/22 0000  Discharge wound care:       Comments: Wound care to left medial heel:  Cleanse with soap and water, rinse and dry. Cover with xeroform gauze [place] # [id number]), top with silicone foam. Change xeroform daily, may change silicone foam every 3 days, but change PRN soiling.   07/17/22 0840             Follow-up Information     Nadara Mustard, MD Follow up in 1 week(s).   Specialty: Orthopedic Surgery Contact information: [address] [place] [address] [phone]         [name], MD. Schedule an appointment as soon as possible for a visit in 1 week(s).   Specialty: Family Medicine Contact information: [address], [address] [place] [address] [phone]                 Major procedures and Radiology  Reports - PLEASE review detailed and final reports thoroughly  -       DG Foot Complete Left  Result Date: 07/14/2022 CLINICAL DATA:  Heel wound EXAM: LEFT FOOT - COMPLETE 3 VIEW COMPARISON:  None Available. FINDINGS: Evidence of acute fracture or dislocation. Mild degenerative changes of the midfoot and IP joints. Vascular calcifications. Diffuse demineralization. Soft tissue ulceration of the heel. Mild cortical irregularity of the underlying calcaneus. IMPRESSION: Soft tissue ulceration of the heel. Mild cortical irregularity of the underlying calcaneus which could be due to osteomyelitis. Findings can be further evaluated with MRI. Electronically Signed   By: Allegra Lai M.D.   On: 07/14/2022 17:45   DG Chest Port 1 View  Result Date: 07/14/2022 CLINICAL DATA:  Wound on left foot. EXAM: PORTABLE CHEST 1 VIEW COMPARISON:  01/05/2022 FINDINGS: Stable cardiomediastinal silhouette. Aortic atherosclerotic calcification. Chronic bronchitic changes. Left basilar atelectasis or scarring. There are a few infiltrates in the right mid lung. No pleural effusion or pneumothorax. No acute osseous abnormality. IMPRESSION: 1. There are a few infiltrates in the right mid lung, likely infectious/inflammatory. Electronically Signed   By: Minerva Fester M.D.   On: 07/14/2022 17:45    Micro Results    Recent Results (from the past 240 hour(s))  Culture, blood (Routine X 2) w Reflex to ID Panel     Status: None (Preliminary result)   Collection Time: 07/14/22  6:34 PM   Specimen: BLOOD RIGHT FOREARM  Result Value Ref Range Status   Specimen Description BLOOD RIGHT FOREARM  Final   Special Requests   Final    BOTTLES DRAWN AEROBIC AND ANAEROBIC Blood  Culture results may not be optimal due to an inadequate volume of blood received in culture bottles   Culture   Final    NO GROWTH 2 DAYS Performed at [facility], [address] [place], [place] [address]    Report Status PENDING  Incomplete   Culture, blood (Routine X 2) w Reflex to ID Panel     Status: None (Preliminary result)   Collection Time: 07/14/22  8:05 PM   Specimen: BLOOD RIGHT FOREARM  Result Value Ref Range Status   Specimen Description BLOOD RIGHT FOREARM  Final   Special Requests   Final    BOTTLES DRAWN AEROBIC AND ANAEROBIC Blood Culture adequate volume   Culture   Final    NO GROWTH 2 DAYS Performed at [facility], [address] [place], [place] [address]    Report Status PENDING  Incomplete  Respiratory (~20 pathogens) panel by PCR     Status: None   Collection Time: 07/14/22  9:45 PM   Specimen: Nasopharyngeal Swab; Respiratory  Result Value Ref Range Status   Adenovirus NOT DETECTED NOT DETECTED Final   Coronavirus 229E NOT DETECTED NOT DETECTED Final    Comment: (NOTE) The Coronavirus on the Respiratory Panel, DOES NOT test for the novel  Coronavirus (2019 nCoV)    Coronavirus HKU1 NOT DETECTED NOT DETECTED Final   Coronavirus NL63 NOT DETECTED NOT DETECTED Final   Coronavirus OC43 NOT DETECTED NOT DETECTED Final   Metapneumovirus NOT DETECTED NOT DETECTED Final   Rhinovirus / Enterovirus NOT DETECTED NOT DETECTED Final   Influenza A NOT DETECTED NOT DETECTED Final   Influenza B NOT DETECTED NOT DETECTED Final   Parainfluenza Virus 1 NOT DETECTED NOT DETECTED Final   Parainfluenza Virus 2 NOT DETECTED NOT DETECTED Final   Parainfluenza Virus 3 NOT DETECTED NOT DETECTED Final   Parainfluenza Virus 4 NOT DETECTED NOT DETECTED Final   Respiratory Syncytial Virus NOT DETECTED NOT DETECTED Final   Bordetella pertussis NOT DETECTED NOT DETECTED Final   Bordetella Parapertussis NOT DETECTED NOT DETECTED Final   Chlamydophila pneumoniae NOT DETECTED NOT DETECTED Final   Mycoplasma pneumoniae NOT DETECTED NOT DETECTED Final    Comment: Performed at [facility], [address], [place], [place] [address]  MRSA Next Gen by PCR, Nasal     Status: None   Collection Time: 07/15/22 11:56 AM    Specimen: Nasal Mucosa; Nasal Swab  Result Value Ref Range Status   MRSA by PCR Next Gen NOT DETECTED NOT DETECTED Final    Comment: (NOTE) The GeneXpert MRSA Assay (FDA approved for NASAL specimens only), is one component of a comprehensive MRSA colonization surveillance program. It is not intended to diagnose MRSA infection nor to guide or monitor treatment for MRSA infections. Test performance is not FDA approved in patients less than 67 years old. Performed at [facility], [address], [place], [place] [address]     Today   Subjective    Sylvia Petersen today has no headache,no chest abdominal pain,no new weakness tingling or numbness, feels much better wants to go home today.    Objective   Blood pressure (!) 156/85, pulse 71, temperature 98.2 F (36.8 C), temperature source Oral, resp. rate 16, SpO2 98 %.   Intake/Output Summary (Last 24 hours) at 07/17/2022 0841 Last data filed at 07/17/2022 0400 Gross per 24 hour  Intake 100 ml  Output 200 ml  Net -100 ml    Exam  Frail cachectic elderly Caucasian female, awake Alert, No new F.N deficits,    [place].AT,PERRAL Supple Neck,   Symmetrical Chest wall movement, Good air movement bilaterally, CTAB RRR,No Gallops,   +ve B.Sounds, Abd Soft, Non tender,  No Cyanosis, bilateral lower extremities in pro-form boots   Data Review   Recent Labs  Lab 07/14/22 1754 07/15/22 0309 07/16/22 0319 07/17/22 0409  WBC 10.6* 8.8 8.7 9.6  HGB 9.7* 9.0* 10.2* 10.1*  HCT 32.2* 28.9* 32.4* 31.1*  PLT 402* 396 430* 423*  MCV 96.1 93.8 94.5 91.5  MCH 29.0 29.2 29.7 29.7  MCHC 30.1 31.1 31.5 32.5  RDW 13.2 13.2 12.9 12.9  LYMPHSABS 1.8 1.7 1.4 1.9  MONOABS 0.6 0.7 0.6 0.7  EOSABS 0.2 0.3 0.3 0.3  BASOSABS 0.1 0.0 0.1 0.0    Recent Labs  Lab 07/14/22 1754 07/14/22 1802 07/14/22 2305 07/15/22 0309 07/15/22 0613 07/16/22 0319 07/17/22 0409  NA 143  --   --  140  --   --  141  K 4.5  --   --  3.6  --   --  3.4*   CL 111  --   --  115*  --   --  114*  CO2 20*  --   --  19*  --   --  19*  ANIONGAP 12  --   --  6  --   --  8  GLUCOSE 102*  --   --  92  --   --  85  BUN 49*  --   --  39*  --   --  18  CREATININE 1.02*  --   --  0.79  --   --  0.71  CRP  --   --   --   --  3.3* 2.3* 2.7*  PROCALCITON  --   --   --   --  <0.10 <0.10 <0.10  LATICACIDVEN  --  1.1 1.3  --   --   --   --   BNP  --   --   --   --   --  442.0* 299.1*  MG  --   --   --  2.2  --  2.1 2.0  CALCIUM 9.7  --   --  9.0  --   --  9.3    Total Time in preparing paper work, data evaluation and todays exam - 35 minutes  Signature  -    Susa Raring M.D on 07/17/2022 at 8:41 AM   -  To page go to [email or web address]

## 2022-07-17 NOTE — Care Management Important Message (Signed)
Important Message  Patient Details  Name: Sylvia Petersen MRN: [id number] Date of Birth: [birth year]   Medicare Important Message Given:  Yes     Kadon Andrus 07/17/2022, 1:13 PM

## 2022-07-17 NOTE — TOC Progression Note (Addendum)
Transition of Care [facility]) - Progression Note    Patient Details  Name: Sylvia Petersen MRN: [id number] Date of Birth: 07/24/1929  [facility] CM/SW Contact  Mearl Latin, LCSW Phone Number: 07/17/2022, 9:09 AM  Clinical Narrative:    9am-CSW contacted patient's son, Sylvia Petersen. He reported they do not need home health services as therapies have not worked for patient in the past and she can't bear weight on her leg anyway. He feels her current home services will be sufficient and that includes a nurse that comes out for her foot. No DME needed. He stated it is difficult to get patient out of the house by car. CSW completing an [employer] [place] application for transportation assistance; son aware. He requests ambulance for transport home. CSW contacted [employer] to request prior approval for PTAR transport; awaiting decision.   11am-Insurance approval received for PTAR ride home, Auth [id number].   Expected Discharge Plan: Home/Self Care Barriers to Discharge: Transportation (insruance [facility] for ptar home)  Expected Discharge Plan and Services In-house Referral: Clinical Social Work Discharge Planning Services: CM Consult Post Acute Care Choice: Home Health Living arrangements for the past 2 months: Single Family Home Expected Discharge Date: 07/17/22                                     Social Determinants of Health (SDOH) Interventions SDOH Screenings   Food Insecurity: No Food Insecurity (07/14/2022)  Housing: Low Risk  (07/14/2022)  Transportation Needs: No Transportation Needs (07/14/2022)  Utilities: Not At Risk (07/14/2022)  Tobacco Use: Low Risk  (07/14/2022)    Readmission Risk Interventions    01/11/2022   10:34 AM 01/08/2022   12:39 PM  Readmission Risk Prevention Plan  Post Dischage Appt Complete Complete  Medication Screening Complete Complete  Transportation Screening Complete Complete

## 2022-07-17 NOTE — Discharge Instructions (Signed)
Follow with Primary MD [name], MD in 7 days   Get CBC, CMP, 2 view Chest X ray -  checked next visit with your primary MD   Activity: As tolerated with Full fall precautions use walker/cane & assistance as needed  Disposition Home    Diet: Dysphagia 3 diet with feeding assistance and aspiration precautions.  All diet while sitting up preferably in chair, very high risk for aspiration.  Special Instructions: If you have smoked or chewed Tobacco  in the last 2 yrs please stop smoking, stop any regular Alcohol  and or any Recreational drug use.  On your next visit with your primary care physician please Get Medicines reviewed and adjusted.  Please request your Prim.MD to go over all Hospital Tests and Procedure/Radiological results at the follow up, please get all Hospital records sent to your Prim MD by signing hospital release before you go home.  If you experience worsening of your admission symptoms, develop shortness of breath, life threatening emergency, suicidal or homicidal thoughts you must seek medical attention immediately by calling 911 or calling your MD immediately  if symptoms less severe.  You Must read complete instructions/literature along with all the possible adverse reactions/side effects for all the Medicines you take and that have been prescribed to you. Take any new Medicines after you have completely understood and accpet all the possible adverse reactions/side effects.

## 2022-07-17 NOTE — Plan of Care (Signed)
  Problem: Education: Goal: Knowledge of General Education information will improve Description: Including pain rating scale, medication(s)/side effects and non-pharmacologic comfort measures Outcome: Progressing   Problem: Clinical Measurements: Goal: Respiratory complications will improve Outcome: Progressing Goal: Cardiovascular complication will be avoided Outcome: Progressing   Problem: Nutrition: Goal: Adequate nutrition will be maintained Outcome: Progressing   Problem: Elimination: Goal: Will not experience complications related to bowel motility Outcome: Progressing   Problem: Pain Managment: Goal: General experience of comfort will improve Outcome: Progressing   Problem: Safety: Goal: Ability to remain free from injury will improve Outcome: Progressing

## 2022-07-19 LAB — CULTURE, BLOOD (ROUTINE X 2)
Culture: NO GROWTH
Culture: NO GROWTH
Special Requests: ADEQUATE

## 2023-06-11 DEATH — deceased
